# Patient Record
Sex: Male | Born: 1985 | Hispanic: Yes | Marital: Single | Smoking: Never smoker
Health system: Southern US, Community
[De-identification: ages and names within clinical notes are randomized; demographics above are authoritative.]

## PROBLEM LIST (undated history)

## (undated) DIAGNOSIS — I10 Essential (primary) hypertension: Secondary | ICD-10-CM

## (undated) HISTORY — DX: Essential (primary) hypertension: I10

---

## 2011-02-16 HISTORY — PX: CHOLECYSTECTOMY: SHX55

## 2012-10-10 ENCOUNTER — Encounter (INDEPENDENT_AMBULATORY_CARE_PROVIDER_SITE_OTHER): Payer: Self-pay | Admitting: Surgery

## 2013-08-22 ENCOUNTER — Encounter: Payer: Self-pay | Admitting: *Deleted

## 2013-08-22 ENCOUNTER — Ambulatory Visit (INDEPENDENT_AMBULATORY_CARE_PROVIDER_SITE_OTHER): Payer: Self-pay | Admitting: Family Medicine

## 2013-08-22 VITALS — BP 114/72 | HR 66 | Temp 97.6°F | Resp 16 | Ht 70.5 in | Wt 165.0 lb

## 2013-08-22 DIAGNOSIS — I10 Essential (primary) hypertension: Secondary | ICD-10-CM | POA: Insufficient documentation

## 2013-08-22 MED ORDER — BISOPROLOL-HYDROCHLOROTHIAZIDE 5-6.25 MG PO TABS: 1.0000 | ORAL_TABLET | Freq: Every day | ORAL | Status: DC

## 2013-08-22 NOTE — Progress Notes (Signed)
[facility] [address], [place] [address] [phone]  Date:  08/22/2013   Name:  Garrett Miller   DOB:  Dec 13, 1985   MRN:  [id number]  PCP:  No PCP Per Patient    Chief Complaint: rx refills   History of Present Illness:  Garrett Miller is a 28 y.o. very pleasant male patient who presents with the following:  He needs a rf of his BP medication- he has been on it for about 3 years.  He does not really check his BP at home. He has been out for about 3 days now, but he still feels like it is working ok.   He does not really have a doctor.  No recent BW  There are no active problems to display for this patient.   Past Medical History  Diagnosis Date  . Hypertension     Past Surgical History  Procedure Laterality Date  . Cholecystectomy      History  Substance Use Topics  . Smoking status: Never Smoker   . Smokeless tobacco: Not on file  . Alcohol Use: No    Family History  Problem Relation Age of Onset  . Hypertension Mother     No Known Allergies  Medication list has been reviewed and updated.  No current outpatient prescriptions on file prior to visit.   No current facility-administered medications on file prior to visit.    Review of Systems:  As per HPI- otherwise negative. Mother with history of HTN  Physical Examination: Filed Vitals:   08/22/13 1557  BP: 114/72  Pulse: 66  Temp: 97.6 F (36.4 C)  Resp: 16   Filed Vitals:   08/22/13 1557  Height: 5' 10.5" (1.791 m)  Weight: 165 lb (74.844 kg)   Body mass index is 23.33 kg/(m^2). Ideal Body Weight: Weight in (lb) to have BMI = 25: 176.4  GEN: WDWN, NAD, Non-toxic, A & O x 3 HEENT: Atraumatic, Normocephalic. Neck supple. No masses, No LAD. Ears and Nose: No external deformity. CV: RRR, No M/G/R. No JVD. No thrill. No extra heart sounds. PULM: CTA B, no wheezes, crackles, rhonchi. No retractions. No resp. distress. No accessory muscle use. EXTR: No c/c/e NEURO  Normal gait.  PSYCH: Normally interactive. Conversant. Not depressed or anxious appearing.  Calm demeanor.    Assessment and Plan: Essential hypertension - Plan: bisoprolol-hydrochlorothiazide (ZIAC) 5-6.25 MG per tablet  Refilled his HTN medication.  Per his report he will have health insurance later on this month- defer labs, but he will come back soon  Signed Abbe AmsterdamJessica Copland, MD

## 2013-08-22 NOTE — Patient Instructions (Signed)
Follow- up for a visit in 2 months or so- we can do labs then and check on your BP

## 2013-09-16 ENCOUNTER — Ambulatory Visit (INDEPENDENT_AMBULATORY_CARE_PROVIDER_SITE_OTHER): Payer: BC Managed Care – PPO

## 2013-09-16 ENCOUNTER — Ambulatory Visit (INDEPENDENT_AMBULATORY_CARE_PROVIDER_SITE_OTHER): Payer: BC Managed Care – PPO | Admitting: Emergency Medicine

## 2013-09-16 VITALS — BP 112/70 | HR 54 | Temp 97.6°F | Resp 20 | Ht 70.5 in | Wt 166.0 lb

## 2013-09-16 DIAGNOSIS — R079 Chest pain, unspecified: Secondary | ICD-10-CM

## 2013-09-16 DIAGNOSIS — I10 Essential (primary) hypertension: Secondary | ICD-10-CM

## 2013-09-16 MED ORDER — NAPROXEN SODIUM 550 MG PO TABS: 550.0000 mg | ORAL_TABLET | Freq: Two times a day (BID) | ORAL | Status: DC

## 2013-09-16 NOTE — Progress Notes (Signed)
[facility] [address], [place] [address] [phone]  Date:  09/16/2013   Name:  Garrett Miller   DOB:  14-Jul-1985   MRN:  [id number]  PCP:  No PCP Per Patient    Chief Complaint: Chest Pain and Back Pain   History of Present Illness:  Garrett Primassteban Joa is a 28 y.o. very pleasant male patient who presents with the following:  No history of injury.  Has 1 week duration pain in right chest both anteriorly and posteriorly.  Worse with movement of his arm.  No cough or hemoptysis.  No fever or chills, wheezing or shortness of breath.  No palpitations or tachycardia. No radiation of pain No nausea or vomiting.   No improvement with over the counter medications or other home remedies. Fredna Dow.  Says a doctor told him bistolic would "cause" a heart attack. Denies other complaint or health concern today.   Patient Active Problem List   Diagnosis Date Noted  . HTN (hypertension) 08/22/2013    Past Medical History  Diagnosis Date  . Hypertension     Past Surgical History  Procedure Laterality Date  . Cholecystectomy      History  Substance Use Topics  . Smoking status: Never Smoker   . Smokeless tobacco: Never Used  . Alcohol Use: No    Family History  Problem Relation Age of Onset  . Hypertension Mother     No Known Allergies  Medication list has been reviewed and updated.  Current Outpatient Prescriptions on File Prior to Visit  Medication Sig Dispense Refill  . bisoprolol-hydrochlorothiazide (ZIAC) 5-6.25 MG per tablet Take 1 tablet by mouth daily.  90 tablet  3   No current facility-administered medications on file prior to visit.    Review of Systems:  As per HPI, otherwise negative.    Physical Examination: Filed Vitals:   09/16/13 1607  BP: 112/70  Pulse: 54  Temp: 97.6 F (36.4 C)  Resp: 20   Filed Vitals:   09/16/13 1607  Height: 5' 10.5" (1.791 m)  Weight: 166 lb (75.297 kg)   Body mass index is 23.47 kg/(m^2). Ideal Body  Weight: Weight in (lb) to have BMI = 25: 176.4   GEN: WDWN, NAD, Non-toxic, Alert & Oriented x 3 HEENT: Atraumatic, Normocephalic.  Ears and Nose: No external deformity. EXTR: No clubbing/cyanosis/edema NEURO: Normal gait.  PSYCH: Normally interactive. Conversant. Not depressed or anxious appearing.  Calm demeanor.  CHEST:  Little tenderness.  No ecchymosis. LUNG:  Clear BS =  Assessment and Plan: Chest pain likely musculoskeletal Normal EKG  Signed,  Phillips OdorJeffery Tykeem Lanzer, MD  [facility] reading (PRIMARY) by  Dr. Dareen PianoAnderson.  Negative chest.

## 2013-09-16 NOTE — Patient Instructions (Signed)
Dolor de pecho (no específico) °(Chest Pain (Nonspecific)) °Con frecuencia es difícil dar un diagnóstico específico de la causa del dolor de pecho. Siempre hay una posibilidad de que el dolor podría estar relacionado con algo grave, como un ataque al corazón o un coágulo sanguíneo en los pulmones. Debe someterse a controles con el médico para más evaluaciones. °CAUSAS  °· Acidez. °· Neumonía o bronquitis. °· Ansiedad o estrés. °· Inflamación de la zona que rodea al corazón (pericarditis) o a los pulmones (pleuritis o pleuresía). °· Un coágulo sanguíneo en el pulmón. °· Colapso de un pulmón (neumotórax), que puede aparecer de manera repentina por sí solo (neumotórax espontáneo) o debido a un traumatismo en el tórax. °· Culebrilla (virus del herpes zóster). °La pared torácica está compuesta por huesos, músculos y cartílago. Cualquiera de estos puede ser la fuente del dolor. °· Puede haber una contusión en los huesos debido a una lesión. °· Puede haber un esguince en los músculos o el cartílago ocasionado por la tos o por trabajo excesivo. °· El cartílago puede verse afectado por una inflamación y provocar dolor (costocondritis). °DIAGNÓSTICO  °Quizás se necesiten análisis de laboratorio u otros estudios para encontrar la causa del dolor. Además, puede indicarle que se haga una prueba llamada electrocadiograma (ECG) ambulatorio. El ECG registra los patrones de los latidos cardíacos durante 24 horas. Además, pueden hacerle otros estudios, por ejemplo: °· Ecocardiograma transtorácico (ETT). Durante el ecocardiograma, se usan ondas sonoras para evaluar el flujo de la sangre a través del corazón. °· Ecocardiograma transesofágico (ETE). °· Monitoreo cardíaco. Permite que el médico controle la frecuencia y el ritmo cardíaco en tiempo real. °· Monitor Holter. Es un dispositivo portátil que registra los latidos cardíacos y ayuda a diagnosticar las arritmias cardíacas. Le permite al médico registrar la actividad cardíaca  durante varios días, si es necesario. °· Pruebas de estrés por ejercicio o por medicamentos que aceleran los latidos cardíacos. °TRATAMIENTO  °· El tratamiento depende de la causa del dolor de pecho. El tratamiento puede incluir: °¨ Inhibidores de la acidez estomacal. °¨ Antiinflamatorios. °¨ Analgésicos para las enfermedades inflamatorias. °¨ Antibióticos, si hay una infección. °· Podrán aconsejarle que modifique su estilo de vida. Esto incluye dejar de fumar y evitar el alcohol, la cafeína y el chocolate. °· Pueden aconsejarle que mantenga la cabeza levantada (elevada) cuando duerme. Esto reduce la probabilidad de que el ácido retroceda del estómago al esófago. °En la mayoría de los casos, el dolor de pecho no específico mejorará en el término de 2 a 3 días, con reposo y analgésicos suaves.  °INSTRUCCIONES PARA EL CUIDADO EN EL HOGAR  °· Si le prescriben antibióticos, tómelos tal como se le indicó. Termínelos aunque comience a sentirse mejor. °· Durante los días siguientes, no haga actividades físicas que provoquen dolor de pecho. Continúe con las actividades físicas tal como se le indicó °· No consuma ningún producto que contenga tabaco, incluidos cigarrillos, tabaco de mascar o cigarrillos electrónicos. °· Evite el consumo de alcohol. °· Tome los medicamentos solamente como se lo haya indicado el médico. °· Siga las sugerencias del médico en lo que respecta a las pruebas adicionales, si el dolor de pecho no desaparece. °· Concurra a todas las visitas de control programadas. Si no lo hace, podría desarrollar problemas permanentes (crónicos) relacionados con el dolor. Si hay algún problema para concurrir a una cita, llame para reprogramarla. °SOLICITE ATENCIÓN MÉDICA SI:  °· El dolor de pecho no desaparece, incluso después del tratamiento. °· Tiene una erupción cutánea con ampollas en el   pecho. °· Tiene fiebre. °SOLICITE ATENCIÓN MÉDICA DE INMEDIATO SI:  °· Aumenta el dolor de pecho o este se irradia hacia el  brazo, el cuello, la mandíbula, la espalda o el abdomen. °· Le falta el aire. °· La tos empeora, o expectora sangre. °· Siente dolor intenso en la espalda o el abdomen. °· Se siente nauseoso o vomita. °· Siente debilidad intensa. °· Se desmaya. °· Tiene escalofríos. °Esto es una emergencia. No espere a ver si el dolor se pasa. Obtenga ayuda médica de inmediato. Llame a los servicios de emergencia locales (911 en los [place]). No conduzca por sus propios medios hasta el hospital. °ASEGÚRESE DE QUE:  °· Comprende estas instrucciones. °· Controlará su afección. °· Recibirá ayuda de inmediato si no mejora o si empeora. °Document Released: 02/01/2005 Document Revised: 02/06/2013 °ExitCare® Patient Information ©2015 [employer]. This information is not intended to replace advice given to you by your health care provider. Make sure you discuss any questions you have with your health care provider. ° °

## 2013-09-20 ENCOUNTER — Ambulatory Visit (INDEPENDENT_AMBULATORY_CARE_PROVIDER_SITE_OTHER): Payer: BC Managed Care – PPO | Admitting: Family Medicine

## 2013-09-20 VITALS — BP 132/80 | HR 52 | Temp 98.0°F | Resp 17 | Ht 70.5 in | Wt 165.0 lb

## 2013-09-20 DIAGNOSIS — R001 Bradycardia, unspecified: Secondary | ICD-10-CM

## 2013-09-20 DIAGNOSIS — I498 Other specified cardiac arrhythmias: Secondary | ICD-10-CM

## 2013-09-20 DIAGNOSIS — S29011D Strain of muscle and tendon of front wall of thorax, subsequent encounter: Secondary | ICD-10-CM

## 2013-09-20 DIAGNOSIS — R0789 Other chest pain: Secondary | ICD-10-CM

## 2013-09-20 DIAGNOSIS — Z5189 Encounter for other specified aftercare: Secondary | ICD-10-CM

## 2013-09-20 DIAGNOSIS — I10 Essential (primary) hypertension: Secondary | ICD-10-CM

## 2013-09-20 DIAGNOSIS — IMO0002 Reserved for concepts with insufficient information to code with codable children: Secondary | ICD-10-CM

## 2013-09-20 LAB — POCT CBC
Granulocyte percent: 53.2 %G (ref 37–80)
HCT, POC: 47.9 % (ref 43.5–53.7)
Hemoglobin: 15.7 g/dL (ref 14.1–18.1)
LYMPH, POC: 3.1 (ref 0.6–3.4)
MCH: 30.7 pg (ref 27–31.2)
MCHC: 32.7 g/dL (ref 31.8–35.4)
MCV: 93.7 fL (ref 80–97)
MID (cbc): 0.3 (ref 0–0.9)
MPV: 7.5 fL (ref 0–99.8)
PLATELET COUNT, POC: 198 10*3/uL (ref 142–424)
POC GRANULOCYTE: 3.8 (ref 2–6.9)
POC LYMPH PERCENT: 43.1 %L (ref 10–50)
POC MID %: 3.7 % (ref 0–12)
RBC: 5.11 M/uL (ref 4.69–6.13)
RDW, POC: 12.7 %
WBC: 7.1 10*3/uL (ref 4.6–10.2)

## 2013-09-20 MED ORDER — METHOCARBAMOL 500 MG PO TABS: 500.0000 mg | ORAL_TABLET | Freq: Every evening | ORAL | Status: DC | PRN

## 2013-09-20 NOTE — Progress Notes (Addendum)
Subjective:   This chart was scribed for Ethelda ChickKristi M Smith, MD by Arlan OrganAshley Leger, [facility] Scribe. This patient was seen in room 13 and the patient's care was started 9:37 PM.    Patient ID: Garrett Miller, male    DOB: 1985/09/18, 28 y.o.   MRN: [id number]  09/20/2013  Chest Pain   HPI  HPI Comments: Garrett Miller is a 28 y.o. male who [facility] complaining of intermittent, moderate L sided CP that radiates to the back x 4 days that has progressively worsened. Discomfort is described as sharp with episodes lasting a few minutes. This pain does not wake him from sleep. Pain is exacerbated with stretching, deep breathing, exertion, heavy lifting, ambulation, and lifting. No alleviating factors at this time. 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 He denies any fever, chills, cough, SOB, congestion, nausea, diaphoresis, chills, or rhinorrhea. Pt was seen here 8/2 for same. Chest X-ray and EKG were performed without any abnormal acute findings. Pt was also prescribed Naproxen at discharge without any noticeable improvement.   Both parents are living. Mother has a history of high blood pressure and father is without any medical issues.   He denies any smoking habits or alcohol use. Denies a current exercise plan. No family history of heart issues or blood clots. No recent surgeries. He denies any recent long distance travel. No known allergies to medications. No other concerns this visit.  Review of Systems  Constitutional: Negative for fever, chills and diaphoresis.  HENT: Negative for congestion, postnasal drip, rhinorrhea and sore throat.   Respiratory: Negative for cough, shortness of breath and stridor.   Cardiovascular: Positive for chest pain.  Gastrointestinal: Negative for nausea, vomiting, abdominal pain and diarrhea.  Skin: Negative for rash.    Past Medical History  Diagnosis Date  . Hypertension     Past Surgical History  Procedure Laterality Date  . Cholecystectomy     No Known Allergies Current Outpatient Prescriptions  Medication Sig Dispense Refill  . bisoprolol-hydrochlorothiazide (ZIAC) 5-6.25 MG per tablet Take 1 tablet by mouth daily.  90 tablet  3  . naproxen sodium (ANAPROX DS) 550 MG tablet Take 1 tablet (550 mg total) by mouth 2 (two) times daily with a meal.  40 tablet  0  . methocarbamol (ROBAXIN) 500 MG tablet Take 1-2 tablets (500-1,000 mg total) by mouth at bedtime as needed for muscle spasms.  40 tablet  0   No current facility-administered medications for this visit.   History   Social History  . Marital Status: Single    Spouse Name: N/A    Number of Children: N/A  . Years of Education: N/A   Occupational History  . Not on file.   Social History Main Topics  . Smoking status: Never Smoker   . Smokeless tobacco: Never Used  . Alcohol Use: No  . Drug Use: No  . Sexual Activity: Not on file   Other Topics Concern  . Not on file   Social History Narrative  . No narrative on file   Family History  Problem Relation Age of Onset  . Hypertension Mother         Objective:    BP 132/80  Pulse 52  Temp(Src) 98 F (36.7 C) (Oral)  Resp 17  Ht 5' 10.5" (1.791 m)  Wt 165 lb (74.844 kg)  BMI 23.33 kg/m2  SpO2 99% Physical Exam  Nursing  note and vitals reviewed. Constitutional: He is oriented to person, place, and time. He appears well-developed and well-nourished. No distress.  HENT:  Head: Normocephalic and atraumatic.  Right Ear: External ear normal.  Left Ear: External ear normal.  Nose: Nose normal.  Mouth/Throat: Oropharynx is clear and moist.  Eyes: Conjunctivae and EOM are normal. Pupils are equal, round, and reactive to light.  Neck: Normal range of motion. Neck supple. Carotid bruit is not present. No thyromegaly present.  Cardiovascular: Normal rate, regular rhythm, normal heart sounds and intact distal pulses.  Exam reveals no  gallop and no friction rub.   No murmur heard. Pulmonary/Chest: Effort normal and breath sounds normal. He has no wheezes. He has no rales. He exhibits tenderness.  Tenderness to palpation over L anterior chest wall  Abdominal: Soft. Bowel sounds are normal. He exhibits no distension and no mass. There is no tenderness. There is no rebound and no guarding.  Musculoskeletal: Normal range of motion.  Lymphadenopathy:    He has no cervical adenopathy.  Neurological: He is alert and oriented to person, place, and time. No cranial nerve deficit.  Skin: Skin is warm and dry. No rash noted. He is not diaphoretic.  Psychiatric: He has a normal mood and affect. His behavior is normal.   Results for orders placed in visit on 09/20/13  TSH      Result Value Ref Range   TSH 1.582  0.350 - 4.500 uIU/mL  POCT CBC      Result Value Ref Range   WBC 7.1  4.6 - 10.2 K/uL   Lymph, poc 3.1  0.6 - 3.4   POC LYMPH PERCENT 43.1  10 - 50 %L   MID (cbc) 0.3  0 - 0.9   POC MID % 3.7  0 - 12 %M   POC Granulocyte 3.8  2 - 6.9   Granulocyte percent 53.2  37 - 80 %G   RBC 5.11  4.69 - 6.13 M/uL   Hemoglobin 15.7  14.1 - 18.1 g/dL   HCT, POC 16.1  09.6 - 53.7 %   MCV 93.7  80 - 97 fL   MCH, POC 30.7  27 - 31.2 pg   MCHC 32.7  31.8 - 35.4 g/dL   RDW, POC 04.5     Platelet Count, POC 198  142 - 424 K/uL   MPV 7.5  0 - 99.8 fL   EKG: sinus bradycardia; no ST changes.  Rate 45.    Assessment & Plan:   1. Other chest pain   2. Bradycardia   3. Essential hypertension, benign   4. Chest wall muscle strain, subsequent encounter    1. Chest pain: New to this provider; stable EKG without acute abnormalities.  Consistent with chest wall strain from heavy lifting.  Pt very concerned about cardiac etiology and desires cardiology consultation. Agreeable to cardiology consultation. S/p CXR at recent visit that was negative. 2.  Bradycardia:  New. Normal TSH.  Asymptomatic.  Cardiology to review. 3.  HTN: controlled  at this time. 4. Chest wall strain: New. Recommend daily stretching; recommend continued use of Naproxen; add Robaxin for qhs use.  Avoid heavy lifting as much as possible.  Meds ordered this encounter  Medications  . methocarbamol (ROBAXIN) 500 MG tablet    Sig: Take 1-2 tablets (500-1,000 mg total) by mouth at bedtime as needed for muscle spasms.    Dispense:  40 tablet    Refill:  0    No Follow-up  on file.    I personally performed the services described in this documentation, which was scribed in my presence. The recorded information has been reviewed and is accurate.   Nilda Simmer, M.D.  [facility] [address] [place], [place]  [address] [phone] phone [phone] fax

## 2013-09-20 NOTE — Patient Instructions (Signed)

## 2013-09-21 LAB — TSH: TSH: 1.582 u[IU]/mL (ref 0.350–4.500)

## 2013-11-15 ENCOUNTER — Institutional Professional Consult (permissible substitution): Payer: Self-pay | Admitting: Cardiology

## 2013-12-01 ENCOUNTER — Encounter: Payer: Self-pay | Admitting: Family Medicine

## 2013-12-01 ENCOUNTER — Ambulatory Visit (INDEPENDENT_AMBULATORY_CARE_PROVIDER_SITE_OTHER): Payer: BC Managed Care – PPO | Admitting: Family Medicine

## 2013-12-01 VITALS — BP 108/62 | HR 54 | Temp 98.2°F | Resp 16 | Ht 71.0 in | Wt 165.0 lb

## 2013-12-01 DIAGNOSIS — I10 Essential (primary) hypertension: Secondary | ICD-10-CM

## 2013-12-01 DIAGNOSIS — S29011D Strain of muscle and tendon of front wall of thorax, subsequent encounter: Secondary | ICD-10-CM

## 2013-12-01 DIAGNOSIS — R350 Frequency of micturition: Secondary | ICD-10-CM

## 2013-12-01 DIAGNOSIS — R300 Dysuria: Secondary | ICD-10-CM

## 2013-12-01 LAB — POCT UA - MICROSCOPIC ONLY
Bacteria, U Microscopic: NEGATIVE
CRYSTALS, UR, HPF, POC: NEGATIVE
Casts, Ur, LPF, POC: NEGATIVE
Epithelial cells, urine per micros: NEGATIVE
Mucus, UA: NEGATIVE
RBC, URINE, MICROSCOPIC: NEGATIVE
WBC, UR, HPF, POC: NEGATIVE
Yeast, UA: NEGATIVE

## 2013-12-01 LAB — CBC WITH DIFFERENTIAL/PLATELET
BASOS ABS: 0 10*3/uL (ref 0.0–0.1)
BASOS PCT: 0 % (ref 0–1)
EOS PCT: 1 % (ref 0–5)
Eosinophils Absolute: 0.1 10*3/uL (ref 0.0–0.7)
HEMATOCRIT: 45.5 % (ref 39.0–52.0)
Hemoglobin: 16.2 g/dL (ref 13.0–17.0)
Lymphocytes Relative: 35 % (ref 12–46)
Lymphs Abs: 2 10*3/uL (ref 0.7–4.0)
MCH: 32.1 pg (ref 26.0–34.0)
MCHC: 35.6 g/dL (ref 30.0–36.0)
MCV: 90.1 fL (ref 78.0–100.0)
MONO ABS: 0.5 10*3/uL (ref 0.1–1.0)
Monocytes Relative: 8 % (ref 3–12)
NEUTROS ABS: 3.2 10*3/uL (ref 1.7–7.7)
Neutrophils Relative %: 56 % (ref 43–77)
Platelets: 228 10*3/uL (ref 150–400)
RBC: 5.05 MIL/uL (ref 4.22–5.81)
RDW: 12.9 % (ref 11.5–15.5)
WBC: 5.7 10*3/uL (ref 4.0–10.5)

## 2013-12-01 LAB — COMPREHENSIVE METABOLIC PANEL
ALK PHOS: 70 U/L (ref 39–117)
ALT: 42 U/L (ref 0–53)
AST: 27 U/L (ref 0–37)
Albumin: 4.6 g/dL (ref 3.5–5.2)
BUN: 18 mg/dL (ref 6–23)
CO2: 29 mEq/L (ref 19–32)
CREATININE: 0.82 mg/dL (ref 0.50–1.35)
Calcium: 9.7 mg/dL (ref 8.4–10.5)
Chloride: 103 mEq/L (ref 96–112)
Glucose, Bld: 78 mg/dL (ref 70–99)
POTASSIUM: 4.2 meq/L (ref 3.5–5.3)
Sodium: 137 mEq/L (ref 135–145)
Total Bilirubin: 0.5 mg/dL (ref 0.2–1.2)
Total Protein: 7.6 g/dL (ref 6.0–8.3)

## 2013-12-01 LAB — POCT URINALYSIS DIPSTICK
BILIRUBIN UA: NEGATIVE
Blood, UA: NEGATIVE
Glucose, UA: NEGATIVE
Ketones, UA: NEGATIVE
LEUKOCYTES UA: NEGATIVE
Nitrite, UA: NEGATIVE
PROTEIN UA: NEGATIVE
Spec Grav, UA: 1.015
Urobilinogen, UA: 0.2
pH, UA: 7

## 2013-12-01 MED ORDER — CIPROFLOXACIN HCL 500 MG PO TABS: 500.0000 mg | ORAL_TABLET | Freq: Two times a day (BID) | ORAL | Status: DC

## 2013-12-01 NOTE — Progress Notes (Signed)
Subjective:    Patient ID: Garrett Miller, male    DOB: [birth year], 28 y.o.   MRN: [id number]  Urinary Tract Infection  Associated symptoms include frequency. Pertinent negatives include no chills, urgency or vomiting.   Chief Complaint  Patient presents with  . Urinary Tract Infection    x 20 days    This chart was scribed for Ethelda ChickKristi M Ayeza Therriault, MD, MD by Andrew Auaven Small, ED Scribe. This patient was seen in room 12 and the patient's care was started at 11:03 AM.  HPI Comments: Garrett Miller is a 28 y.o. male who [facility] complaining of UTI x  3 weeks. Pt reports associated dysuria that began 4 days ago and frequency for the past 3 weeks. Pt urinates 3-4 times during the night which is new in the past month; baseline urination at night is 1-2 times. Pt drinks 1 bottle a water after 7 pm but usually drinks 2 liters of fluid per day. Pt reports hx UTI but has not had one in several years.  Pt denies fever, chills, nausea, emesis, diarrhea, abdominal pain, penile discharge.    Same girlfriend for ten years; currently live together.  History of prostate exam one year ago; was told that prostate was swollen; prescribed medication but never took it.   Pt was seen by me 2 months ago for CP. He reports he stills has intermittent CP that he believes is caused from work. Pt has been  taking naproxen with relief to CP. During last visit pt was referred to cardiologist but did not receive a call to set an appointment. At this time pt denies wanting to be seen by cardiologist. Now declining referral to cardiology; feels pain due to muscular strain.  Due for CMET for hypertension.  Feels well.    Past Medical History  Diagnosis Date  . Hypertension    Past Surgical History  Procedure Laterality Date  . Cholecystectomy     Prior to Admission medications   Medication Sig Start Date End Date Taking? Authorizing Provider  bisoprolol-hydrochlorothiazide (ZIAC) 5-6.25 MG  per tablet Take 1 tablet by mouth daily. 08/22/13  Yes Pearline CablesJessica C Copland, MD   Review of Systems  Constitutional: Negative for fever and chills.  Respiratory: Negative for shortness of breath.   Cardiovascular: Positive for chest pain.  Gastrointestinal: Negative for vomiting, abdominal pain and diarrhea.  Genitourinary: Positive for dysuria and frequency. Negative for urgency, discharge, difficulty urinating and penile pain.    Objective:   Physical Exam  Nursing note and vitals reviewed. Constitutional: He is oriented to person, place, and time. He appears well-developed and well-nourished. No distress.  HENT:  Head: Normocephalic and atraumatic.  Eyes: Conjunctivae and EOM are normal.  Neck: Neck supple.  Cardiovascular: Normal rate.   Pulmonary/Chest: Effort normal.  Abdominal: Soft. Bowel sounds are normal. He exhibits no distension and no mass. There is no tenderness. There is no rebound and no guarding. Hernia confirmed negative in the right inguinal area and confirmed negative in the left inguinal area.  Genitourinary: Rectum normal, prostate normal, testes normal and penis normal. Prostate is not enlarged and not tender. Right testis shows no mass, no swelling and no tenderness. Left testis shows no mass, no swelling and no tenderness. No phimosis, penile erythema or penile tenderness. No discharge found.  Musculoskeletal: Normal range of motion.  Lymphadenopathy:       Right: No inguinal adenopathy present.       Left:  No inguinal adenopathy present.  Neurological: He is alert and oriented to person, place, and time.  Skin: Skin is warm and dry.  Psychiatric: He has a normal mood and affect. His behavior is normal.    Results for orders placed in visit on 12/01/13  POCT UA - MICROSCOPIC ONLY      Result Value Ref Range   WBC, Ur, HPF, POC neg     RBC, urine, microscopic neg     Bacteria, U Microscopic neg     Mucus, UA neg     Epithelial cells, urine per micros neg      Crystals, Ur, HPF, POC neg     Casts, Ur, LPF, POC neg     Yeast, UA neg    POCT URINALYSIS DIPSTICK      Result Value Ref Range   Color, UA yellow     Clarity, UA clear     Glucose, UA neg     Bilirubin, UA neg     Ketones, UA neg     Spec Grav, UA 1.015     Blood, UA neg     pH, UA 7.0     Protein, UA neg     Urobilinogen, UA 0.2     Nitrite, UA neg     Leukocytes, UA Negative      Assessment & Plan:  Urinary frequency - Plan: POCT UA - Microscopic Only, POCT urinalysis dipstick, Urine culture, GC/Chlamydia Probe Amp, PSA  Dysuria - Plan: POCT UA - Microscopic Only, POCT urinalysis dipstick, Urine culture, GC/Chlamydia Probe Amp, ciprofloxacin (CIPRO) 500 MG tablet  Essential hypertension - Plan: CBC with Differential, Comprehensive metabolic panel  Chest wall muscle strain, subsequent encounter   1.  Dysuria/urinary Frequency/Nocturia:  New. Onset three weeks ago.  Send urine culture and uriprobe.  Treat empirically with Cipro for two weeks.  If no improvement in two weeks, to call office for urology referral. 2.  HTN: controlled; obtain CMET. No change in medications. 3. Chest pain/chest wall strain: persistent but now intermittent.  Declined referral to cardiology.   I personally performed the services described in this documentation, which was scribed in my presence. The recorded information has been reviewed and is accurate.   Nilda SimmerKristi Latoy Labriola, M.D.  [facility]  [place] [address] [place], [place]  [address] [phone] phone [phone] fax

## 2013-12-01 NOTE — Patient Instructions (Signed)
1.  CALL IN TWO WEEKS IF URINARY FREQUENCY HAS NOT GREATLY IMPROVED.

## 2013-12-02 LAB — URINE CULTURE
COLONY COUNT: NO GROWTH
Organism ID, Bacteria: NO GROWTH

## 2013-12-03 LAB — GC/CHLAMYDIA PROBE AMP
CT Probe RNA: NEGATIVE
GC Probe RNA: NEGATIVE

## 2013-12-03 LAB — PSA: PSA: 0.24 ng/mL (ref <=4.00)

## 2013-12-15 ENCOUNTER — Telehealth: Payer: Self-pay

## 2013-12-15 DIAGNOSIS — R350 Frequency of micturition: Secondary | ICD-10-CM

## 2013-12-15 NOTE — Telephone Encounter (Signed)
Pt's wife came in and states that he called and placed a phone message and Dr Katrinka BlazingSmith has not returned the call. He decided he does want to go to a Urologist. He can be contacted at [phone]. Thank you

## 2013-12-17 NOTE — Telephone Encounter (Signed)
Order placed for urology referral.  Please call and advise pt.

## 2013-12-17 NOTE — Telephone Encounter (Signed)
LM to advise pt. 

## 2013-12-19 ENCOUNTER — Ambulatory Visit (INDEPENDENT_AMBULATORY_CARE_PROVIDER_SITE_OTHER): Payer: BC Managed Care – PPO | Admitting: Family Medicine

## 2013-12-19 VITALS — BP 126/70 | HR 56 | Temp 98.5°F | Resp 16 | Ht 70.0 in | Wt 161.0 lb

## 2013-12-19 DIAGNOSIS — M791 Myalgia, unspecified site: Secondary | ICD-10-CM

## 2013-12-19 DIAGNOSIS — R350 Frequency of micturition: Secondary | ICD-10-CM

## 2013-12-19 DIAGNOSIS — M545 Low back pain, unspecified: Secondary | ICD-10-CM

## 2013-12-19 DIAGNOSIS — G8929 Other chronic pain: Secondary | ICD-10-CM

## 2013-12-19 DIAGNOSIS — R079 Chest pain, unspecified: Secondary | ICD-10-CM

## 2013-12-19 DIAGNOSIS — R1010 Upper abdominal pain, unspecified: Secondary | ICD-10-CM

## 2013-12-19 LAB — BASIC METABOLIC PANEL
BUN: 13 mg/dL (ref 6–23)
CALCIUM: 9.6 mg/dL (ref 8.4–10.5)
CO2: 27 mEq/L (ref 19–32)
Chloride: 102 mEq/L (ref 96–112)
Creat: 0.95 mg/dL (ref 0.50–1.35)
GLUCOSE: 84 mg/dL (ref 70–99)
Potassium: 4.2 mEq/L (ref 3.5–5.3)
SODIUM: 139 meq/L (ref 135–145)

## 2013-12-19 LAB — POCT CBC
Granulocyte percent: 63.4 %G (ref 37–80)
HCT, POC: 49 % (ref 43.5–53.7)
Hemoglobin: 16.3 g/dL (ref 14.1–18.1)
LYMPH, POC: 1.8 (ref 0.6–3.4)
MCH, POC: 31.7 pg — AB (ref 27–31.2)
MCHC: 33.2 g/dL (ref 31.8–35.4)
MCV: 95.2 fL (ref 80–97)
MID (CBC): 0.2 (ref 0–0.9)
MPV: 7.5 fL (ref 0–99.8)
POC Granulocyte: 3.4 (ref 2–6.9)
POC LYMPH %: 32.5 % (ref 10–50)
POC MID %: 4.1 % (ref 0–12)
Platelet Count, POC: 215 10*3/uL (ref 142–424)
RBC: 5.15 M/uL (ref 4.69–6.13)
RDW, POC: 12.8 %
WBC: 5.4 10*3/uL (ref 4.6–10.2)

## 2013-12-19 LAB — CK: Total CK: 130 U/L (ref 7–232)

## 2013-12-19 MED ORDER — TAMSULOSIN HCL 0.4 MG PO CAPS: 0.4000 mg | ORAL_CAPSULE | Freq: Every day | ORAL | Status: DC

## 2013-12-19 NOTE — Progress Notes (Signed)
[facility] [address], [place] [address] [phone]  Date:  12/19/2013   Name:  Garrett Miller   DOB:  [birth year]   MRN:  [id number]  PCP:  No PCP Per Patient    Chief Complaint: Follow-up and Back Pain   History of Present Illness:  Garrett Miller is a 28 y.o. very pleasant male patient who presents with the following:  Here today to recheck urinary sx and also to discuss back pain.  He was seen here about 2 weeks ago with complaint of possible UTI sx for 3 weeks.  He has noted increased nocturia.  He had a normal genital and prostate exam.  Prostate not tender or enlarged.  Urine clear; tested for gonorrhea and chlamydia.  He was treated with cipro for 2 weeks.  Negative uriprobe and negative urine culture.    He is here today with "body pain, chest pain, back pain, and burning when he urinates."  He notes he is still has to get up 4-5 times a night to urinate.  Garrett lower back has been hurting for about one week.  He is worried about Garrett kidneys.    He has noted chest pain now for 3-4 months. He had previously declined a cardiology appt but now would like to go ahead and do this.  He may notice the chest pain off an on.  He is not aware of any pattern.  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  It is not bothering him currently.  No history of CAD, no family history of heart trouble.    Garrett GF translates that "everything hurts from Garrett groin to Garrett neck, front and back my whole body."  He does not feel that he is depressed, and denies any other particular stressors or changes.  However then admits that he and Garrett GF do have an infant at home.    He has been on Garrett current BP medication for several years- no change.    He did have Garrett gallbladder removed a couple of years ago.   He feels like he has a stitch in Garrett side.    Patient Active  Problem List   Diagnosis Date Noted  . HTN (hypertension) 08/22/2013    Past Medical History  Diagnosis Date  . Hypertension     Past Surgical History  Procedure Laterality Date  . Cholecystectomy      History  Substance Use Topics  . Smoking status: Never Smoker   . Smokeless tobacco: Never Used  . Alcohol Use: No    Family History  Problem Relation Age of Onset  . Hypertension Mother     No Known Allergies  Medication list has been reviewed and updated.  Current Outpatient Prescriptions on File Prior to Visit  Medication Sig Dispense Refill  . bisoprolol-hydrochlorothiazide (ZIAC) 5-6.25 MG per tablet Take 1 tablet by mouth daily. 90 tablet 3   No current facility-administered medications on file prior to visit.    Review of Systems:  As per HPI- otherwise negative.   Physical Examination: Filed Vitals:   12/19/13 0848  BP: 126/70  Pulse: 56  Temp: 98.5 F (36.9 C)  Resp: 16   Filed Vitals:   12/19/13 0848  Height: 5\' 10"  (1.778 m)  Weight: 161 lb (73.029 kg)   Body mass index  is 23.1 kg/(m^2). Ideal Body Weight: Weight in (lb) to have BMI = 25: 173.9  GEN: WDWN, NAD, Non-toxic, A & O x 3, looks well, here today with Garrett Miller.   HEENT: Atraumatic, Normocephalic. Neck supple. No masses, No LAD. Ears and Nose: No external deformity. CV: RRR, No M/G/R. No JVD. No thrill. No extra heart sounds. PULM: CTA B, no wheezes, crackles, rhonchi. No retractions. No resp. distress. No accessory muscle use. ABD: S, NT, ND, +BS. No rebound. No HSM. EXTR: No c/c/e NEURO Normal gait.  PSYCH: Normally interactive. Conversant. Not depressed or anxious appearing.  Calm demeanor.  Unable to reproduce soreness in Garrett muscles.  abd exam is benign  Results for orders placed or performed in visit on 12/19/13  POCT CBC  Result Value Ref Range   WBC 5.4 4.6 - 10.2 K/uL   Lymph, poc 1.8 0.6 - 3.4   POC LYMPH PERCENT 32.5 10 - 50 %L   MID (cbc) 0.2 0 - 0.9    POC MID % 4.1 0 - 12 %M   POC Granulocyte 3.4 2 - 6.9   Granulocyte percent 63.4 37 - 80 %G   RBC 5.15 4.69 - 6.13 M/uL   Hemoglobin 16.3 14.1 - 18.1 g/dL   HCT, POC 40.949.0 81.143.5 - 53.7 %   MCV 95.2 80 - 97 fL   MCH, POC 31.7 (A) 27 - 31.2 pg   MCHC 33.2 31.8 - 35.4 g/dL   RDW, POC 91.412.8 %   Platelet Count, POC 215 142 - 424 K/uL   MPV 7.5 0 - 99.8 fL    Assessment and Plan: Midline low back pain without sciatica - Plan: POCT CBC, Basic metabolic panel, CK  Muscle ache - Plan: POCT CBC, CK  Urinary frequency - Plan: POCT CBC, Basic metabolic panel, tamsulosin (FLOMAX) 0.4 MG CAPS capsule, US Renal  Pain of upper abdomen - Plan: US Abdomen Complete  Chronic chest pain - Plan: Ambulatory referral to Cardiology  Garrett Miller is here today with complaint of urinary frequency, poor sleep, chronic CP and pain all over Garrett body.  Suspect that Garrett sx may be due to stress or anxiety.  Explained that there are not many physical explanations for atraumatic pain over one's entire body.  However he does not think that anxiety or stress are problems for him.  Will try flomax to see if this will help with Garrett nocturia, and perform post- void residual scan.  Also will do abd US as he is concerned that Garrett back pain is "from my kidneys."    Also he would like to see a cardiologist about Garrett chronic CP which is reasonable- will do referral for him   Signed Abbe AmsterdamJessica Kerah Hardebeck, MD

## 2013-12-19 NOTE — Patient Instructions (Addendum)
I will be in touch with the rest of your labs.  Try the flomax for your bladder- you take it once a day.  We hope this will help with your urinary issues We will schedule you for an abdominal ultrasound, a bladder ultrasound, and a consultation with a cardiologist

## 2013-12-21 ENCOUNTER — Other Ambulatory Visit: Payer: Self-pay | Admitting: Family Medicine

## 2013-12-21 ENCOUNTER — Ambulatory Visit
Admission: RE | Admit: 2013-12-21 | Discharge: 2013-12-21 | Disposition: A | Discharge status: Home / Self Care | Payer: BC Managed Care – PPO | Source: Ambulatory Visit | Attending: Family Medicine | Admitting: Family Medicine

## 2013-12-21 DIAGNOSIS — R350 Frequency of micturition: Secondary | ICD-10-CM

## 2013-12-21 DIAGNOSIS — R1010 Upper abdominal pain, unspecified: Secondary | ICD-10-CM

## 2013-12-23 ENCOUNTER — Telehealth: Payer: Self-pay | Admitting: Family Medicine

## 2013-12-23 NOTE — Telephone Encounter (Signed)
Called and LMOM in Spanish.  All labs and US ok.  Please let me know if not better- I hope that the flomax is helping

## 2013-12-24 NOTE — Telephone Encounter (Signed)
Pt's wife called back. Had a hard time understanding VM. Relayed Dr. Cyndie Chimeopland's msg to her and she will have him call back if he is not better.

## 2013-12-25 ENCOUNTER — Telehealth: Payer: Self-pay

## 2013-12-25 NOTE — Telephone Encounter (Signed)
Pt states that he is not any better,still in a lot of pain,please advise   Best phone [phone]   Pharmacy [facility] [address]

## 2013-12-25 NOTE — Telephone Encounter (Signed)
Called and spoke with GF on the phone in detail.  Garrett Miller continues to have complaint of pain over his entire body.  She also relays that he had similar sx about a year ago- this finally resulted in him having his gallbladder out.  He felt better for a time but is frustrated that his sx have now returned.  Also he now has complaint of heartburn after eating.  Otherwise his sx are not acutely changed Explained that his labs and US studies are normal.  I have not so far found an apparent cause for his sx.  Recommended that we have him see cardiology as planned and try an OTC PPI or H2 blocker.  If sx are not better in a couple of weeks call or RTC, seek immediate care if acutely worse

## 2014-01-25 ENCOUNTER — Ambulatory Visit: Payer: Self-pay | Admitting: Cardiovascular Disease

## 2014-01-31 ENCOUNTER — Ambulatory Visit (INDEPENDENT_AMBULATORY_CARE_PROVIDER_SITE_OTHER): Payer: BC Managed Care – PPO | Admitting: Sports Medicine

## 2014-01-31 VITALS — BP 114/72 | HR 51 | Temp 98.7°F | Resp 16 | Ht 70.0 in | Wt 165.5 lb

## 2014-01-31 DIAGNOSIS — R059 Cough, unspecified: Secondary | ICD-10-CM

## 2014-01-31 DIAGNOSIS — K219 Gastro-esophageal reflux disease without esophagitis: Secondary | ICD-10-CM

## 2014-01-31 DIAGNOSIS — R05 Cough: Secondary | ICD-10-CM

## 2014-01-31 MED ORDER — PANTOPRAZOLE SODIUM 40 MG PO TBEC: 40.0000 mg | DELAYED_RELEASE_TABLET | Freq: Every day | ORAL | Status: DC

## 2014-01-31 NOTE — Progress Notes (Signed)
  Garrett Miller - 28 y.o. male MRN [id number]  Date of birth: 11-Jun-1985  CC & HPI:  Pt here for evaluation of: Cough: Reports 2 weeks of generalized weakness and he has been recovering from generalized body aches and respiratory symptoms that have essentially resolved as a week ago. He was also taking ranitidine for worsening heartburn symptoms and stopped this approximately 5 days ago. Since 3 days ago he has had worsening cough and painful swallowing. He feels as though this is different than in the past with his reflux. He denies any fever, chills, wheezing, difficulty breathing, productive sputum, generalized malaise or fatigue.   ROS:  Per HPI.   HISTORY: Past Medical, Surgical, Social, and Family History Reviewed & Updated per EMR.  Pertinent Historical Findings include: Prior treatment for gastroesophageal reflux disease otherwise healthy Nonsmoker   OBJECTIVE:  VS:   HT:5\' 10"  (177.8 cm)   WT:165 lb 8 oz (75.07 kg)  BMI:23.8          BP:114/72 mmHg  HR:(!) 51bpm  TEMP:98.7 F (37.1 C)(Oral)  RESP:99 %  PHYSICAL EXAM: GENERAL:  adult male. In no discomfort; no respiratory distress   PSYCH: alert and appropriate, good insight , speaks good [place] but slight language barrier   HNEENT: mmm, no JVD, no cervical lymphadenopathy Normal-appearing tympanic membranes, posterior oropharynx normal-appearing without streaking erythema, exudate. No tonsillar hypertrophy.   CARDIAC: RRR, S1/S2 heard, no murmur  LUNGS: CTA B, no wheezes, no crackles  ABDOMEN:  positive bowel sounds, soft, nontender   EXTREM: Warm, well perfused.  Moves all 4 extremities spontaneously; no lateralization.      ASSESSMENT: 1. Cough   2. Gastroesophageal reflux disease, esophagitis presence not specified    Overall exam reassuring for noninfectious etiology with no lymph nodes, normal-appearing tympanic membranes and normal-appearing posterior oropharynx. History of gastroesophageal reflux and was self  treating with ranitidine. Stopped ranitidine 2 days prior to worsening cough and throat symptoms.  PLAN: See problem based charting & AVS for additional documentation.  Rx for PPI 2 months  If no improvement will need reevaluation and potential ENT workup  Reviewed red flags for return including fevers, chills or worsening systemic symptoms. > Return if symptoms worsen or fail to improve.

## 2014-01-31 NOTE — Patient Instructions (Signed)
Gastroesophageal Reflux Disease, Adult Gastroesophageal reflux disease (GERD) happens when acid from your stomach flows up into the esophagus. When acid comes in contact with the esophagus, the acid causes soreness (inflammation) in the esophagus. Over time, GERD may create small holes (ulcers) in the lining of the esophagus. CAUSES   Increased body weight. This puts pressure on the stomach, making acid rise from the stomach into the esophagus.  Smoking. This increases acid production in the stomach.  Drinking alcohol. This causes decreased pressure in the lower esophageal sphincter (valve or ring of muscle between the esophagus and stomach), allowing acid from the stomach into the esophagus.  Late evening meals and a full stomach. This increases pressure and acid production in the stomach.  A malformed lower esophageal sphincter. Sometimes, no cause is found. SYMPTOMS   Burning pain in the lower part of the mid-chest behind the breastbone and in the mid-stomach area. This may occur twice a week or more often.  Trouble swallowing.  Sore throat.  Dry cough.  Asthma-like symptoms including chest tightness, shortness of breath, or wheezing. DIAGNOSIS  Your caregiver may be able to diagnose GERD based on your symptoms. In some cases, X-rays and other tests may be done to check for complications or to check the condition of your stomach and esophagus. TREATMENT  Your caregiver may recommend over-the-counter or prescription medicines to help decrease acid production. Ask your caregiver before starting or adding any new medicines.  HOME CARE INSTRUCTIONS   Change the factors that you can control. Ask your caregiver for guidance concerning weight loss, quitting smoking, and alcohol consumption.  Avoid foods and drinks that make your symptoms worse, such as:  Caffeine or alcoholic drinks.  Chocolate.  Peppermint or mint flavorings.  Garlic and onions.  Spicy foods.  Citrus fruits,  such as oranges, lemons, or limes.  Tomato-based foods such as sauce, chili, salsa, and pizza.  Fried and fatty foods.  Avoid lying down for the 3 hours prior to your bedtime or prior to taking a nap.  Eat small, frequent meals instead of large meals.  Wear loose-fitting clothing. Do not wear anything tight around your waist that causes pressure on your stomach.  Raise the head of your bed 6 to 8 inches with wood blocks to help you sleep. Extra pillows will not help.  Only take over-the-counter or prescription medicines for pain, discomfort, or fever as directed by your caregiver.  Do not take aspirin, ibuprofen, or other nonsteroidal anti-inflammatory drugs (NSAIDs). SEEK IMMEDIATE MEDICAL CARE IF:   You have pain in your arms, neck, jaw, teeth, or back.  Your pain increases or changes in intensity or duration.  You develop nausea, vomiting, or sweating (diaphoresis).  You develop shortness of breath, or you faint.  Your vomit is green, yellow, black, or looks like coffee grounds or blood.  Your stool is red, bloody, or black. These symptoms could be signs of other problems, such as heart disease, gastric bleeding, or esophageal bleeding. MAKE SURE YOU:   Understand these instructions.  Will watch your condition.  Will get help right away if you are not doing well or get worse. Document Released: 11/11/2004 Document Revised: 04/26/2011 Document Reviewed: 08/21/2010 ExitCare Patient Information 2015 [employer]. This information is not intended to replace advice given to you by your health care provider. Make sure you discuss any questions you have with your health care provider.  

## 2014-02-22 ENCOUNTER — Encounter: Payer: Self-pay | Admitting: Cardiovascular Disease

## 2014-02-22 ENCOUNTER — Ambulatory Visit (INDEPENDENT_AMBULATORY_CARE_PROVIDER_SITE_OTHER): Payer: BLUE CROSS/BLUE SHIELD | Admitting: Cardiovascular Disease

## 2014-02-22 VITALS — BP 110/72 | HR 53 | Ht 70.0 in | Wt 167.1 lb

## 2014-02-22 DIAGNOSIS — R0789 Other chest pain: Secondary | ICD-10-CM

## 2014-02-22 DIAGNOSIS — R079 Chest pain, unspecified: Secondary | ICD-10-CM | POA: Insufficient documentation

## 2014-02-22 DIAGNOSIS — I10 Essential (primary) hypertension: Secondary | ICD-10-CM

## 2014-02-22 NOTE — Patient Instructions (Signed)
Your physician has recommended you make the following change in your medication:  STOP Bisoprolol/HCTZ - reduce dose over several days and then stop TAKE Motrin/Ibuprofen 400-600 mg three times daily or Aleve 1 tab as needed for chest wall pain  Your physician recommends that you schedule a follow-up appointment in: 2 months with Dr. Elease HashimotoNahser

## 2014-02-22 NOTE — Progress Notes (Signed)
     Garrett Miller Date of Birth  17-Jul-1985       [facility]    [place] [facility] [address], [address]  [address] [place]  [address]   [place]  [id number] [phone]     [phone]   Fax  [phone]     Fax [phone]  Problem List: 1. Chest pain  2. GERD 3.  HTN:   History of Present Illness:  Garrett Miller is a 29 yo who is referred for evaluation of chest pain.  Intrepretation by Hale DroneFabiola Miller ( wife)  He is a very healthy man. 4 months ago he was having chest pain,  Received some meds ( muscle relaxors and naprosyn) helped a little.   Pain has never completely gone away. Pain is left sided, radiates from front to back. Also has some left hand numbness Wore with stretching back  Not worsened by walking or climbing stairs.  No regular exercise outside of work. Diet is pretty healthy, not much grease.  Nonsmoker Etoh - none Fhx:  No cardiac disease.   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   Current Outpatient Prescriptions on File Prior to Visit  Medication Sig Dispense Refill  . bisoprolol-hydrochlorothiazide (ZIAC) 5-6.25 MG per tablet Take 1 tablet by mouth daily. 90 tablet 3  . pantoprazole (PROTONIX) 40 MG tablet Take 1 tablet (40 mg total) by mouth daily. 30 tablet 2   No current facility-administered medications on file prior to visit.    No Known Allergies  Past Medical History  Diagnosis Date  . Hypertension     Past Surgical History  Procedure Laterality Date  . Cholecystectomy      History  Smoking status  . Never Smoker   Smokeless tobacco  . Never Used    History  Alcohol Use No    Family History  Problem Relation Age of Onset  . Hypertension Mother     Reviw of Systems:  Reviewed in the HPI.  All other systems are negative.  Physical Exam: Blood pressure 110/72, pulse 53, height 5\' 10"  (1.778 m), weight 167 lb 1.9 oz (75.805 kg). Wt Readings from Last 3 Encounters:    02/22/14 167 lb 1.9 oz (75.805 kg)  01/31/14 165 lb 8 oz (75.07 kg)  12/19/13 161 lb (73.029 kg)     General: Well developed, well nourished, in no acute distress.  Head: Normocephalic, atraumatic, sclera non-icteric, mucus membranes are moist,   Neck: Supple. Carotids are 2 + without bruits. No JVD   Lungs: Clear   Heart: RR, normal S1S2  Abdomen: Soft, non-tender, non-distended with normal bowel sounds.  Msk:  Strength and tone are normal   Extremities: No clubbing or cyanosis. No edema.  Distal pedal pulses are 2+ and equal    Neuro: CN II - XII intact.  Alert and oriented X 3.   Psych:  Normal   ECG: Jan. 8, 2016:  Sinus brady at 53. No ST or T wave changes.   Assessment / Plan:

## 2014-02-22 NOTE — Assessment & Plan Note (Signed)
Garrett Miller presents with atypical CP.  Seems more musculskelatal - worsens with deep breath and with chest movement.  Not with walking He works on an Theatre stage managerassembly line I suspect that this is due to strain from reaching twisting of the lung.  Encouraged him to take Motrin or Aleve on a regular basis.  He also thinks that his bisoprolol/HCTZ is contributing to the chest discomfort. His blood pressure is normal and his heart rate is slow. We will discontinue this medication and we'll consider alternative blood pressure medications if needed.  I'll see him again in 2 months for followup visit reassessment. We discussed possibly doing a treadmill.

## 2014-02-23 NOTE — Assessment & Plan Note (Signed)
His BP is on the low side. HR is low Will taper the Ziac  To off over the next several days He thinks that the Ziac is causing some of his problems.

## 2014-04-26 ENCOUNTER — Ambulatory Visit: Payer: BLUE CROSS/BLUE SHIELD | Admitting: Cardiovascular Disease

## 2014-06-03 ENCOUNTER — Ambulatory Visit (INDEPENDENT_AMBULATORY_CARE_PROVIDER_SITE_OTHER): Payer: BLUE CROSS/BLUE SHIELD | Admitting: Family Medicine

## 2014-06-03 VITALS — BP 102/74 | HR 50 | Temp 98.0°F | Resp 16 | Ht 70.5 in | Wt 165.1 lb

## 2014-06-03 DIAGNOSIS — K219 Gastro-esophageal reflux disease without esophagitis: Secondary | ICD-10-CM

## 2014-06-03 DIAGNOSIS — R079 Chest pain, unspecified: Secondary | ICD-10-CM

## 2014-06-03 DIAGNOSIS — R300 Dysuria: Secondary | ICD-10-CM | POA: Diagnosis not present

## 2014-06-03 DIAGNOSIS — R1011 Right upper quadrant pain: Secondary | ICD-10-CM | POA: Diagnosis not present

## 2014-06-03 LAB — POCT UA - MICROSCOPIC ONLY
BACTERIA, U MICROSCOPIC: NEGATIVE
CASTS, UR, LPF, POC: NEGATIVE
CASTS, UR, LPF, POC: NEGATIVE
CRYSTALS, UR, HPF, POC: NEGATIVE
Crystals, Ur, HPF, POC: NEGATIVE
EPITHELIAL CELLS, URINE PER MICROSCOPY: NEGATIVE
MUCUS UA: NEGATIVE
Mucus, UA: NEGATIVE
RBC, URINE, MICROSCOPIC: NEGATIVE
WBC, Ur, HPF, POC: NEGATIVE
Yeast, UA: NEGATIVE
Yeast, UA: NEGATIVE

## 2014-06-03 LAB — POCT URINALYSIS DIPSTICK
Bilirubin, UA: NEGATIVE
Blood, UA: NEGATIVE
Glucose, UA: NEGATIVE
KETONES UA: NEGATIVE
Leukocytes, UA: NEGATIVE
Nitrite, UA: NEGATIVE
Protein, UA: NEGATIVE
SPEC GRAV UA: 1.01
UROBILINOGEN UA: 0.2
pH, UA: 6.5

## 2014-06-03 LAB — POCT CBC
Granulocyte percent: 51 %G (ref 37–80)
HEMATOCRIT: 45.8 % (ref 43.5–53.7)
HEMOGLOBIN: 15.6 g/dL (ref 14.1–18.1)
LYMPH, POC: 2.8 (ref 0.6–3.4)
MCH: 30.7 pg (ref 27–31.2)
MCHC: 34 g/dL (ref 31.8–35.4)
MCV: 90.3 fL (ref 80–97)
MID (cbc): 0.3 (ref 0–0.9)
MPV: 8.1 fL (ref 0–99.8)
POC Granulocyte: 3.2 (ref 2–6.9)
POC LYMPH PERCENT: 44.5 %L (ref 10–50)
POC MID %: 4.5 %M (ref 0–12)
Platelet Count, POC: 226 10*3/uL (ref 142–424)
RBC: 5.07 M/uL (ref 4.69–6.13)
RDW, POC: 12.5 %
WBC: 6.3 10*3/uL (ref 4.6–10.2)

## 2014-06-03 MED ORDER — SUCRALFATE 1 G PO TABS: 1.0000 g | ORAL_TABLET | Freq: Three times a day (TID) | ORAL | Status: DC

## 2014-06-03 MED ORDER — MELOXICAM 15 MG PO TABS: 15.0000 mg | ORAL_TABLET | Freq: Every day | ORAL | Status: DC

## 2014-06-03 MED ORDER — OMEPRAZOLE 40 MG PO CPDR: 40.0000 mg | DELAYED_RELEASE_CAPSULE | Freq: Every day | ORAL | Status: DC

## 2014-06-03 NOTE — Progress Notes (Signed)
Subjective:   This chart was scribed for Dr. Norberto Sorenson, MD by Jarvis Morgan, ED Scribe. This patient was seen in Room 10 and the patient's care was started at 6:24 PM.   Patient ID: Garrett Miller, male    DOB: Jul 30, 1985, 29 y.o.   MRN: [id number]  Chief Complaint  Patient presents with  . Gastrophageal Reflux    HPI HPI Comments: Garrett Miller is a 29 y.o. male with a h/o HTN who [facility] complaining of constant, gastroesophageal reflux for 2 weeks. He is complaining of associated RUQ abdominal pain, sore throat, dry mouth, decreased appetite, intermittent nausea, and mild, intermittent, dysuria. Pt states that pain is not associated with a time of day. Pt notes when waking in the morning he has a sore throat, dry mouth and metallic taste in mouth.  He reports that eating exacerbates the symptoms and nothing seems to make it better. Pt has used Tums with no relief. He saw his cardiologist 1 month ago due to having chest pains and at that visit he was told to taper off BP medications. Per note from that visit his cardiologist believed his chest pain to be muscular in nature. He was instructed to use Motrin and Tylenol for the pain. He states he occasionally takes these medications for the pain. He notes no change in chest pain. Pt notes that when he went off his BP medication he started to get HAs so he went back on the medication. Pt had complete abdominal US 5 months ago which showed prior cholecstectomy and was otherwise normal. He denies any other abdominal surgeries. Pt has not followed up with a GI specialist for this issue. He denies bad breath, dental issues, abdominal distention, diarrhea, constipation, dizziness, weakness, fatigue, vision changes, or emesis.   Past Medical History  Diagnosis Date  . Hypertension    No current outpatient prescriptions on file prior to visit.   No current facility-administered medications on file prior to visit.     No Known Allergies   Review of Systems  Constitutional: Positive for appetite change (decreased). Negative for fatigue.  HENT: Positive for sore throat. Negative for dental problem.   Eyes: Negative for visual disturbance.  Cardiovascular: Positive for chest pain.  Gastrointestinal: Positive for nausea and abdominal pain (RUQ). Negative for vomiting, diarrhea, constipation and abdominal distention.  Genitourinary: Positive for dysuria.  Neurological: Negative for dizziness and weakness.      Triage Vitals: BP 102/74 mmHg  Pulse 50  Temp(Src) 98 F (36.7 C) (Oral)  Resp 16  Ht 5' 10.5" (1.791 m)  Wt 165 lb 2 oz (74.9 kg)  BMI 23.35 kg/m2  SpO2 100%  Objective:   Physical Exam  Constitutional: He is oriented to person, place, and time. He appears well-developed and well-nourished. No distress.  HENT:  Head: Normocephalic and atraumatic.  Right Ear: Tympanic membrane is erythematous and retracted.  Left Ear: Tympanic membrane is scarred.  Nose: Nose normal.  Mouth/Throat: Posterior oropharyngeal erythema (mild) present. No oropharyngeal exudate.  Eyes: Conjunctivae and EOM are normal.  Neck: Neck supple. No tracheal deviation present. No thyroid mass and no thyromegaly present.  Cardiovascular: Normal rate, regular rhythm, S1 normal, S2 normal and normal heart sounds.   No murmur heard. Pulmonary/Chest: Effort normal and breath sounds normal. No respiratory distress.  Abdominal: Bowel sounds are normal. He exhibits no mass. There is no hepatosplenomegaly. There is no tenderness. There is no CVA tenderness and negative Murphy's  sign.  Musculoskeletal: Normal range of motion.  Lymphadenopathy:    He has no cervical adenopathy.       Right cervical: No posterior cervical adenopathy present.      Left cervical: No posterior cervical adenopathy present.  Neurological: He is alert and oriented to person, place, and time.  Skin: Skin is warm and dry.  Psychiatric: He has a  normal mood and affect. His behavior is normal.  Nursing note and vitals reviewed.  Results for orders placed or performed in visit on 06/03/14  POCT CBC  Result Value Ref Range   WBC 6.3 4.6 - 10.2 K/uL   Lymph, poc 2.8 0.6 - 3.4   POC LYMPH PERCENT 44.5 10 - 50 %L   MID (cbc) 0.3 0 - 0.9   POC MID % 4.5 0 - 12 %M   POC Granulocyte 3.2 2 - 6.9   Granulocyte percent 51.0 37 - 80 %G   RBC 5.07 4.69 - 6.13 M/uL   Hemoglobin 15.6 14.1 - 18.1 g/dL   HCT, POC 61.9 50.9 - 53.7 %   MCV 90.3 80 - 97 fL   MCH, POC 30.7 27 - 31.2 pg   MCHC 34.0 31.8 - 35.4 g/dL   RDW, POC 32.6 %   Platelet Count, POC 226 142 - 424 K/uL   MPV 8.1 0 - 99.8 fL  POCT UA - Microscopic Only  Result Value Ref Range   WBC, Ur, HPF, POC neg    RBC, urine, microscopic neg    Bacteria, U Microscopic neg    Mucus, UA neg    Epithelial cells, urine per micros neg    Crystals, Ur, HPF, POC neg    Casts, Ur, LPF, POC neg    Yeast, UA neg   POCT urinalysis dipstick  Result Value Ref Range   Color, UA yellow    Clarity, UA clear    Glucose, UA neg    Bilirubin, UA neg    Ketones, UA neg    Spec Grav, UA 1.010    Blood, UA neg    pH, UA 6.5    Protein, UA neg    Urobilinogen, UA 0.2    Nitrite, UA neg    Leukocytes, UA Negative        Assessment & Plan:   1. Gastroesophageal reflux disease, esophagitis presence not specified - stop nsaids, restart ppi w/ prn carafate. If H. Pylori is negative will refer pt to GI for upper endoscopy.  2. Dysuria  - mild, urine normal.   3. Chest pain, unspecified chest pain type - improved w/ nsaids that cardiology recommended so change cox-2.inh.  4. Right upper quadrant pain     Orders Placed This Encounter  Procedures  . Comprehensive metabolic panel  . Lipase  . H. pylori breath test  . POCT CBC  . POCT UA - Microscopic Only  . POCT urinalysis dipstick    Meds ordered this encounter  Medications  . meloxicam (MOBIC) 15 MG tablet    Sig: Take 1 tablet (15  mg total) by mouth daily. As needed for musculoskeletal chest wall pain    Dispense:  30 tablet    Refill:  0  . sucralfate (CARAFATE) 1 G tablet    Sig: Take 1 tablet (1 g total) by mouth 4 (four) times daily -  with meals and at bedtime. As needed for acid reflux    Dispense:  60 tablet    Refill:  0  .  omeprazole (PRILOSEC) 40 MG capsule    Sig: Take 1 capsule (40 mg total) by mouth daily.    Dispense:  30 capsule    Refill:  3    I personally performed the services described in this documentation, which was scribed in my presence. The recorded information has been reviewed and considered, and addended by me as needed.  Norberto SorensonEva Shaw, MD MPH

## 2014-06-03 NOTE — Patient Instructions (Addendum)
If you have the bacteria that causes stomach ulcers we will treat that.  If you do not, then we will refer you to gastroenterology as they will want to do an upper endoscopy to ensure no ulcers or changes in the cells from the acid as well as to check to make sure you don't have a hiatal hernia.  In the meantime, please try the meloxicam once daily as needed for the chest wall pain.  Do not use it with any other otc pain medication other than tylenol/acetaminophen - so no aleve, ibuprofen, motrin, advil, etc.  Start omeprazole daily 30 minutes before breakfast - if you use over the counter make sure you take 2 tabs instead of 1.  It can take several days to kick in so you can use the carafate with every meal until then.  Your blood pressure is quite low so I agree that you could probably wean off of the blood pressure medicine - maybe at least have the diuretic hctz part of it taken out and just stay on the bisoprolol which can help with headaches.  Consider making a follow up appt to recheck on your stomach pain and discuss this.   Opciones de alimentos para pacientes con reflujo gastroesofgico (Food Choices for Gastroesophageal Reflux Disease) Cuando se tiene reflujo gastroesofgico (ERGE), los alimentos que se ingieren y los hbitos de alimentacin son muy importantes. Elegir los alimentos adecuados puede ayudar a Paramedicaliviar las molestias ocasionadas por el OdessaERGE. QU PAUTAS GENERALES DEBO SEGUIR?  Elija las frutas, los vegetales, los cereales integrales, los productos lcteos, la carne de [place], de pescado y de ave con bajo contenido de grasas.  Limite las grasas, [address]o los Matoakaaceites, los aderezos para [place]ootenaiensalada, la Lakelandmanteca, los frutos secos y Programme researcher, broadcasting/film/videoel aguacate.  Lleve un registro de las comidas para identificar los alimentos que ocasionan sntomas.  Evite los alimentos que le ocasionen reflujo. Pueden ser distintos para cada persona.  Haga comidas pequeas con frecuencia en lugar de tres comidas Clear Channel Communicationsabundantes  todos los das.  Coma lentamente, en un clima distendido.  Limite el consumo de alimentos fritos.  Cocine los alimentos utilizando mtodos que no sean la fritura.  Evite el consumo alcohol.  Evite beber grandes cantidades de lquidos con las comidas.  Evite agacharse o recostarse hasta despus de 2 o 3horas de haber comido. QU ALIMENTOS NO SE RECOMIENDAN? Los siguientes son algunos alimentos y bebidas que pueden empeorar los sntomas: Veterinary surgeonVegetales Tomates. Jugo de tomate. Salsa de tomate y espagueti. Ajes. Cebolla y Carol Streamajo. Rbano picante. Frutas Naranjas, pomelos y limn (fruta y Sloveniajugo). Carnes Carnes de [place], de pescado y de ave con gran contenido de grasas. Esto incluye los perros calientes, las Morrowcostillas, el Chubbuckjamn, la salchicha, el salame y el tocino. Lcteos Leche entera y [place] chocolatada. PPG IndustriesCrema cida. Crema. Mantequilla. Helados. Queso crema.  Bebidas Caf y t negro, con o sin cafena Bebidas gaseosas o energizantes. Condimentos Salsa picante. Salsa barbacoa.  Dulces/postres Chocolate y cacao. Rosquillas. Menta y mentol. Grasas y Massachusetts Mutual Lifeaceites Alimentos con alto contenido de grasas, incluidas las papas fritas. Otros Vinagre. Especias picantes, como la Brink's Companypimienta negra, la pimienta blanca, la pimienta roja, la pimienta de cayena, el curry en Churchillpolvo, los clavos de JAARSolor, el jengibre y el [place] en polvo. Los artculos mencionados arriba pueden no ser Raytheonuna lista completa de las bebidas y los alimentos que se Theatre stage managerdeben evitar. Comunquese con el nutricionista para recibir ms informacin. Document Released: 11/11/2004 Document Revised: 02/06/2013 [facility] Patient Information 2015 [place] BeachExitCare, [place]. This  information is not intended to replace advice given to you by your health care provider. Make sure you discuss any questions you have with your health care provider.  Reflujo gastroesofgico - Adultos  (Gastroesophageal Reflux Disease, Adult)  El reflujo gastroesofgico ocurre cuando el cido  del estmago pasa al esfago. Cuando el cido entra en contacto con el esfago, el cido provoca dolor (inflamacin) en el esfago. Con el tiempo, pueden formarse pequeos agujeros (lceras) en el revestimiento del esfago. CAUSAS   Exceso de Runner, broadcasting/film/video. Esto aplica presin Eli Lilly and Company, lo que hace que el cido del estmago suba hacia el esfago.  El hbito de fumar Aumenta la produccin de cido en el Ord.  El consumo de alcohol. Provoca disminucin de la presin en el esfnter esofgico inferior (vlvula o anillo de msculo entre el esfago y Investment banker, corporate), permitiendo que el cido del estmago suba hacia el esfago.  Cenas a ltima hora del da y estmago lleno. Aumenta la presin y la produccin de cido en el estmago.  Malformacin en el esfnter esofgico inferior. A menudo no se halla causa.  SNTOMAS   Ardor y Radiographer, therapeutic parte inferior del pecho detrs del esternn y en la zona media del [place]. Puede ocurrir Toys 'R[employer] Us por semana o ms a menudo.  Dificultad para tragar.  Dolor de Advertising copywriter.  Tos seca.  Sntomas similares al asma que incluyen sensacin de opresin en el pecho, falta de aire y sibilancias. DIAGNSTICO  El mdico diagnosticar el problema basndose en los sntomas. En algunos casos, se indican radiografas y otras pruebas para verificar si hay complicaciones o para comprobar el estado del [address] y Training and development officer.  TRATAMIENTO  El mdico le indicar medicamentos de venta libre o recetados para ayudar a disminuir la produccin de cido. Consulte con su mdico antes de Corporate investment banker o agregar cualquier medicamento nuevo.  INSTRUCCIONES PARA EL CUIDADO EN EL HOGAR   Modifique los factores que pueda cambiar. Consulte con su mdico para solicitar orientacin relacionada con la prdida de peso, dejar de fumar y el consumo de alcohol.  Evite las comidas y bebidas que [address] [place], [place]:  [place] con cafena o alcohlicas.  Chocolate.  Sabores a  Advertising account planner.  Ajo y cebolla.  Comidas muy condimentadas.  Ctricos como naranjas, limones o limas.  Alimentos que contengan tomate, como salsas, [place] y pizza.  Alimentos fritos y Lexicographer.  Evite acostarse durante 3 horas antes de irse a dormir o antes de tomar una siesta.  Haga comidas pequeas durante Glass blower/designer de 3 comidas abundantes.  Use ropas sueltas. No use nada apretado alrededor de la cintura que cause presin en el estmago.  Levante (eleve) la cabecera de la cama 6 a 8 pulgadas (15 a 20 cm) con bloques de madera. Usar almohadas extra no ayuda.  Solo tome medicamentos que se pueden comprar sin receta o recetados para el dolor, Dentist o fiebre, como le indica el mdico.  No tome aspirina, ibuprofeno ni antiinflamatorios no esteroides. SOLICITE ATENCIN MDICA DE Engelhard Corporation SI:   Goldman Sachs, el cuello, la Bieber, los dientes o la espalda.  El dolor aumenta o cambia la intensidad o la durancin.  Tiene nuseas, vmitos o sudoracin(diaforesis).  Siente falta de aire o dolor en el pecho, o se desmaya.  Vomita y el vmito tiene [place], es de color [place], [place], negro o es similar a la borra del caf o tiene Halls.  Las heces son rojas, sanguinolentas o negras.  Estos sntomas pueden ser signos de [address], como enfermedades cardacas, hemorragias gstrias o sangrado esofgico.  ASEGRESE DE QUE:   Comprende estas instrucciones.  Controlar su enfermedad.  Solicitar ayuda de inmediato si no mejora o si empeora. Document Released: 11/11/2004 Document Revised: 04/26/2011 [facility] Patient Information 2015 [place], [place]. This information is not intended to replace advice given to you by your health care provider. Make sure you discuss any questions you have with your health care provider.

## 2014-06-04 LAB — COMPREHENSIVE METABOLIC PANEL
ALT: 31 U/L (ref 0–53)
AST: 25 U/L (ref 0–37)
Albumin: 4.4 g/dL (ref 3.5–5.2)
Alkaline Phosphatase: 66 U/L (ref 39–117)
BILIRUBIN TOTAL: 0.7 mg/dL (ref 0.2–1.2)
BUN: 17 mg/dL (ref 6–23)
CHLORIDE: 102 meq/L (ref 96–112)
CO2: 28 meq/L (ref 19–32)
CREATININE: 0.88 mg/dL (ref 0.50–1.35)
Calcium: 9.2 mg/dL (ref 8.4–10.5)
Glucose, Bld: 75 mg/dL (ref 70–99)
Potassium: 4.3 mEq/L (ref 3.5–5.3)
SODIUM: 137 meq/L (ref 135–145)
Total Protein: 7.5 g/dL (ref 6.0–8.3)

## 2014-06-04 LAB — LIPASE: Lipase: 23 U/L (ref 0–75)

## 2014-06-05 ENCOUNTER — Encounter: Payer: Self-pay | Admitting: Family Medicine

## 2014-06-05 LAB — H. PYLORI BREATH TEST: H. PYLORI BREATH TEST: NOT DETECTED

## 2014-06-07 ENCOUNTER — Ambulatory Visit: Payer: BLUE CROSS/BLUE SHIELD | Admitting: Cardiovascular Disease

## 2014-07-13 NOTE — Addendum Note (Signed)
Addended by: Norberto SorensonSHAW, Alizzon Dioguardi on: 07/13/2014 03:02 AM   Modules accepted: Orders

## 2014-07-22 ENCOUNTER — Ambulatory Visit (INDEPENDENT_AMBULATORY_CARE_PROVIDER_SITE_OTHER): Payer: BLUE CROSS/BLUE SHIELD | Admitting: Family Medicine

## 2014-07-22 VITALS — BP 138/78 | HR 75 | Temp 98.3°F | Resp 16 | Ht 71.5 in | Wt 168.5 lb

## 2014-07-22 DIAGNOSIS — K219 Gastro-esophageal reflux disease without esophagitis: Secondary | ICD-10-CM

## 2014-07-22 DIAGNOSIS — M545 Low back pain, unspecified: Secondary | ICD-10-CM

## 2014-07-22 DIAGNOSIS — I10 Essential (primary) hypertension: Secondary | ICD-10-CM | POA: Diagnosis not present

## 2014-07-22 LAB — POCT URINALYSIS DIPSTICK
Bilirubin, UA: NEGATIVE
GLUCOSE UA: NEGATIVE
KETONES UA: NEGATIVE
Leukocytes, UA: NEGATIVE
NITRITE UA: NEGATIVE
PROTEIN UA: NEGATIVE
RBC UA: NEGATIVE
Spec Grav, UA: 1.015
Urobilinogen, UA: 0.2
pH, UA: 8

## 2014-07-22 MED ORDER — AMLODIPINE BESYLATE 5 MG PO TABS: 5.0000 mg | ORAL_TABLET | Freq: Every day | ORAL | Status: DC

## 2014-07-22 MED ORDER — ESOMEPRAZOLE MAGNESIUM 40 MG PO CPDR: 40.0000 mg | DELAYED_RELEASE_CAPSULE | Freq: Every day | ORAL | Status: DC

## 2014-07-22 MED ORDER — RANITIDINE HCL 150 MG PO TABS: 150.0000 mg | ORAL_TABLET | Freq: Two times a day (BID) | ORAL | Status: DC | PRN

## 2014-07-22 NOTE — Progress Notes (Signed)
Subjective:    Patient ID: Garrett Miller, male    DOB: 10/26/1985, 29 y.o.   MRN: [id number] This chart was scribed for Sherren Mocha, MD by [place] Peace, ED Scribe. The patient was seen in [facility]. The patient's care was started at 8:15 PM.  Chief Complaint  Patient presents with  . Medication Problem    bp      HPI  Pt has been seen by Dr. Elease Hashimoto, cardiologist, for his chest pain. History of hypertension. Pt has been on Bisoprolol-HTZ which he was concerned was exacerbating chest pain. Cardiologist advised him to stop taking BP medication and if symptoms resolved, he could be placed on a different medication. However, chest pain did not resolve and headache returned, he was then told to start back taking BP medication. He was seen by Dr. Clelia Croft 6 weeks ago regarding GERD-like symptoms and had PPI with PRN Carafate performed which came back negative. He also had abdominal ultrasound performed which was normal and cholecystectomy. Testing for Lipase and H. Pylori was also normal. Pt was then referred to Gastroenterology for further evaluation.   HPI Comments: Garrett Miller is a 29 y.o. male who presents to the [facility] seeking consultation regarding BP medication. Pt reports the medication he has been on has been causing an increase in urination frequency as well as dysuria which he was advised of could possibly happen prior to starting it. He stopped medication 3 days ago after symptoms persisted and notes they soon resolved. He also reports slight improvement in headaches since stopping medication but explains it has only been 3 days so he has not been able to tell that big of a difference. He denies any current abdominal pain but reports acid reflux symptoms are still present. Pt further denies any chest pain or using any OTC pain medications.     Past Medical History  Diagnosis Date  . Hypertension    Past Surgical History  Procedure Laterality Date  . Cholecystectomy     No Known Allergies No  current outpatient prescriptions on file prior to visit.   No current facility-administered medications on file prior to visit.     Review of Systems  Constitutional: Negative for fever, activity change, appetite change and unexpected weight change.  HENT: Positive for sore throat. Negative for trouble swallowing and voice change.   Respiratory: Negative for chest tightness and shortness of breath.   Cardiovascular: Negative for chest pain, palpitations and leg swelling.  Gastrointestinal: Negative for nausea, vomiting, abdominal pain and abdominal distention.  Genitourinary: Negative for dysuria and frequency.  Neurological: Positive for headaches. Negative for dizziness and light-headedness.       Objective:   Physical Exam  Constitutional: He is oriented to person, place, and time. He appears well-developed and well-nourished. No distress.  HENT:  Head: Normocephalic and atraumatic.  Eyes: Conjunctivae and EOM are normal.  Neck: Neck supple. No tracheal deviation present.  Cardiovascular: Normal rate, regular rhythm, S1 normal, S2 normal and normal heart sounds.   No murmur heard. Pulmonary/Chest: Effort normal. No respiratory distress.  Musculoskeletal: Normal range of motion.  Neurological: He is alert and oriented to person, place, and time.  Skin: Skin is warm and dry.  Psychiatric: He has a normal mood and affect. His behavior is normal.  Nursing note and vitals reviewed.    Filed Vitals:   07/22/14 1918  BP: 138/78  Pulse: 75  Temp: 98.3 F (36.8 C)  Resp: 16    8:21 PM- Treatment  plan was discussed with patient who verbalizes understanding and agrees.   Results for orders placed or performed in visit on 07/22/14  POCT UA - Microscopic Only  Result Value Ref Range   WBC, Ur, HPF, POC 0-1    RBC, urine, microscopic 0-1    Bacteria, U Microscopic trace    Mucus, UA neg    Epithelial cells, urine per micros 0-2    Crystals, Ur, HPF, POC neg    Casts, Ur,  LPF, POC neg    Yeast, UA neg   POCT urinalysis dipstick  Result Value Ref Range   Color, UA yellow    Clarity, UA clear    Glucose, UA neg    Bilirubin, UA neg    Ketones, UA neg    Spec Grav, UA 1.015    Blood, UA neg    pH, UA 8.0    Protein, UA neg    Urobilinogen, UA 0.2    Nitrite, UA neg    Leukocytes, UA Negative        Assessment & Plan:   1. Essential hypertension, benign  - currently off bisoprolol-hctz x4d and BP only mildly elevated but pt's HAs hasreturned now which happened prev as well off med. - hctz was causing urinary frequency and dysuria which has completely resolved now; discussed r/b of trying to stay off anti-hypertensive w/ tlc but pt prefers to be on med instead.  try amlodipine 2.5mg  qd as HR was 50s on bisoprolol and cardiology was concerned this could be causing pt's prior atypical CP.  If HA does not resolve pt to call to change back to bisoprolol. Rec purchasing home bp cuff for monitoring and call if BP >140/90.  Unlikely pt's HAs are due to BP but bisoprolol may have been preventing migraine type HA.  2. Bilateral low back pain without sciatica - suspect benign lumbago -  pt reassured not from kidneys  3. Gastroesophageal reflux disease, esophagitis presence not specified - improved but still w/ some laryngeal sxs esp o/n so try esomeprazole rather than omeprazole and switch to 30 min before dinner. Has Gi referral from prior so pt will call [facility] to sched if sxs persist.  Try prn zantac    Orders Placed This Encounter  Procedures  . POCT UA - Microscopic Only  . POCT urinalysis dipstick    Meds ordered this encounter  Medications  . DISCONTD: bisoprolol-hydrochlorothiazide (ZIAC) 5-6.25 MG per tablet    Sig: Take 1 tablet by mouth daily.  Marland Kitchen. esomeprazole (NEXIUM) 40 MG capsule    Sig: Take 1 capsule (40 mg total) by mouth daily.    Dispense:  30 capsule    Refill:  3  . ranitidine (ZANTAC) 150 MG tablet    Sig: Take 1 tablet (150 mg total)  by mouth 2 (two) times daily as needed for heartburn.    Dispense:  60 tablet    Refill:  11  . amLODipine (NORVASC) 5 MG tablet    Sig: Take 1 tablet (5 mg total) by mouth daily.    Dispense:  90 tablet    Refill:  3    I personally performed the services described in this documentation, which was scribed in my presence. The recorded information has been reviewed and considered, and addended by me as needed.  Norberto SorensonEva Shaw, MD MPH

## 2014-07-22 NOTE — Patient Instructions (Signed)
I switched you to a different blood pressure medicine entirely from the one you were on before - I am hoping that this will have even less side effects.  It will not affect your urine and will not lower your heart rate which is currently at a great level while you are OFF of the medication.  I would actually recommend just taking 1/2 tab of the new medication amlodipine daily. If you have any side effects (swelling in your legs) just call and I will switch you back to just bisoprolol that you are on now but without the hctz (which is causing the urinary frequency and concentrated urine.) I recommend purchasing a blood pressure cuff at [employer] or on-line [employer]) - you can often find an excellent product for less than a co-pay for an office visit.  See below for instructions about blood pressure goals and if you have any questions about your blood pressure or concerns/questions about the medication, don't hesitate to call or come back in.   Controle su presin arterial (Managing Your High Blood Pressure) La presin arterial es la medida de la fuerza de la sangre al presionar contra las paredes de las arterias. Las arterias son tubos musculares que estn dentro del sistema circulatorio. La presin arterial no es constante. Se eleva con la actividad, la excitacin o el nerviosismo y disminuye durante el sueo y Facilities manager. Si los valores de medicin de la presin arterial se mantienen por arriba de lo normal por [employer], hay riesgo de [address] La presin arterial alta (hipertensin) es una enfermedad de larga duracin (crnica) en la que la presin arterial est elevada.  La lectura de la presin arterial se registra con dos nmeros, por ejemplo 120 sobre 80 (o 120/80). El primer nmero, el ms alto, es la presin sistlica. Es la medida de la presin de las arterias cuando el corazn late. El segundo nmero, el ms bajo, es la presin diastlica. Es la medida de la presin en las  arterias cuando el corazn se relaja entre latidos.  Es importante Photographer presin arterial en un rango normal para Personal assistant en general y otros problemas de salud, como enfermedades del corazn e ictus. Cuando no se controla la presin arterial, el corazn trabaja ms de lo normal. La hipertensin arterial es una enfermedad muy comn en los adultos debido a que tiende a Administrator, Civil Service con la edad. Hombres y mujeres son igualmente propensos a tener hipertensin, pero en diferentes momentos de la vida. Antes de los 45 aos, los hombres son ms propensos a sufrir hipertensin. Despus de 65 aos de edad, las mujeres tienen ms probabilidades de padecerla. La hipertensin es [employer] afroamericanos. Esta enfermedad generalmente no manifiesta signos ni sntomas. Generalmente se desconoce la causa. El mdico podr indicarle un plan para mantener la presin arterial en un rango normal y saludable.  ETAPAS DE PRESIN ARTERIAL La presin arterial se clasifica en cuatro etapas: normal, prehipertensin, etapa 1 y etapa 2. Se puede leer la presin arterial para determinar qu tipo de tratamiento, si se indicara, es necesario. Las opciones apropiadas para el tratamiento estn vinculadas a estas cuatro etapas:  Normal   Presin sistlica (mm Hg): por debajo de 120.  Presin diastlica (mm Hg): por debajo de 80. Prehipertensin   Presin sistlica (mm Hg): 120 a 139.  Presin diastlica (mm Hg): 80 a 89. Etapa1   Presin sistlica (mm Hg): 140 a 159.  Presin diastlica (mm Hg): 90 a  99. Etapa2   Presin sistlica (mm Hg): 160 o ms.  Presin diastlica (mm Hg): 100 o ms. RIESGOS RELACIONADOS CON LA PRESIN ARTERIAL ALTA Controlar la presin arterial es una responsabilidad importante. La hipertensin no controlada puede llevar a:   Ataques cardacos.  Ictus.  Debilitamiento de los vasos sanguneos (aneurisma).  Insuficiencia cardaca.  Dao renal.  Dao ocular.  Sndrome  metablico.  Problemas de memoria y concentracin. CMO CONTROLAR LA PRESIN ARTERIAL La presin arterial puede ser controlada efectivamente con cambios en el estilo de vida y de medicamentos (si es necesario). El Firefighter un plan para bajar la presin arterial al rango normal. Su plan debera incluir lo siguiente:  Educacin   Lea toda la informacin proporcionada por sus mdicos acerca de cmo controlar la presin arterial.  Infrmese sobre las ltimas recomendaciones de pautas y [place]. Continuamente se hacen nuevas investigaciones para definir con ms precisin los riesgos y los tratamientos para la hipertensin arterial. Cambiosen el estilo de vida  Control del [place].  No fumar.  Mantenerse fsicamente activo.  Disminuir la cantidad de sal de la dieta.  Reducir las situaciones de estrs.  Controlar las enfermedades crnicas, como el colesterol alto o la diabetes.  Reducir el consumo de alcohol. Medicamentos  Estn disponibles diferentes medicamentos (medicamentos antihipertensivos) para que la presin arterial quede dentro de un rango normal. Comunicacin   Revise con su mdico todos los medicamentos que toma ya que puede haber efectos secundarios o interacciones.  Hable con su mdico acerca de la dieta, hbitos de ejercicio y otros factores del estilo de vida que pueden contribuir a la hipertensin arterial.  Oceanographer regularmente a la consulta con el profesional. El mdico puede ayudarle a crear y Dawayne Patricia su plan para controlar la presin arterial alta. RECOMENDACIONES PARA EL TRATAMIENTO Y CONTROL  Las siguientes recomendaciones se basan en las pautas actuales para controlar la hipertensin arterial en adultos no gestantes. Utilice estas recomendaciones para determinar el perodo de seguimiento adecuado o la opcin de tratamiento basada en la lectura de su presin arterial. Podr conversar sobre estas opciones con su mdico.   Presin sistlica de 120 a 139  o presin diastlica de 80 a 89: Concurra a las visitas de control, segn las indicaciones.  Presin sistlica de 140 a 160 o presin diastlica de 90 a 100: Haga una visita de control con el profesional dentro de [address]  Presin sistlica por arriba de 160 o presin diastlica por arriba de 100: Haga una visita de control con el profesional dentro de un mes.  Presin sistlica por arriba de 180 o presin diastlica por arriba de 110: Considere la posibilidad de seguir una terapia antihipertensiva; concurra a una visita de control con su mdico dentro de 1 semana.  Presin sistlica por arriba de 200 o presin diastlica por arriba de 120: Comience el tratamiento antihipertensivo; concurra una visita de control con su mdico dentro de 1 semana. Document Released: 10/27/2011 [facility] Patient Information 2015 [place], [place]. This information is not intended to replace advice given to you by your health care provider. Make sure you discuss any questions you have with your health care provider.  Hipertensin (Presin arterial elevada) (Hypertension, High Blood Pressure) La hipertensin es un trmino mdico para indicar una mayor presin sangunea que la normal. La hipertensin ocurre en personas de todas las edades, pero es un problema comn en personas de [place]. Muchas veces la presin arterial aumenta por encima de niveles "normales", como durante el ejercicio o  cuando se tiene miedo. Sin embargo, despus de estos perodos, la presin arterial vuelve a los Comcastniveles normales. La hipertensin persistente (crnica) hace que el corazn, los riones y los vasos sanguneos trabajen ms esforzadamente. Esto pone a las personas con hipertensin en un mayor riesgo de ataque cardaco, accidente cerebrovascular, enfermedad renal y enfermedad vascular perifrica (obstruccin de las arterias). Hay dos tipos de hipertensin: esenciales y no esenciales. Hasta el 70% de las personas estn afectadas con  hipertensin esencial, lo que significa que la razn de su enfermedad es desconocida. SNTOMAS  Este trastorno en general no causa sntomas en las primeras etapas.  Dolor de Turkmenistancabeza.  Hemorragias nasales.  Adormecimiento y hormigueo de manos y pies (parestesias).  Falta de aire.  Ritmo cardaco acelerado que se siente en el pecho (palpitaciones).  Disminucin del rendimiento deportivo. CAUSAS  Utilizacin de esteroides anablicos.  Estimulantes (cafena, cocana, Ma-huang, fenilpropanolamina, anfetaminas).  Obesidad.  Enfermedades renales.  Enfermedades de las Tyson Foodsglndulas adrenales.  Por consumo abusivo de alcohol.  Por consumo abusivo de sodio (sal).  Estrechamiento de los grandes vasos sanguneos (coartacin de la aorta).  Enfermedades vasculares. LOS RIESGOS AUMENTAN CON  Obesidad.  Estilo de vida sedentario.  El uso de esteroides anablicos.  Dieta con alto contenido de sodio.  Estimulantes (cafena, cocana, Ma-huang, anfetaminas, efedrina).  Por consumo abusivo de alcohol.  Antecedentes familiares de hipertensin.  La edad (especialmente ser mayor de 60 aos). MEDIDAS DE PREVENCIN  La hipertensin no puede prevenirse, slo controlarse.  Un buen control de la hipertensin evita complicaciones y mejora el rendimiento deportivo.  Para evitar que la hipertensin no se pueda controlar:  Evite beber alcohol en exceso.  Evite el uso de estimulantes.  Asegurarse que el ejercicio intenso no le empeora la presin arterial.  No fume.  Evitar los ejercicios isomtricos.  No contener la respiracin al Artistlevantar peso. Exhalar durante la parte ms difcil del levantamiento. PRONSTICO  Sin el tratamiento apropiado, la hipertensin puede acortar la expectativa de vida y reducir su calidad.  Si se trata adecuadamente, la hipertensin no afecta la calidad o expectativa de vida, y los deportistas pueden [address]ener el nivel de Cheswoldjuego.  La expectativa de vida  es normal o cercana a lo normal con un buen control de la presin arterial. POSIBLES COMPLICACIONES  Ataques cardacos.  Ictus.  Insuficiencia renal.  Insuficiencia cardiaca.  Escasa tolerancia a los ejercicios. CONSIDERACIONES GENERALES PARA EL TRATAMIENTO  Se podrn realizar pruebas de diagnstico para determinar la causa. Las pruebas diagnsticas incluyen un electrocardiograma (ECG), anlisis de [place] Beachsangre, radiografas de trax y un monitoreo continuo de la presin en 24 horas para Astronomerconfirmar. En algunos casos, estudios para visualizar el [name] Holdings Corporationestado de los riones.  En Restaurant manager, fast foodprimer lugar, el tratamiento implica modificacin de la dieta (de sodio y la reduccin de la ingesta de alcohol), el ejercicio y la prdida de Drakesvillepeso. Si la hipertensin no est bajo control despus de 3 a 6 meses, se administrarn medicamentos para disminuir la presin arterial.  Trate de abandonar el hbito de fumar.  Los atletas deben aprender a Pensions consultantmonitorear su presin arterial. MEDICAMENTOS  Podrn administrarse medicamentos tales como hidroclorotiazida, para disminuir la presin arterial, si los cambios de Renickestilo de vida no son suficientes.  Los medicamentos para la hipertensin pueden Film/video editorafectar el rendimiento deportivo de diferentes modos. Los deportistas deben considerar los efectos secundarios antes de elegir un medicamento contra la hipertensin. ACTIVIDAD  Existen pruebas concretas de que la actividad fsica mejora la hipertensin arterial.  El ejercicio moderado (caminata a  paso enrgico) puede ser la actividad ptima para el control de la hipertensin.  Los ejercicios de alta intensidad (levantamiento de pesas) pueden aumentar el valor de la presin arterial durante el reposo. Algunos profesionales indican a los deportistas que suspendan en entrenamiento intenso hasta controlar la hipertensin.  Un entrenamiento en circuito es seguro para los atletas que sufren este trastorno. DIETA  Los atletas deben considerar  someterse a una dieta baja en sodio, pero deben observar si sufren calambres durante la prctica de ejercicios.  Los atletas con sobrepeso deben tratar de reducirlo. SOLICITE ATENCIN MDICA SI:  No tolera bien los medicamentos.  El rendimiento atltico disminuye sbitamente o no mejora con Scientist, research (medical).  Siente dolor en el pecho, le falta el aire o tiene palpitaciones durante la prctica de ejercicios. Document Released: 11/18/2005 Document Revised: 04/26/2011 [facility] Patient Information 2015 [place], [place]. This information is not intended to replace advice given to you by your health care provider. Make sure you discuss any questions you have with your health care provider.  Hypertension Hypertension, commonly called high blood pressure, is when the force of blood pumping through your arteries is too strong. Your arteries are the blood vessels that carry blood from your heart throughout your body. A blood pressure reading consists of a higher number over a lower number, such as 110/72. The higher number (systolic) is the pressure inside your arteries when your heart pumps. The lower number (diastolic) is the pressure inside your arteries when your heart relaxes. Ideally you want your blood pressure below 120/80. Hypertension forces your heart to work harder to pump blood. Your arteries may become narrow or stiff. Having hypertension puts you at risk for heart disease, stroke, and other problems.  RISK FACTORS Some risk factors for high blood pressure are controllable. Others are not.  Risk factors you cannot control include:   Race. You may be at higher risk if you are African American.  Age. Risk increases with age.  Gender. Men are at higher risk than women before age 56 years. After age 70, women are at higher risk than men. Risk factors you can control include:  Not getting enough exercise or physical activity.  Being overweight.  Getting too much fat, sugar, calories, or  salt in your diet.  Drinking too much alcohol. SIGNS AND SYMPTOMS Hypertension does not usually cause signs or symptoms. Extremely high blood pressure (hypertensive crisis) may cause headache, anxiety, shortness of breath, and nosebleed. DIAGNOSIS  To check if you have hypertension, your health care provider will measure your blood pressure while you are seated, with your arm held at the level of your heart. It should be measured at least twice using the same arm. Certain conditions can cause a difference in blood pressure between your right and left arms. A blood pressure reading that is higher than normal on one occasion does not mean that you need treatment. If one blood pressure reading is high, ask your health care provider about having it checked again. TREATMENT  Treating high blood pressure includes making lifestyle changes and possibly taking medicine. Living a healthy lifestyle can help lower high blood pressure. You may need to change some of your habits. Lifestyle changes may include:  Following the DASH diet. This diet is high in fruits, vegetables, and whole grains. It is low in salt, red meat, and added sugars.  Getting at least 2 hours of brisk physical activity every week.  Losing weight if necessary.  Not smoking.  Limiting alcoholic beverages.  Learning ways to reduce stress. If lifestyle changes are not enough to get your blood pressure under control, your health care provider may prescribe medicine. You may need to take more than one. Work closely with your health care provider to understand the risks and benefits. HOME CARE INSTRUCTIONS  Have your blood pressure rechecked as directed by your health care provider.   Take medicines only as directed by your health care provider. Follow the directions carefully. Blood pressure medicines must be taken as prescribed. The medicine does not work as well when you skip doses. Skipping doses also puts you at risk for  problems.   Do not smoke.   Monitor your blood pressure at home as directed by your health care provider. SEEK MEDICAL CARE IF:   You think you are having a reaction to medicines taken.  You have recurrent headaches or feel dizzy.  You have swelling in your ankles.  You have trouble with your vision. SEEK IMMEDIATE MEDICAL CARE IF:  You develop a severe headache or confusion.  You have unusual weakness, numbness, or feel faint.  You have severe chest or abdominal pain.  You vomit repeatedly.  You have trouble breathing. MAKE SURE YOU:   Understand these instructions.  Will watch your condition.  Will get help right away if you are not doing well or get worse. Document Released: 02/01/2005 Document Revised: 06/18/2013 Document Reviewed: 11/24/2012 [facility] Patient Information 2015 [place], [place]. This information is not intended to replace advice given to you by your health care provider. Make sure you discuss any questions you have with your health care provider.

## 2014-07-25 ENCOUNTER — Telehealth: Payer: Self-pay

## 2014-07-25 NOTE — Telephone Encounter (Signed)
Patient's BP medication is causing headaches but when he takes it morning and night and it helps. He wants to know if that's okay or it the dosage is too small. Please call and ask to speak to [place] at [phone]

## 2014-07-26 NOTE — Telephone Encounter (Signed)
Called # and left message asking for clarification - pt was on bisoprolol prior but his HR was to low so I rx'ed him amlodipine 5mg  qd instead but told pt to take only 1/2 tab (=2.5mg ) daily at first as that may be all he needs.  So sounds like HA is coming on as med wears off?  If he wanted he could take amlodipine 1/2 tab bid as long as his BP is not to low.  Or if he just wants me to switch him back to the bisoprolol and d/c the amlodipine that is fine as well.

## 2014-07-30 NOTE — Telephone Encounter (Signed)
Called. VM not set up yet.  

## 2014-08-02 NOTE — Telephone Encounter (Signed)
Left message for pt to call back  °

## 2014-08-04 ENCOUNTER — Ambulatory Visit (INDEPENDENT_AMBULATORY_CARE_PROVIDER_SITE_OTHER): Payer: BLUE CROSS/BLUE SHIELD | Admitting: Family Medicine

## 2014-08-04 ENCOUNTER — Ambulatory Visit (INDEPENDENT_AMBULATORY_CARE_PROVIDER_SITE_OTHER): Payer: BLUE CROSS/BLUE SHIELD

## 2014-08-04 VITALS — BP 116/72 | HR 60 | Temp 98.0°F | Resp 16 | Ht 70.5 in | Wt 165.8 lb

## 2014-08-04 DIAGNOSIS — R351 Nocturia: Secondary | ICD-10-CM | POA: Diagnosis not present

## 2014-08-04 DIAGNOSIS — IMO0001 Reserved for inherently not codable concepts without codable children: Secondary | ICD-10-CM

## 2014-08-04 DIAGNOSIS — R51 Headache: Principal | ICD-10-CM

## 2014-08-04 DIAGNOSIS — R519 Headache, unspecified: Secondary | ICD-10-CM

## 2014-08-04 DIAGNOSIS — J0100 Acute maxillary sinusitis, unspecified: Secondary | ICD-10-CM | POA: Diagnosis not present

## 2014-08-04 DIAGNOSIS — G8929 Other chronic pain: Secondary | ICD-10-CM

## 2014-08-04 DIAGNOSIS — R0989 Other specified symptoms and signs involving the circulatory and respiratory systems: Secondary | ICD-10-CM | POA: Diagnosis not present

## 2014-08-04 DIAGNOSIS — I10 Essential (primary) hypertension: Secondary | ICD-10-CM | POA: Diagnosis not present

## 2014-08-04 DIAGNOSIS — R030 Elevated blood-pressure reading, without diagnosis of hypertension: Secondary | ICD-10-CM

## 2014-08-04 DIAGNOSIS — Z79899 Other long term (current) drug therapy: Secondary | ICD-10-CM | POA: Diagnosis not present

## 2014-08-04 LAB — POCT CBC
Granulocyte percent: 70.7 %G (ref 37–80)
HCT, POC: 49.2 % (ref 43.5–53.7)
Hemoglobin: 16.4 g/dL (ref 14.1–18.1)
Lymph, poc: 1.6 (ref 0.6–3.4)
MCH, POC: 30.2 pg (ref 27–31.2)
MCHC: 33.4 g/dL (ref 31.8–35.4)
MCV: 90.6 fL (ref 80–97)
MID (CBC): 0.2 (ref 0–0.9)
MPV: 6.8 fL (ref 0–99.8)
PLATELET COUNT, POC: 279 10*3/uL (ref 142–424)
POC Granulocyte: 4.5 (ref 2–6.9)
POC LYMPH PERCENT: 25.7 %L (ref 10–50)
POC MID %: 3.6 %M (ref 0–12)
RBC: 5.43 M/uL (ref 4.69–6.13)
RDW, POC: 12.8 %
WBC: 6.4 10*3/uL (ref 4.6–10.2)

## 2014-08-04 LAB — C-REACTIVE PROTEIN: CRP: <0.5 mg/dL (ref <0.60)

## 2014-08-04 LAB — POCT GLYCOSYLATED HEMOGLOBIN (HGB A1C): Hemoglobin A1C: 5.6

## 2014-08-04 LAB — POCT SEDIMENTATION RATE: POCT SED RATE: 16 mm/hr (ref 0–22)

## 2014-08-04 MED ORDER — BUTALBITAL-APAP-CAFFEINE 50-325-40 MG PO TABS: 1.0000 | ORAL_TABLET | Freq: Four times a day (QID) | ORAL | Status: DC | PRN

## 2014-08-04 MED ORDER — TRIAMCINOLONE ACETONIDE 55 MCG/ACT NA AERO: 2.0000 | INHALATION_SPRAY | Freq: Every day | NASAL | Status: DC

## 2014-08-04 MED ORDER — KETOROLAC TROMETHAMINE 60 MG/2ML IM SOLN
60.0000 mg | Freq: Once | INTRAMUSCULAR | Status: AC
  Administered 2014-08-04: 60 mg via INTRAMUSCULAR

## 2014-08-04 MED ORDER — BISOPROLOL FUMARATE 5 MG PO TABS: 5.0000 mg | ORAL_TABLET | Freq: Every day | ORAL | Status: DC

## 2014-08-04 MED ORDER — CETIRIZINE HCL 10 MG PO TABS: 10.0000 mg | ORAL_TABLET | Freq: Every day | ORAL | Status: DC

## 2014-08-04 NOTE — Patient Instructions (Addendum)
Cefalea migraosa (Migraine Headache) Una cefalea migraosa es un dolor intenso y punzante en uno o ambos lados de la cabeza. La migraa puede durar desde 30 minutos hasta varias horas. CAUSAS  No siempre se conoce la causa exacta de la cefalea migraosa. Sin embargo, Production assistant, radio [employer] nervios del cerebro se irritan y liberan ciertas sustancias qumicas que causan inflamacin. Esto ocasiona dolor. Existen tambin ciertos factores que pueden desencadenar las migraas, como los siguientes:  Alcohol.  Fumar.  Estrs.  La menstruacin  Quesos aejados.  Los alimentos o las bebidas que contienen nitratos, glutamato, aspartamo o tiramina.  Falta de sueo.  Chocolate.  Cafena.  Hambre.  Actividad fsica extenuante.  Fatiga.  Medicamentos que se usan para tratar Research officer, political party (nitroglicerina), pldoras anticonceptivas, estrgeno y algunos medicamentos para la hipertensin arterial. SIGNOS Y SNTOMAS  Dolor en uno o ambos lados de la cabeza.  Dolor pulsante o punzante.  Dolor intenso que impide [employer] actividades diarias.  Dolor que se agrava por cualquier actividad fsica.  Nuseas, vmitos o ambos.  Mareos.  Dolor con la exposicin a las luces brillantes, a los ruidos fuertes o la [place].  Sensibilidad general a las luces brillantes, a los ruidos fuertes o a los [employer]. Antes de sufrir una migraa, puede recibir seales de advertencia (aura). Un aura puede incluir:  Ver las luces intermitentes.  Ver puntos brillantes, halos o lneas en zigzag.  Tener una visin en tnel o visin borrosa.  Sensacin de entumecimiento u hormigueo.  Dificultad para hablar  Debilidad muscular. DIAGNSTICO  La cefalea migraosa se diagnostica en funcin de lo siguiente:  Sntomas.  Examen fsico.  Ardelia Mems TC (tomografa computada) o resonancia magntica de la cabeza. Estas pruebas de diagnstico por imagen no pueden diagnosticar las migraas, pero pueden  ayudar a Paramedic otras causas de las cefaleas. TRATAMIENTO Le prescribirn medicamentos para Best boy y las nuseas. Tambin podrn administrarse medicamentos para ayudar a Investment banker, corporate.  INSTRUCCIONES PARA EL CUIDADO EN EL HOGAR  Slo tome medicamentos de venta libre o recetados para Glass blower/designer o Health and safety inspector, segn las indicaciones de su mdico. No se recomienda usar los opiceos a Barrister's clerk.  Cuando tenga la migraa, acustese en un cuarto oscuro y tranquilo  Lleve un registro diario para Neurosurgeon lo que puede provocar las Psychologist, occupational. Por ejemplo, escriba:  Lo que usted come y bebe.  Cunto tiempo duerme.  Algn cambio en su dieta o en los medicamentos.  Limite el consumo de bebidas alcohlicas.  Si fuma, deje de hacerlo.  Duerma entre 7 y 99horas, o segn las recomendaciones del mdico.  Limite el estrs.  [place] luces tenues si le [employer] luces brillantes y la [place]. SOLICITE ATENCIN MDICA DE INMEDIATO SI:   La migraa se hace cada vez ms intensa.  Tiene fiebre.  Presenta rigidez en el cuello.  Tiene prdida de visin.  Presenta debilidad muscular o prdida del control muscular.  Comienza a perder el equilibrio o tiene problemas para caminar.  Sufre mareos o se desmaya.  Tiene sntomas graves que son diferentes a los primeros sntomas. ASEGRESE DE QUE:   Comprende estas instrucciones.  Controlar su afeccin.  Recibir ayuda de inmediato si no mejora o si empeora. Document Released: 02/01/2005 Document Revised: 11/22/2012 [facility] Patient Information 2015 [place], [place]. This information is not intended to replace advice given to you by your health care provider. Make sure you discuss any questions you have with your  health care provider.  Dolor de Training and development officer, preguntas frecuentes y sus respuestas (Headaches, Frequently Asked Questions) CEFALEAS MIGRAOSAS P: Qu es la migraa? Qu la  ocasiona? Cmo puedo tratarla? R: En general, la migraa comienza como un dolor apagado. Luego progresa hacia un dolor, constante, punzante y como un latido. Sentir Scientist, physiological las sienes. Podr sentir Radiographer, therapeutic parte anterior o posterior de la cabeza, o en uno o ambos lados. El dolor suele estar acompaado de una combinacin de:  Nuseas.  Vmitos.  Sensibilidad a la luz y los ruidos. Algunas personas (un 15%) experimentan un aura (ver abajo) antes de un ataque. La causa de la migraa se debe a reacciones qumicas del cerebro. El tratamiento para la migraa puede incluir medicamentos de [place]. Tambin puede incluir tcnicas de [place]. Estas incluyen entrenamientos para la relajacin y biorretroalimentacin.  P: Qu es un aura? R: Alrededor del 15% de las personas con migraa tiene un "aura". Es una seal de sntomas neurolgicos que ocurren antes de un dolor de cabeza por migraas. Podr ver lneas onduladas o irregulares, puntos o luces parpadeantes. Podr experimentar visin de tnel o puntos ciegos en uno o ambos ojos. El aura puede incluir alucinaciones visuales o auditivas (algo que se imagina). Puede incluir trastornos en el olfato (como olores extraos), el tacto o el gusto. Entre otros sntomas se incluyen:  Adormecimiento.  Sensacin de hormigueo.  Dificultad para recordar o Occupational hygienist. Estos episodios neurolgicos pueden durar hasta 60 minutos. Los sntomas desaparecern a medida que el dolor de cabeza comience. P:Qu es un disparador? R: Ciertos factores fsicos o Sports administrator a "disparar" una migraa. Estos son:  Alimentos.  Cambios hormonales.  Clima.  Estrs. Es importante recordar que los disparadores son diferentes entre si. Para ayudar a prevenir ataques de migraas, necesitar descubrir cules son los Psychologist, prison and probation services. Lleve un diario sobre sus dolores de [place]. Este es un buen modo para descubrir los  disparadores. El Sales executive en el momento de hablar con el profesional acerca de su enfermedad. P: El clima afecta en las migraas? R: La luz solar, el calor, la humedad y lo cambios drsticos en la presin Software engineer a, o "disparar" un ataque de migraa en [employer]. Pero estudios han demostrado que el clima no acta como disparador para todas las personas con [place]. P: Cul es la relacin entre la migraa y la hormonas? R: Las hormonas inician y regulan muchas de las funciones corporales. Las hormonas [employer] balance en el cuerpo dentro de los constantes cambios de [place]. Algunas veces, el nivel de hormonas en el cuerpo se desbalancea. Por ejemplo, durante la menstruacin, el embarazo o la [place]. Pueden ser la causa de un ataque de migraa. De hecho, alrededor de tres cuartos de las mujeres con migraa informan que sus ataques estn relacionados con el ciclo menstrual.  P: Aumenta el riesgo de sufrir un choque cardaco en las personas que padecen migraa? R: La probabilidad de que un ataque de migraa ocasione un ataque cardaco es muy remota. Esto no quiere [employer] persona que sufre de migraa no pueda tener un ataque cardaco asociado con ella. En las personas menores de 40 aos, el factor ms comn para un ataque es la [place]. Pero durante la vida de una persona, la ocurrencia de un dolor de cabeza por migraa est asociada con una reduccin en el riesgo de morir por un ataque cerebrovascular.  P: Cules son los medicamentos  para la migraa? R: La medicacin precisa se [place] para tratar el dolor de cabeza una vez que ha comenzado. Son ejemplos, medicamentos de [place], desinflamatorios sin esteroides, ergotamnicos y triptanos.  P: Qu son los triptanos? R: Lo triptanos son Yaakov Guthrie clase de medicamentos abortivos. Son especficos para tratar este problema. Los triptanos son vasoconstrictores. Moderan algunas reacciones qumicas del  cerebro. Los triptanos trabajan como receptores del cerebro. Ayudan a Warehouse manager de un neurotransmisor denominado serotonina. Se cree que las fluctuaciones en los [place] de serotonina son la causa principal de la migraa.  P: Son [employer] de venta libre para la migraa? R: Los medicamentos de [employer] pueden ser efectivos para Paramedic dolores leves a moderados y los sntomas asociados a la [place]. Pero deber consultar a un mdico antes de Charity fundraiser tratamiento para la migraa.  P: Cules son los medicamentos de prevencin de la migraa? R: Se suele denominar tratamiento "profilctico" a los medicamentos para la prevencin de la migraa. Se utilizan para reducir Print production planner, gravedad y duracin de los ataques de [place]. Son ejemplos de medicamentos de prevencin: antiepilpticos, antidepresivos, bloqueadores beta, bloqueadores de los canales de calcio y medicamentos antiinflamatorios sin esteroides. P:  Por qu se utilizan anticonvulsivantes para tratar la migraa? R: Durante los ltimos aos, ha habido un creciente inters en las drogas antiepilpticas para la prevencin de la migraa. A menudo se los conoce como "anticonvulsivantes". La epilepsia y la migraa suceden por reacciones similares en el cerebro.  P:  Por qu se utilizan antidepresivos para tratar la migraa? R: Los antidepresivos tpicamente se [place] para tratar a las personas con depresin. Pueden reducir la frecuencia de la migraa a travs de la regulacin de los niveles qumicos, como la serotonina, en el cerebro.  P:  Por qu se utilizan terapias alternativas para tratar la migraa? R: El trmino "terapias alternativas" suelen utilizarse para describir los tratamientos que se considera que estn por fuera de Occupational hygienist la medicina occidental convencional. Son ejemplos de las terapias alternativas: la acupuntura, la acupresin y el yoga. Otra terapia alternativa comn es la terapia herbal.  Se cree que algunas hierbas ayudan a [employer] de [place]. Siempre consulte con [employer] acerca de las terapias alternativas antes de [place]. Algunos productos herbales contienen arsnico y [place] toxinas. DOLORES DE CABEZA POR TENSIN P: Qu es un dolor de cabeza por tensin? Qu lo ocasiona? Cmo puedo tratarlo? R: Los dolores de cabeza por tensin ocurren al azar. A menudo son el resultado de estrs temporario, ansiedad, fatiga o ira. Los sntomas Environmental education officer en las sienes, una sensacin como de tener una banda alrededor de la cabeza (un dolor que "presiona"). Los sntomas pueden incluir una sensacin de [place], de presin y Engineer, materials de los msculos de la cabeza y el cuello. El dolor comienza en la frente, sienes o en la parte posterior de la cabeza y el cuello. El tratamiento para los dolores de cabeza por tensin puede incluir medicamentos de [place]. Tambin puede incluir tcnicas de [place] con entrenamientos para la relajacin y biorretroalimentacin. CEFALEA EN RACIMOS P: Qu es una cefalea en racimos? Qu la ocasiona? Cmo puedo tratarla? R: La cefalea en racimos toma su nombre debido a que los ataques vienen en grupos. El dolor aparece con poco o ningn aviso. Normalmente ocurre de un lado de la cabeza. Muchas veces el dolor viene acompaado de un lagrimeo u ojo rojo y goteo de la nariz del mismo lado que Chief Technology Officer. Se  cree que la causa es una reaccin en las sustancias qumicas del cerebro. Se describe como el caso ms grave e intenso de cualquier tipo de dolor de cabeza. El tratamiento incluye medicamentos bajo receta y oxgeno. CEFALEA SINUSAL P: Qu es una cefalea sinusal? Qu la ocasiona? Cmo puedo tratarla? R: Cuando se inflama una cavidad en los huesos de la cara y el crneo (sinus) ocasiona un dolor localizado. Esta enfermedad generalmente es el resultado de una reaccin alrgica, un tumor o una infeccin. Si el dolor de cabeza est ocasionado por  un bloqueo del sinus, como una infeccin, probablemente tendr [place]. Una imagen de rayos X confirmar el bloqueo del sinus. El tratamiento indicado por el mdico podr incluir antibiticos para la infeccin, y tambin antihistamnicos o descongestivos.  DOLOR DE CABEZA POR EFECTO "REBOTE" P: Qu es un dolor de cabeza por efecto "rebote"? Qu lo ocasiona? Cmo puedo tratarlo? R: Si se toman medicamentos para el dolor de cabeza muy a menudo puede llevar a la enfermedad conocida como "dolor de cabeza por rebote". Un patrn de abuso de medicamentos para el dolor de cabeza supone tomarlos ms de [employer] por semana o en cantidades excesivas. Esto significa ms que lo que indica el envase o el mdico. Con los dolores de cabeza por rebote, los medicamentos no slo dejan de Engineer, materials sino que adems comienzan a Data processing manager dolores de [place]. Los mdicos tratan los dolores de cabeza por rebote mediante la disminucin del medicamento del que se ha abusado. A veces el medico podr sustituir gradualmente por un tipo diferente de tratamiento o medicacin. Dejar de consumirlo podra ser difcil. El abuso regular de un medicamento aumenta el potencial que se produzcan efectos secundarios graves. Consulte con un mdico si utiliza regularmente medicamentos para Chief Technology Officer de cabeza ms de [address] por semana o ms de lo que indica el envase. PREGUNTAS Y RESPUESTAS ADICIONALES P: Qu es la biorretroalimentacin? R: La biorretroalimentacin es un tratamiento de [place]. La biorretroalimentacin utiliza un equipamiento especial para controlar los movimientos involuntarios del cuerpo y las respuestas fsicas. La biorretroalimentacin controla:  Respiracin.  P[place]so.  Latidos cardacos.  Temperatura.  Tensin muscular.  Actividad cerebrales. La biorretroalimentacin le ayudar a mejorar y Production manager sus ejercicios de Microbiologist. Aprender a Chief Operating Officer las respuestas fsicas relacionadas con el estrs. Una  vez que se dominan las tcnicas no necesitar ms el equipamiento. P: Son hereditarios los dolores de [place]? R: Segn algunas estimaciones, aproximadamente 28 millones de estadounidenses sufren migraa. Cuatro de cada cinco (80%) informan una historia familiar de migraa. Los investigadores no pueden asegurar si se trata de un problema gentico o Patent examiner. A pesar de esto, un nio tiene 50% de probabilidades de sufrir migraa si uno de sus padres la sufre. El nio tiene un 75% de probabilidades si ambos padres la sufren.  P. Puede un nio tener migraa? R: En el momento de ingresar a la escuela secundaria, la mayora de los jvenes han experimentado algn tipo de cefalea. Algunos abordajes o medicamentos seguros y Sports administrator las cefaleas o detenerlas luego de que han comenzado.  Olin Hauser tipo de especialista debe ver para diagnosticar y tratar una cefalea? R: Comience con su mdico de cabecera. Huel [place] de su experiencia y abordaje de las cefaleas. Comente los mtodos de clasificacin, diagnstico y [place]. El profesional decidir si lo derivar a Music therapist, segn los sntomas u otras enfermedades. El hecho de sufrir diabetes, Environmental consultant, Catering manager, puede requerir un abordaje ms complejo. La  National Headache Foundation (Fundacin Nacional para las [place]) Chief Technology Officer, a pedido, Physiological scientist lista de los mdicos que son miembros de [place]. Document Released: 01/15/2008 Document Revised: 04/26/2011 [facility] Patient Information 2015 [place], [place]. This information is not intended to replace advice given to you by your health care provider. Make sure you discuss any questions you have with your health care provider.

## 2014-08-04 NOTE — Progress Notes (Signed)
Subjective:   This chart was scribed for Norberto Sorenson, MD by Andrew Au, ED Scribe. This patient was seen in room 11 and the patient's care was started at 9:06 AM  Patient ID: Garrett Miller, male    DOB: 10-10-1985, 29 y.o.   MRN: [id number]  HPI Chief Complaint  Patient presents with  . discuss the meds.   HPI Comments: Garrett Miller is a 29 y.o. male who presents to the [facility] complaining of Headache.  He saw Dr. Elease Hashimoto earlier this year and was told he might not need BP medication. He was concerned it was causing CP by keeping him on so medication was discontinued. However, he soon developed headaches after stopping Bisoprolol-HCTZ so medication was resumed. I saw patient for GERD symptoms 2 months ago. At that time he was having dysuria, abdominal pain and CP and was attributed to Bisoprolol-HCTZ, which pt elected to stop. Although BP was at an adequate goal he was concerned about HA returning so I placed him on norvasc and stopped bisoprolol-HCTZ. Pt was instructed to take half a tab of norsvac and monitor BP outside office and increase BID as needed.  Pt had normal TSH, PSA, CNP, CBC, neg GC chlamydia probe, repeated normal UA, CK neg H pylori breath test and lipase within the last year. He had ultrasound of abdomen and pelvis 7 months ago which showed he had a cholecystectomy but overall  normal bladder. He was referred to urology in November. Pt went to appointment but results are not scanned into chart  Pt began taking BP medication 6 years ago but began to develop regular headaches 3-4 years. Pt states he had an MRI of head 3-4 years ago which was normal. Pt locates headache to occipital region and states they are present after waking up in the morning and last the entire day. He reports associated pulse in right ear and mild right neck stiffness with headaches. Pt has tried aspirin for headache but states after taking BP medication headache gradually improves. He  denies eyes watering, nausea, emesis, and photophobia. He denies family hx of migraines. Pt checks BP at home and states it has been reading 130-135 with a pulse in the 60's. He has been taking 1 tab of norvasc daily.   Pt has also had left sinus drainage with clear mucous. Pt has tried OTC decongestant without relief.  He denies fever, chills and cough. Pt is not a smoker. He denies hx of asthma and allergy.  Pt is also having nocturia and polyuria during the night which has not stopped since stopping HCTZ but dysuria  has resolved.   Past Medical History  Diagnosis Date  . Hypertension    No Known Allergies Prior to Admission medications   Medication Sig Start Date End Date Taking? Authorizing Provider  amLODipine (NORVASC) 5 MG tablet Take 1 tablet (5 mg total) by mouth daily. 07/22/14  Yes Sherren Mocha, MD  esomeprazole (NEXIUM) 40 MG capsule Take 1 capsule (40 mg total) by mouth daily. 07/22/14  Yes Sherren Mocha, MD  ranitidine (ZANTAC) 150 MG tablet Take 1 tablet (150 mg total) by mouth 2 (two) times daily as needed for heartburn. 07/22/14  Yes Sherren Mocha, MD   Review of Systems  Constitutional: Negative for fever and chills.  Eyes: Negative for photophobia and discharge.  Respiratory: Negative for cough.   Gastrointestinal: Negative for nausea and vomiting.  Endocrine: Positive for polyuria.  Genitourinary: Negative for dysuria.  Musculoskeletal: Positive for neck stiffness.  Neurological: Positive for headaches.   Objective:   Physical Exam  Constitutional: He is oriented to person, place, and time. He appears well-developed and well-nourished. No distress.  HENT:  Head: Normocephalic and atraumatic.  Right Ear: A middle ear effusion is present.  Left Ear: Tympanic membrane is erythematous and retracted. A middle ear effusion is present.  Mouth/Throat: Posterior oropharyngeal erythema present.  Pale boggy nasal mucosa bilaterally  Eyes: Conjunctivae and EOM are normal.  Neck: Neck  supple.  Cardiovascular: Normal rate.   Pulmonary/Chest: Effort normal.  Bronchial breaths sound with intermittent expiratory and inspiratory rhonchi left lower lobe.   Musculoskeletal: Normal range of motion.  Neurological: He is alert and oriented to person, place, and time.  Skin: Skin is warm and dry.  Psychiatric: He has a normal mood and affect. His behavior is normal.  Nursing note and vitals reviewed.  Filed Vitals:   08/04/14 0852  BP: 116/72  Pulse: 60  Temp: 98 F (36.7 C)  TempSrc: Oral  Resp: 16  Height: 5' 10.5" (1.791 m)  Weight: 165 lb 12.8 oz (75.206 kg)  SpO2: 99%   Results for orders placed or performed in visit on 08/04/14  POCT CBC  Result Value Ref Range   WBC 6.4 4.6 - 10.2 K/uL   Lymph, poc 1.6 0.6 - 3.4   POC LYMPH PERCENT 25.7 10 - 50 %L   MID (cbc) 0.2 0 - 0.9   POC MID % 3.6 0 - 12 %M   POC Granulocyte 4.5 2 - 6.9   Granulocyte percent 70.7 37 - 80 %G   RBC 5.43 4.69 - 6.13 M/uL   Hemoglobin 16.4 14.1 - 18.1 g/dL   HCT, POC 45.4 09.8 - 53.7 %   MCV 90.6 80 - 97 fL   MCH, POC 30.2 27 - 31.2 pg   MCHC 33.4 31.8 - 35.4 g/dL   RDW, POC 11.9 %   Platelet Count, POC 279 142 - 424 K/uL   MPV 6.8 0 - 99.8 fL  POCT SEDIMENTATION RATE  Result Value Ref Range   POCT SED RATE 16 0 - 22 mm/hr  POCT glycosylated hemoglobin (Hb A1C)  Result Value Ref Range   Hemoglobin A1C 5.6    [facility] reading (PRIMARY) by Dr. Clelia Croft. CXR normal.  Assessment & Plan:   1. Chronic nonintractable headache, unspecified headache type - spent over half of OV counseling pt that I suspect that he may have an underlyiing chronic HA d/o that was prophylactically prevented w/ BP med of bisoprolol-hctz prior - pt sxs largely started after cessation of bizoprolol-hctz so will restart but it is more likely that the underlying chronic HAs may have been prevented by bisoprolol rather than hctz so restart prior dose of bisoprolol 5 but hold hctz 5.25, stop amlodipine. Try prn fioricet  with caffeine. If sxs continue, would recommend neuro eval and cons brain mri.  2.  Essential hypertension, benign - very well controlled off meds prior to pt likely pre-hypertension vs stage I  3. Medication management   4. Nocturia - continues at 2-3x/evening -  improved but not resolved since stopping hctz - ADDENDUM: pt called in and still having HAs so he really wants to restart hctz despite improvement off as HA is still severe and the lack of the hctz is the only change from when HAs where much better controlled prior.  5. Abnormal lung sounds - maybe from postnasal drip into upper airway -  no h/o bronchospasm and nml cbc and cxr - recheck at f/u - rtc asap if develops any resp sxs.  6. Acute maxillary sinusitis, recurrence not specified     Orders Placed This Encounter  Procedures  . GC/Chlamydia Probe Amp  . DG Chest 2 View    Standing Status: Future     Number of Occurrences: 1     Standing Expiration Date: 08/04/2015    Order Specific Question:  Reason for Exam (SYMPTOM  OR DIAGNOSIS REQUIRED)    Answer:  RUL bronchial breath sounds and rhonchi asymptomatic    Order Specific Question:  Preferred imaging location?    Answer:  External  . PSA  . C-reactive protein  . POCT CBC  . POCT SEDIMENTATION RATE  . POCT glycosylated hemoglobin (Hb A1C)    Meds ordered this encounter  Medications  . ketorolac (TORADOL) injection 60 mg    Sig:   . butalbital-acetaminophen-caffeine (FIORICET) 50-325-40 MG per tablet    Sig: Take 1-2 tablets by mouth every 6 (six) hours as needed for headache.    Dispense:  40 tablet    Refill:  0  . DISCONTD: bisoprolol (ZEBETA) 5 MG tablet    Sig: Take 1 tablet (5 mg total) by mouth daily.    Dispense:  90 tablet    Refill:  3  . triamcinolone (NASACORT) 55 MCG/ACT AERO nasal inhaler    Sig: Place 2 sprays into the nose at bedtime.    Dispense:  1 Inhaler    Refill:  12  . cetirizine (ZYRTEC) 10 MG tablet    Sig: Take 1 tablet (10 mg total) by  mouth at bedtime.    Dispense:  30 tablet    Refill:  11    I personally performed the services described in this documentation, which was scribed in my presence. The recorded information has been reviewed and considered, and addended by me as needed.  Norberto Sorenson, MD MPH

## 2014-08-05 ENCOUNTER — Telehealth: Payer: Self-pay

## 2014-08-05 LAB — PSA: PSA: 0.38 ng/mL (ref <=4.00)

## 2014-08-05 NOTE — Telephone Encounter (Signed)
Pt's wife called wanting Chelle to know. Her husband is feeling not good, and thinks it is the new BP medication he is taking. He would like to be switched to his old BP med-he likes that one is better. Please advise at [phone]

## 2014-08-06 ENCOUNTER — Encounter: Payer: Self-pay | Admitting: Family Medicine

## 2014-08-06 LAB — GC/CHLAMYDIA PROBE AMP
CT PROBE, AMP APTIMA: NEGATIVE
GC PROBE AMP APTIMA: NEGATIVE

## 2014-08-06 MED ORDER — BISOPROLOL-HYDROCHLOROTHIAZIDE 5-6.25 MG PO TABS: 1.0000 | ORAL_TABLET | Freq: Every day | ORAL | Status: DC

## 2014-08-06 NOTE — Telephone Encounter (Signed)
Sherren Mocha, MD at 07/26/2014 7:14 PM     Status: Signed       Expand All Collapse All   Called # and left message asking for clarification - pt was on bisoprolol prior but his HR was to low so I rx'ed him amlodipine 5mg  qd instead but told pt to take only 1/2 tab (=2.5mg ) daily at first as that may be all he needs. So sounds like HA is coming on as med wears off? If he wanted he could take amlodipine 1/2 tab bid as long as his BP is not to low. Or if he just wants me to switch him back to the bisoprolol and d/c the amlodipine that is fine as well.       Left message for pt to call back.

## 2014-08-07 NOTE — Telephone Encounter (Signed)
At pt's visit on 6/19 - the day prior to his wife calling below, we transitioned pt back to bisoprolol 5mg  (as he was initially on bisoprolol-hctz 5-6.25) and stopped the amlodipine 5mg .  So pt is basically on the same medicine that he was initially on prior - it is just that the most recent rx does not have the hctz 6.25 in it which is a very tiny dose of a diuretic - removes extra fluid from the blood and so increases urination.  I really doubt that pt feeling worse has anything to do with not being on the hctz 6.25. Pt was just restarted on the bisoprolol 5 the day prior so it may take several days or even a week or two for things to adjust.  However, if pt does not want to give the bisoprolol 5 a trial, I did send in a refill of the initial bp med - bisoprolol-hctz 5-6.25 - to his pharmacy which he can use instead of amlodipine or bisoprolol alone.   However, I am doubtful that his sxs are related to his BP med and as I discussed w/ pt in detail during his visit - I suspect he may have migraine type HAs which the BP med is prophylactically treating - I do not think his HA when off of BP med is coming from high blood pressure as pt's BP is always fine and pt did have recent cardiology eval as well.  I have placed a referral to a neurologist to eval the HAs further and ordered an MRI of his brain to try to look for other etiologies of his sxs.  If he wants to wait on the MRI until after seeing neuro that is fine.   Clear as mud?

## 2014-08-09 NOTE — Telephone Encounter (Signed)
Spoke with pt's wife, advised message from Dr. Clelia Croft. SHe will have him give the medication some more time and will look forward to Neuro referral.

## 2014-08-20 ENCOUNTER — Telehealth: Payer: Self-pay

## 2014-08-21 NOTE — Telephone Encounter (Signed)
Error

## 2014-10-11 ENCOUNTER — Ambulatory Visit: Payer: BLUE CROSS/BLUE SHIELD | Admitting: Neurology

## 2014-10-18 ENCOUNTER — Ambulatory Visit: Payer: BLUE CROSS/BLUE SHIELD | Admitting: Neurology

## 2014-11-26 ENCOUNTER — Ambulatory Visit: Payer: BLUE CROSS/BLUE SHIELD | Admitting: Neurology

## 2014-12-09 ENCOUNTER — Ambulatory Visit (INDEPENDENT_AMBULATORY_CARE_PROVIDER_SITE_OTHER): Payer: BLUE CROSS/BLUE SHIELD | Admitting: Internal Medicine

## 2014-12-09 VITALS — BP 110/76 | HR 60 | Temp 98.2°F | Resp 16 | Ht 70.5 in | Wt 171.1 lb

## 2014-12-09 DIAGNOSIS — R300 Dysuria: Secondary | ICD-10-CM | POA: Diagnosis not present

## 2014-12-09 DIAGNOSIS — H00033 Abscess of eyelid right eye, unspecified eyelid: Secondary | ICD-10-CM

## 2014-12-09 LAB — POCT URINALYSIS DIP (MANUAL ENTRY)
Bilirubin, UA: NEGATIVE
GLUCOSE UA: NEGATIVE
Ketones, POC UA: NEGATIVE
LEUKOCYTES UA: NEGATIVE
NITRITE UA: NEGATIVE
Protein Ur, POC: NEGATIVE
Spec Grav, UA: 1.01
Urobilinogen, UA: 0.2
pH, UA: 7

## 2014-12-09 LAB — POC MICROSCOPIC URINALYSIS (UMFC): MUCUS RE: ABSENT

## 2014-12-09 MED ORDER — DOXYCYCLINE HYCLATE 100 MG PO TABS: 100.0000 mg | ORAL_TABLET | Freq: Two times a day (BID) | ORAL | Status: DC

## 2014-12-09 NOTE — Progress Notes (Signed)
Subjective:  This chart was scribed for Ellamae Siaobert Neda Willenbring, MD by [facility] LLC[facility]adim Abu Hashem, medical scribe at [facility].The patient was seen in exam room 04 and the patient's care was started at 6:18 PM.   Patient ID: Garrett Miller, male    DOB: 03-03-1985, 29 y.o.   MRN: [id number] Chief Complaint  Patient presents with  . Stye    Right Eye  . Burning with Urination   HPI  HPI Comments: Garrett Miller is a 29 y.o. male who presents [facility] complaining of a red swelling on his right upper eyelid for the past 2 months. Wakes up with a white discharge in his eye. He did use OTC eye drops. A few months ago he had a stye in his left eye which was treated by Dr. with antibiotics and responded.  He also complains of intermittent dysuria for the past two weeks. Increased urinary frequency, wakes 3 times during the night. No penile discharge, no new sexual encounter, no hematuria, no genital rash. Past 6 months he has had 2 sexual partners. No prior prostate or urinary tract infections. No fever.  Past Medical History  Diagnosis Date  . Hypertension    Prior to Admission medications   Medication Sig Start Date End Date Taking? Authorizing Provider  esomeprazole (NEXIUM) 40 MG capsule Take 1 capsule (40 mg total) by mouth daily. 07/22/14  Yes Sherren MochaEva N Shaw, MD  lisinopril (PRINIVIL,ZESTRIL) 10 MG tablet TK 1 T PO QD 10/30/14  Yes Historical Provider, MD   No Known Allergies   Review of Systems  Eyes: Positive for discharge and redness.  Genitourinary: Positive for dysuria and frequency. Negative for hematuria, discharge and genital sores.  Skin: Negative for rash.       Objective:  Physical Exam  Constitutional: He is oriented to person, place, and time. He appears well-developed and well-nourished. No distress.  HENT:  Head: Normocephalic and atraumatic.  Eyes: Pupils are equal, round, and reactive to light.  The right upper lid  has an external pointing  hordeolum that is tender and spreading into the upper lid Pus in the inner canthus Conjunctiva not injected  Neck: Normal range of motion.  Cardiovascular: Normal rate and regular rhythm.   Pulmonary/Chest: Effort normal. No respiratory distress.  Musculoskeletal: Normal range of motion.  Neurological: He is alert and oriented to person, place, and time.  Skin: Skin is warm and dry.  Psychiatric: He has a normal mood and affect. His behavior is normal.  Nursing note and vitals reviewed. BP 110/76 mmHg  Pulse 60  Temp(Src) 98.2 F (36.8 C) (Oral)  Resp 16  Ht 5' 10.5" (1.791 m)  Wt 171 lb 2 oz (77.622 kg)  BMI 24.20 kg/m2  SpO2 99% Results for orders placed or performed in visit on 12/09/14  POCT urinalysis dipstick  Result Value Ref Range   Color, UA yellow yellow   Clarity, UA clear clear   Glucose, UA negative negative   Bilirubin, UA negative negative   Ketones, POC UA negative negative   Spec Grav, UA 1.010    Blood, UA trace-intact (A) negative   pH, UA 7.0    Protein Ur, POC negative negative   Urobilinogen, UA 0.2    Nitrite, UA Negative Negative   Leukocytes, UA Negative Negative  POCT Microscopic Urinalysis ([facility])  Result Value Ref Range   WBC,UR,HPF,POC None None WBC/hpf   RBC,UR,HPF,POC None None RBC/hpf   Bacteria Few (A) None,  Too numerous to count   Mucus Absent Absent   Epithelial Cells, UR Per Microscopy Few (A) None, Too numerous to count cells/hpf       Assessment & Plan:  Dysuria - Plan: GC/Chlamydia Probe Amp 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 doxycycline (VIBRA-TABS) 100 MG tablet    Sig: Take 1 tablet (100 mg total) by mouth 2 (two) times daily.    Dispense:  20 tablet    Refill:  0  Hot compresses twice a day Follow-up 2 weeks if not resolved for ophthalmology referral Call with URiprobe results  I have completed the patient encounter in its entirety as documented by  the scribe, with editing by me where necessary. Anel Creighton P. Merla Riches, M.D. By signing my name below, I, Nadim Abuhashem, attest that this documentation has been prepared under the direction and in the presence of Ellamae Sia, MD.  Electronically Signed: Conchita Paris, medical scribe. 12/09/2014, 6:18 PM.

## 2014-12-11 LAB — GC/CHLAMYDIA PROBE AMP
CT PROBE, AMP APTIMA: NEGATIVE
GC Probe RNA: NEGATIVE

## 2014-12-16 ENCOUNTER — Other Ambulatory Visit: Payer: Self-pay | Admitting: Family Medicine

## 2015-01-31 ENCOUNTER — Ambulatory Visit (INDEPENDENT_AMBULATORY_CARE_PROVIDER_SITE_OTHER): Payer: BLUE CROSS/BLUE SHIELD | Admitting: Neurology

## 2015-01-31 ENCOUNTER — Encounter: Payer: Self-pay | Admitting: Neurology

## 2015-01-31 VITALS — BP 130/70 | HR 64 | Ht 70.0 in | Wt 175.0 lb

## 2015-01-31 DIAGNOSIS — R519 Headache, unspecified: Secondary | ICD-10-CM

## 2015-01-31 DIAGNOSIS — R51 Headache: Secondary | ICD-10-CM

## 2015-01-31 DIAGNOSIS — I10 Essential (primary) hypertension: Secondary | ICD-10-CM

## 2015-01-31 NOTE — Progress Notes (Signed)
NEUROLOGY CONSULTATION NOTE  Garrett Miller MRN: [id number] DOB: 02-04-1986  Referring provider: Dr. Norberto Sorenson Primary care provider: Dr. Norberto Sorenson  Reason for consult:  headaches  Dear Dr Clelia Croft:  Thank you for your kind referral of Garrett Miller for consultation of the above symptoms. Although his history is well known to you, please allow me to reiterate it for the purpose of our medical record. The patient was accompanied to the clinic by his girlfriend who also provides collateral information. Spanish interpreter line was used to help with translation [phone] Garrett Miller) Records and images were personally reviewed where available.  HISTORY OF PRESENT ILLNESS: This is a 29 year old right-handed man with a history of hypertension, presenting for evaluation of new onset headaches that started 3-4 months ago. He reports that headaches are either in the frontal or occipital regions, with 3/10 throbbing pain, sensitive to sounds, occurring 2-3 times a week. Sometimes he wakes up with a headache. Headaches last a few hours, he rarely takes medications, except when pain increases and takes Tylenol. He reports that headaches started after he was started on Lisinopril 3 months ago. He reports that during the process of changing from one medication to another, he started having headaches. On review of records from PCP, he reported headaches in June 2016 after stopping Bisprolol-HCTZ due to chest pains. Due to concern that headache was returning, he was started on Novasc. Per records, he started taking BP medication 6 years ago but began to develop regular headaches 3-4 years ago and was evaluated by a neurologist in [place], MRI brain reported normal. The patient reports he was given a prescription for headaches, but did not take it and headaches resolved spontaneously, which he attributed to stress. On his return to PCP after Norvasc, it was felt that he has an underlying headache disorder that  beta-blockers were likely helping with, he was started back on Bisoprolol-HCTZ and Norvasc was discontinued. There are no records on EP[facility] when patient was switched, but he reports that he has been taking Lisinopril for the past 3 months and is not taking Bisoprolol-HCTZ anymore.   He denies any dizziness, vision changes or visual obscurations, dysarthria, dysphagia, neck pain, focal numbness/tingling/weakness. He has chronic back pain. He has been having urinary frequency and dysuria but reports urinalysis has been negative. He usually gets 6-7 hours of unrefreshing sleep, with headaches worse when he gets less sleep. He denies any history of head injuries, no family history of headaches.   Laboratory Data: Lab Results  Component Value Date   WBC 6.4 08/04/2014   HGB 16.4 08/04/2014   HCT 49.2 08/04/2014   MCV 90.6 08/04/2014   PLT 228 12/01/2013     Chemistry      Component Value Date/Time   NA 137 06/03/2014 1857   K 4.3 06/03/2014 1857   CL 102 06/03/2014 1857   CO2 28 06/03/2014 1857   BUN 17 06/03/2014 1857   CREATININE 0.88 06/03/2014 1857      Component Value Date/Time   CALCIUM 9.2 06/03/2014 1857   ALKPHOS 66 06/03/2014 1857   AST 25 06/03/2014 1857   ALT 31 06/03/2014 1857   BILITOT 0.7 06/03/2014 1857     Lab Results  Component Value Date   POCTSEDRATE 16 08/04/2014    PAST MEDICAL HISTORY: Past Medical History  Diagnosis Date  . Hypertension     PAST SURGICAL HISTORY: Past Surgical History  Procedure Laterality Date  . Cholecystectomy  2013  MEDICATIONS: Current Outpatient Prescriptions on File Prior to Visit  Medication Sig Dispense Refill  . lisinopril (PRINIVIL,ZESTRIL) 10 MG tablet TK 1 T PO QD  4   No current facility-administered medications on file prior to visit.    ALLERGIES: No Known Allergies  FAMILY HISTORY: Family History  Problem Relation Age of Onset  . Hypertension Mother     SOCIAL HISTORY: Social History   Social  History  . Marital Status: Single    Spouse Name: N/A  . Number of Children: 1  . Years of Education: N/A   Occupational History  . Warehouse    Social History Main Topics  . Smoking status: Never Smoker   . Smokeless tobacco: Never Used  . Alcohol Use: No  . Drug Use: No  . Sexual Activity: Yes   Other Topics Concern  . Not on file   Social History Narrative   Marital status: single; dating seriously x 10 years.  From [place]; [place] since 10 years.      Children: one child.      Employment: Naval architect.    REVIEW OF SYSTEMS: Constitutional: No fevers, chills, or sweats, no generalized fatigue, change in appetite Eyes: No visual changes, double vision, eye pain Ear, nose and throat: No hearing loss, ear pain, nasal congestion, sore throat Cardiovascular: No chest pain, palpitations Respiratory:  No shortness of breath at rest or with exertion, wheezes GastrointestinaI: No nausea, vomiting, diarrhea, abdominal pain, fecal incontinence Genitourinary:  + dysuria, urinary frequency Musculoskeletal:  No neck pain,+ back pain Integumentary: No rash, pruritus, skin lesions Neurological: as above Psychiatric: No depression, insomnia, anxiety Endocrine: No palpitations, fatigue, diaphoresis, mood swings, change in appetite, change in weight, increased thirst Hematologic/Lymphatic:  No anemia, purpura, petechiae. Allergic/Immunologic: no itchy/runny eyes, nasal congestion, recent allergic reactions, rashes  PHYSICAL EXAM: Filed Vitals:   01/31/15 0837  BP: 130/70  Pulse: 64   General: No acute distress Head:  Normocephalic/atraumatic Eyes: Fundoscopic exam shows bilateral sharp discs, no vessel changes, exudates, or hemorrhages Neck: supple, no paraspinal tenderness, full range of motion Back: No paraspinal tenderness Heart: regular rate and rhythm Lungs: Clear to auscultation bilaterally. Vascular: No carotid bruits. Skin/Extremities: No rash, no edema Neurological  Exam: Mental status: alert and oriented to person, place, and time, no dysarthria or aphasia, Fund of knowledge is appropriate.  Recent and remote memory are intact.  Attention and concentration are normal.    Able to name objects and repeat phrases. Cranial nerves: CN I: not tested CN II: pupils equal, round and reactive to light, visual fields intact, fundi unremarkable. CN III, IV, VI:  full range of motion, no nystagmus, no ptosis CN V: facial sensation intact CN VII: upper and lower face symmetric CN VIII: hearing intact to finger rub CN IX, X: gag intact, uvula midline CN XI: sternocleidomastoid and trapezius muscles intact CN XII: tongue midline Bulk & Tone: normal, no fasciculations. Motor: 5/5 throughout with no pronator drift. Sensation: intact to light touch, cold, pin, vibration and joint position sense.  No extinction to double simultaneous stimulation.  Romberg test negative Deep Tendon Reflexes: +2 throughout, no ankle clonus Plantar responses: downgoing bilaterally Cerebellar: no incoordination on finger to nose, heel to shin. No dysdiadochokinesia Gait: narrow-based and steady, able to tandem walk adequately. Tremor: none  IMPRESSION: This is a 29 year old right-handed man with a history of hypertension, presenting with worsening headaches that started 6 months ago but have been worsening over the past 3 months. Neurological exam normal.  He reports similar symptoms 3 years ago, but had been headache-free until 6 months ago. He felt that symptoms were related to his BP medication, I discussed switching to a different medication under the guidance of his cardiologist/PCP, however he does not want to switch anymore as he has difficulties each time he starts a new medication. The headaches do have some migrainous features, however since worsening, would do an MRI brain without contrast to assess for underlying structural abnormality. We discussed starting headache prophylactic  medication that may help with sleep as well, such as nortriptyline, however he is hesitant to start another medication. He will keep a calendar of his headaches and will follow-up in 2-3 months, at which point we may consider starting medication.   Thank you for allowing me to participate in the care of this patient. Please do not hesitate to call for any questions or concerns.   Patrcia DollyKaren Aquino, M.D.  CC: Dr. Clelia CroftShaw

## 2015-01-31 NOTE — Patient Instructions (Addendum)
1. Schedule MRI brain without contrast 2. Keep a calendar of your headaches 3. Follow-up in 2-3 months

## 2015-02-10 DIAGNOSIS — I10 Essential (primary) hypertension: Secondary | ICD-10-CM | POA: Insufficient documentation

## 2015-02-10 DIAGNOSIS — R51 Headache: Principal | ICD-10-CM

## 2015-02-10 DIAGNOSIS — R519 Headache, unspecified: Secondary | ICD-10-CM | POA: Insufficient documentation

## 2015-02-13 ENCOUNTER — Telehealth: Payer: Self-pay | Admitting: Family Medicine

## 2015-02-13 MED ORDER — NORTRIPTYLINE HCL 10 MG PO CAPS: ORAL_CAPSULE | ORAL | Status: DC

## 2015-02-13 NOTE — Telephone Encounter (Signed)
Start nortriptyline 10mg : Take 1 capsule qhs x 1 week, then increase to 2 caps qhs. Main side effect is drowsiness, if he is too drowsy in the daytime, take it at dinner instead of bedtime. See how he feels first before driving. Thanks

## 2015-02-13 NOTE — Telephone Encounter (Signed)
Called patient and explained dosing schedule and possible side effect of dizziness. Did advise him to take at dinner time if he is feeling drowsy throughout the day, and also driving precautions if dizzy.

## 2015-02-13 NOTE — Telephone Encounter (Signed)
Pt was returning your call.

## 2015-02-13 NOTE — Telephone Encounter (Signed)
I spoke with patient about his ins denying coverage for MRI. MRI appt has been cancelled for tomorrow. He wants to try medication now since he states the headaches are still no better.

## 2015-02-14 ENCOUNTER — Ambulatory Visit (HOSPITAL_COMMUNITY): Admission: RE | Admit: 2015-02-14 | Payer: BLUE CROSS/BLUE SHIELD | Source: Ambulatory Visit

## 2015-02-14 ENCOUNTER — Telehealth: Payer: Self-pay | Admitting: Neurology

## 2015-02-14 NOTE — Telephone Encounter (Signed)
Pt wife called and states that the patient wants to someone about medication he wants to take medication at night and if it will effect blood pressure his blood pressure has been running high [phone]

## 2015-02-14 NOTE — Telephone Encounter (Signed)
Lmovm to rtn my call. 

## 2015-02-14 NOTE — Telephone Encounter (Signed)
Patient's girlfriend returned my call. Did tell her it's ok for patient to take med at night with his other medications & med shouldn't effect his bp.

## 2015-02-21 ENCOUNTER — Telehealth: Payer: Self-pay | Admitting: Neurology

## 2015-02-21 NOTE — Telephone Encounter (Signed)
Left msg on vm for [facility] to rtn call.

## 2015-02-21 NOTE — Telephone Encounter (Signed)
VM-Gloria with [facility] called in regards to PT and would like a call back at [phone] EXT [id number]/Dawn

## 2015-04-17 ENCOUNTER — Ambulatory Visit (INDEPENDENT_AMBULATORY_CARE_PROVIDER_SITE_OTHER): Payer: 59 | Admitting: Family Medicine

## 2015-04-17 VITALS — BP 120/66 | HR 79 | Temp 98.0°F | Resp 16 | Ht 70.25 in | Wt 172.0 lb

## 2015-04-17 DIAGNOSIS — R079 Chest pain, unspecified: Secondary | ICD-10-CM

## 2015-04-17 NOTE — Progress Notes (Signed)
Discussed patient with Porfirio Oar PA-C.  Patient is been having chest pain and upper arm pains bilaterally. It hurts when he lifts or moves things. He does have hypertension so he was concerned to get his heart checked out. The nature of the history does not sound cardiac, more mechanical by history. No family history of premature heart disease. He has a normal appearing EKG. I examined the patient myself, but discussed with the PA and agree with treatment plan.

## 2015-04-17 NOTE — Patient Instructions (Signed)
This chest pain appears to me musculoskeletal, NOT cardiac. Make sure that you get plenty of water to drink, especially when you do extra physical work. Use acetaminophen (Tylenol) as needed for the muscle soreness. If you develop a crushing pain or heaviness in your chest, or develop shortness of breath or nausea associated with the pain, or if it's not resolving, please come back for re-evaluation.

## 2015-04-17 NOTE — Progress Notes (Signed)
Patient ID: Garrett Miller, male    DOB: [birth year], 30 y.o.   MRN: [id number]  PCP: No PCP Per Patient  Subjective:   Chief Complaint  Patient presents with  . Chest Pain    x 1 week with tingling in both hands and shoulders    HPI Presents for evaluation of chest pain x 1 week.  He is worried because he has HTN, and wants to make sure this isn't due to his heart.  He isn't able to qualify the pain, but says it is in the entire front of the chest, shoulders, upper back, neck, arms, wrists and hands, and occurs primarily when he is doing work like pushing, pulling and lifting. He works in a [employer] architect, [employer], and does a lot of heavy lifting. He also is building a privacy fence at home and has done a lot of hard work related to that.  He took 2 ibuprofen last night without relief.  No associated, SOB, dizziness, nausea, vomiting, diarrhea, urinary changes. No jaw pain. To me he denies weakness and paresthesias. Headache is not worse/more frequent, and is managed by neurology.  No family history of heart disease or early sudden death.  He has had cholesterol checked previously, but doesn't recall the results. He was not started on a lipid lowering drug.  Describes himself as anxious, a Product/process development scientist.    Review of Systems  Constitutional: Negative.   Eyes: Negative for photophobia, pain and visual disturbance.  Respiratory: Negative for apnea, cough, choking, chest tightness, shortness of breath, wheezing and stridor.   Cardiovascular: Positive for chest pain. Negative for palpitations and leg swelling.  Gastrointestinal: Negative for nausea, vomiting and diarrhea.  Genitourinary: Negative for dysuria, urgency, frequency and hematuria.  Musculoskeletal: Positive for myalgias and arthralgias. Negative for back pain, joint swelling, gait problem, neck pain and neck stiffness.  Skin: Negative for rash.  Neurological: Positive for headaches. Negative for dizziness, weakness,  light-headedness and numbness.  Hematological: Negative for adenopathy. Does not bruise/bleed easily.  Psychiatric/Behavioral: Negative for suicidal ideas, sleep disturbance and self-injury. The patient is nervous/anxious.        Patient Active Problem List   Diagnosis Date Noted  . Worsening headaches 02/10/2015  . Essential hypertension 02/10/2015  . Chest pain 02/22/2014  . HTN (hypertension) 08/22/2013     Prior to Admission medications   Medication Sig Start Date End Date Taking? Authorizing Provider  lisinopril (PRINIVIL,ZESTRIL) 10 MG tablet TK 1 T PO QD 10/30/14  Yes Historical Provider, MD  nortriptyline (PAMELOR) 10 MG capsule Take 1 capsule at bedtime for 1 week, then increase to 2 capsules at bedtime & continue. 02/13/15  Yes Van Clines, MD     No Known Allergies     Objective:  Physical Exam  Constitutional: He is oriented to person, place, and time. He appears well-developed and well-nourished. He is active and cooperative. No distress.  BP 120/66 mmHg  Pulse 79  Temp(Src) 98 F (36.7 C) (Oral)  Resp 16  Ht 5' 10.25" (1.784 m)  Wt 172 lb (78.019 kg)  BMI 24.51 kg/m2  SpO2 99%  HENT:  Head: Normocephalic and atraumatic.  Right Ear: Hearing normal.  Left Ear: Hearing normal.  Eyes: Conjunctivae are normal. No scleral icterus.  Neck: Normal range of motion. Neck supple. No thyromegaly present.  Cardiovascular: Normal rate, regular rhythm and normal heart sounds.   Pulses:      Radial pulses are 2+ on the right side, and 2+ on the  left side.  Pulmonary/Chest: Effort normal and breath sounds normal. He exhibits tenderness. He exhibits no mass.    Musculoskeletal:       Right shoulder: Normal.       Left shoulder: Normal.       Right elbow: Normal.      Left elbow: Normal.       Right wrist: Normal.       Left wrist: Normal.       Cervical back: He exhibits tenderness. He exhibits normal range of motion and no bony tenderness.       Thoracic back:  Normal.       Lumbar back: Normal.       Right upper arm: Normal.       Left upper arm: Normal.       Right forearm: Normal.       Left forearm: Normal.       Right hand: Normal.       Left hand: Normal.  ROM of the shoulders causes "soreness" in the trapezius bilaterally.  Lymphadenopathy:       Head (right side): No tonsillar, no preauricular, no posterior auricular and no occipital adenopathy present.       Head (left side): No tonsillar, no preauricular, no posterior auricular and no occipital adenopathy present.    He has no cervical adenopathy.       Right: No supraclavicular adenopathy present.       Left: No supraclavicular adenopathy present.  Neurological: He is alert and oriented to person, place, and time. He has normal strength. No sensory deficit.  Skin: Skin is warm, dry and intact. No rash noted. No cyanosis or erythema. Nails show no clubbing.  Psychiatric: His speech is normal and behavior is normal. His mood appears anxious (mildly). His affect is not angry, not blunt, not labile and not inappropriate. He does not exhibit a depressed mood.   EKG reviewed with Dr. Hopper. NSR. No ischAlwyn Rena or LVH or dysrhythmia.        Assessment & Plan:   1. Chest pain, unspecified chest pain type Musculoskeletal chest wall pain, likely due to the increased physical activity involved in building the privacy fence. Hydrate. NSAIDS. Anticipatory guidance. - EKG 12-Lead   Fernande Bras, PA-C Physician Assistant-Certified [facility]

## 2015-05-09 ENCOUNTER — Ambulatory Visit: Payer: BLUE CROSS/BLUE SHIELD | Admitting: Neurology

## 2015-06-06 ENCOUNTER — Ambulatory Visit (INDEPENDENT_AMBULATORY_CARE_PROVIDER_SITE_OTHER): Payer: Self-pay | Admitting: Neurology

## 2015-06-06 ENCOUNTER — Ambulatory Visit: Payer: BLUE CROSS/BLUE SHIELD | Admitting: Neurology

## 2015-06-06 ENCOUNTER — Encounter: Payer: Self-pay | Admitting: Neurology

## 2015-06-06 VITALS — BP 118/80 | HR 106 | Resp 16 | Wt 173.0 lb

## 2015-06-06 DIAGNOSIS — R51 Headache: Secondary | ICD-10-CM

## 2015-06-06 DIAGNOSIS — H93A1 Pulsatile tinnitus, right ear: Secondary | ICD-10-CM | POA: Insufficient documentation

## 2015-06-06 DIAGNOSIS — R519 Headache, unspecified: Secondary | ICD-10-CM

## 2015-06-06 MED ORDER — NORTRIPTYLINE HCL 25 MG PO CAPS: ORAL_CAPSULE | ORAL | Status: DC

## 2015-06-06 NOTE — Patient Instructions (Addendum)
1. Increase nortripytline: Start taking nortriptyline 25mg  - 1 capsule at night for 1 week, then increase to 2 capsules at night 2. Minimize intake of over the counter medication to 2-3 times a week 3. Schedule MRI brain with and without contrast once able 4. Follow-up in 4 months

## 2015-06-06 NOTE — Progress Notes (Signed)
NEUROLOGY FOLLOW UP OFFICE NOTE  Garrett Primassteban Havener [id number]  HISTORY OF PRESENT ILLNESS: I had the pleasure of seeing Garrett Miller in follow-up in the neurology clinic on 06/06/2015.  The patient was last seen 4 months ago for worsening headaches. A Spanish medical interpreter helps with translation. On his initial visit in December 2016, he reported new onset headaches that started around August or so. Neurological exam normal, he was started on low dose nortriptyline for headache prophylaxis. His insurance company would not approve MRI brain. He reports that he has lost his job and insurance since last visit. He continues to have the same headaches over the frontal or occipital regions occurring on a near-daily basis, for bad headaches he takes 2 Excedrin migraine with good response, however takes this 3-4 times a week. He takes nortriptyline 20mg  qhs without side effects and feels that it is not helping. He sometimes wakes up with a headaches. He also has associated dizziness, no nausea/vomiting/photo/phonophobia. He has right-sided pulsatile tinnitus that is present even without headaches, no hearing loss. He usually gets 5-6 hours of sleep and does not feel rested. He reports feeling anxiety and stress from losing his job and with his wife being pregnant due in August. He denies any vision changes, focal numbness/tingling/weakness, no head injury.   HPI: This is a 30 yo RH man with a history of hypertension, with new onset headaches that started in August 2016. He reports that headaches are either in the frontal or occipital regions, with 3/10 throbbing pain, sensitive to sounds, occurring 2-3 times a week. Sometimes he wakes up with a headache. Headaches last a few hours, he rarely takes medications, except when pain increases and takes Tylenol. He reports that headaches started after he was started on Lisinopril 3 months ago. He reports that during the process of changing from one medication to  another, he started having headaches. On review of records from PCP, he reported headaches in June 2016 after stopping Bisprolol-HCTZ due to chest pains. Due to concern that headache was returning, he was started on Novasc. Per records, he started taking BP medication 6 years ago but began to develop regular headaches 3-4 years ago and was [place], MRI brain reported normal. The patient reports he was given a prescription for headaches, but did not take it and headaches resolved spontaneously, which he attributed to stress. On his return to PCP after Norvasc, it was felt that he has an underlying headache disorder that beta-blockers were likely helping with, he was started back on Bisoprolol-HCTZ and Norvasc was discontinued. There are no records on [facility] when patient was switched, but he reports that he has been taking Lisinopril for the past 3 months and is not taking Bisoprolol-HCTZ anymore. He denies any history of head injuries, no family history of headaches.   PAST MEDICAL HISTORY: Past Medical History  Diagnosis Date  . Hypertension     MEDICATIONS: Current Outpatient Prescriptions on File Prior to Visit  Medication Sig Dispense Refill  . lisinopril (PRINIVIL,ZESTRIL) 10 MG tablet TK 1 T PO QD  4  . nortriptyline (PAMELOR) 10 MG capsule Take 1 capsule at bedtime for 1 week, then increase to 2 capsules at bedtime & continue. (Patient taking differently: Take 2 capsules at bedtime) 60 capsule 3   No current facility-administered medications on file prior to visit.    ALLERGIES: No Known Allergies  FAMILY HISTORY: Family History  Problem Relation Age of Onset  . Hypertension Mother  SOCIAL HISTORY: Social History   Social History  . Marital Status: Single    Spouse Name: N/A  . Number of Children: 1  . Years of Education: N/A   Occupational History  . Warehouse    Social History Main Topics  . Smoking status: Never Smoker   .  Smokeless tobacco: Never Used  . Alcohol Use: No  . Drug Use: No  . Sexual Activity: Yes   Other Topics Concern  . Not on file   Social History Narrative   Marital status: single; dating seriously x 10 years.  From [place]; [place] since 10 years.      Children: one child.      Employment: Naval architect.    REVIEW OF SYSTEMS: Constitutional: No fevers, chills, or sweats, no generalized fatigue, change in appetite Eyes: No visual changes, double vision, eye pain Ear, nose and throat: No hearing loss, ear pain, nasal congestion, sore throat Cardiovascular: No chest pain, palpitations Respiratory:  No shortness of breath at rest or with exertion, wheezes GastrointestinaI: No nausea, vomiting, diarrhea, abdominal pain, fecal incontinence Genitourinary:  No dysuria, urinary retention or frequency Musculoskeletal:  No neck pain, back pain Integumentary: No rash, pruritus, skin lesions Neurological: as above Psychiatric: No depression, insomnia, +anxiety Endocrine: No palpitations, fatigue, diaphoresis, mood swings, change in appetite, change in weight, increased thirst Hematologic/Lymphatic:  No anemia, purpura, petechiae. Allergic/Immunologic: no itchy/runny eyes, nasal congestion, recent allergic reactions, rashes  PHYSICAL EXAM: Filed Vitals:   06/06/15 0906  BP: 118/80  Pulse: 106  Resp: 16   General: No acute distress Head:  Normocephalic/atraumatic Neck: supple, no paraspinal tenderness, full range of motion Heart:  Regular rate and rhythm Lungs:  Clear to auscultation bilaterally Back: No paraspinal tenderness Skin/Extremities: No rash, no edema Neurological Exam: alert and oriented to person, place, and time. No aphasia or dysarthria. Fund of knowledge is appropriate.  Recent and remote memory are intact.  Attention and concentration are normal.    Able to name objects and repeat phrases. Cranial nerves: Pupils equal, round, reactive to light.  Fundoscopic exam unremarkable, no  papilledema. Extraocular movements intact with no nystagmus. Visual fields full. Facial sensation intact. No facial asymmetry. Tongue, uvula, palate midline.  Motor: Bulk and tone normal, muscle strength 5/5 throughout with no pronator drift.  Sensation to light touch intact.  No extinction to double simultaneous stimulation.  Deep tendon reflexes +2 throughout, toes downgoing.  Finger to nose testing intact.  Gait narrow-based and steady, able to tandem walk adequately.  Romberg negative.  IMPRESSION: This is a 30 yo RH man with a history of hypertension with new onset headaches, right pulsatile tinnitus. Neurological exam normal. Headaches likely tension-type headaches, however would recommend an MRI brain with and without contrast to assess for underlying structural abnormality, particularly with pulsatile tinnitus. He will increase nortriptyline to  qhs, hopefully this will help with sleep as well.There may be a component of medication overuse headaches, he was advised to minimize Excedrin migraine intake to 2-3 a week to avoid rebound headaches. He will follow-up in 4 months.   Thank you for allowing me to participate in his care.  Please do not hesitate to call for any questions or concerns.  The duration of this appointment visit was 25 minutes of face-to-face time with the patient.  Greater than 50% of this time was spent in counseling, explanation of diagnosis, planning of further management, and coordination of care.   Patrcia Dolly, M.D.

## 2015-06-24 ENCOUNTER — Observation Stay (HOSPITAL_COMMUNITY)
Admission: EM | Admit: 2015-06-24 | Discharge: 2015-06-25 | Disposition: A | Discharge status: Home / Self Care | Payer: Medicaid Other | Attending: General Surgery | Admitting: General Surgery

## 2015-06-24 ENCOUNTER — Encounter (HOSPITAL_COMMUNITY)
Admission: EM | Disposition: A | Discharge status: Home / Self Care | Payer: Self-pay | Source: Home / Self Care | Attending: Emergency Medicine

## 2015-06-24 ENCOUNTER — Other Ambulatory Visit (HOSPITAL_COMMUNITY): Payer: Self-pay | Admitting: Family Medicine

## 2015-06-24 ENCOUNTER — Encounter (HOSPITAL_COMMUNITY): Payer: Self-pay | Admitting: Emergency Medicine

## 2015-06-24 ENCOUNTER — Observation Stay (HOSPITAL_COMMUNITY): Payer: Medicaid Other | Admitting: Certified Registered Nurse Anesthetist

## 2015-06-24 ENCOUNTER — Ambulatory Visit (INDEPENDENT_AMBULATORY_CARE_PROVIDER_SITE_OTHER): Payer: Self-pay | Admitting: Family Medicine

## 2015-06-24 ENCOUNTER — Ambulatory Visit (HOSPITAL_COMMUNITY)
Admission: RE | Admit: 2015-06-24 | Discharge: 2015-06-24 | Disposition: A | Discharge status: Home / Self Care | Payer: Medicaid Other | Source: Ambulatory Visit | Attending: Family Medicine | Admitting: Family Medicine

## 2015-06-24 ENCOUNTER — Other Ambulatory Visit: Payer: Self-pay

## 2015-06-24 VITALS — BP 130/78 | HR 108 | Temp 99.1°F | Resp 18 | Ht 71.5 in | Wt 172.0 lb

## 2015-06-24 DIAGNOSIS — R1031 Right lower quadrant pain: Secondary | ICD-10-CM

## 2015-06-24 DIAGNOSIS — K3580 Unspecified acute appendicitis: Secondary | ICD-10-CM | POA: Insufficient documentation

## 2015-06-24 DIAGNOSIS — I10 Essential (primary) hypertension: Secondary | ICD-10-CM | POA: Diagnosis not present

## 2015-06-24 DIAGNOSIS — K353 Acute appendicitis with localized peritonitis, without perforation or gangrene: Secondary | ICD-10-CM

## 2015-06-24 DIAGNOSIS — R109 Unspecified abdominal pain: Secondary | ICD-10-CM | POA: Diagnosis present

## 2015-06-24 DIAGNOSIS — Z79899 Other long term (current) drug therapy: Secondary | ICD-10-CM | POA: Diagnosis not present

## 2015-06-24 HISTORY — PX: LAPAROSCOPIC APPENDECTOMY: SHX408

## 2015-06-24 LAB — CBC WITH DIFFERENTIAL/PLATELET
BASOS ABS: 0 10*3/uL (ref 0.0–0.1)
BASOS PCT: 0 %
Eosinophils Absolute: 0 10*3/uL (ref 0.0–0.7)
Eosinophils Relative: 0 %
HEMATOCRIT: 43.5 % (ref 39.0–52.0)
Hemoglobin: 14.7 g/dL (ref 13.0–17.0)
LYMPHS PCT: 21 %
Lymphs Abs: 1.9 10*3/uL (ref 0.7–4.0)
MCH: 30.6 pg (ref 26.0–34.0)
MCHC: 33.8 g/dL (ref 30.0–36.0)
MCV: 90.4 fL (ref 78.0–100.0)
MONO ABS: 0.6 10*3/uL (ref 0.1–1.0)
Monocytes Relative: 7 %
NEUTROS ABS: 6.3 10*3/uL (ref 1.7–7.7)
NEUTROS PCT: 72 %
Platelets: 214 10*3/uL (ref 150–400)
RBC: 4.81 MIL/uL (ref 4.22–5.81)
RDW: 12.3 % (ref 11.5–15.5)
WBC: 8.9 10*3/uL (ref 4.0–10.5)

## 2015-06-24 LAB — COMPREHENSIVE METABOLIC PANEL
ALBUMIN: 4.1 g/dL (ref 3.5–5.0)
ALT: 18 U/L (ref 17–63)
AST: 22 U/L (ref 15–41)
Alkaline Phosphatase: 77 U/L (ref 38–126)
Anion gap: 10 (ref 5–15)
BILIRUBIN TOTAL: 0.9 mg/dL (ref 0.3–1.2)
BUN: 10 mg/dL (ref 6–20)
CHLORIDE: 101 mmol/L (ref 101–111)
CO2: 25 mmol/L (ref 22–32)
CREATININE: 0.85 mg/dL (ref 0.61–1.24)
Calcium: 9.3 mg/dL (ref 8.9–10.3)
GFR calc Af Amer: >60 mL/min (ref >60)
GLUCOSE: 90 mg/dL (ref 65–99)
POTASSIUM: 3.7 mmol/L (ref 3.5–5.1)
Sodium: 136 mmol/L (ref 135–145)
TOTAL PROTEIN: 7.3 g/dL (ref 6.5–8.1)

## 2015-06-24 LAB — LIPASE, BLOOD: LIPASE: 22 U/L (ref 11–51)

## 2015-06-24 SURGERY — APPENDECTOMY, LAPAROSCOPIC
Anesthesia: General | Site: Abdomen

## 2015-06-24 MED ORDER — SODIUM CHLORIDE 0.9 % IR SOLN
Status: DC | PRN
  Administered 2015-06-24: 1

## 2015-06-24 MED ORDER — ACETAMINOPHEN 325 MG PO TABS: 325.0000 mg | ORAL_TABLET | ORAL | Status: DC | PRN

## 2015-06-24 MED ORDER — METRONIDAZOLE IN NACL 5-0.79 MG/ML-% IV SOLN
500.0000 mg | Freq: Three times a day (TID) | INTRAVENOUS | Status: DC
  Administered 2015-06-24 – 2015-06-25 (×2): 500 mg via INTRAVENOUS
  Filled 2015-06-24 (×2): qty 100

## 2015-06-24 MED ORDER — PROPOFOL 10 MG/ML IV BOLUS
INTRAVENOUS | Status: AC
  Filled 2015-06-24: qty 20

## 2015-06-24 MED ORDER — BUPIVACAINE-EPINEPHRINE (PF) 0.25% -1:200000 IJ SOLN
INTRAMUSCULAR | Status: AC
  Filled 2015-06-24: qty 30

## 2015-06-24 MED ORDER — SUCCINYLCHOLINE CHLORIDE 20 MG/ML IJ SOLN
INTRAMUSCULAR | Status: DC | PRN
  Administered 2015-06-24: 60 mg via INTRAVENOUS

## 2015-06-24 MED ORDER — OXYCODONE HCL 5 MG/5ML PO SOLN: 5.0000 mg | Freq: Once | ORAL | Status: AC | PRN

## 2015-06-24 MED ORDER — LIDOCAINE 2% (20 MG/ML) 5 ML SYRINGE
INTRAMUSCULAR | Status: AC
  Filled 2015-06-24: qty 5

## 2015-06-24 MED ORDER — 0.9 % SODIUM CHLORIDE (POUR BTL) OPTIME
TOPICAL | Status: DC | PRN
  Administered 2015-06-24: 1000 mL

## 2015-06-24 MED ORDER — SUGAMMADEX SODIUM 200 MG/2ML IV SOLN
INTRAVENOUS | Status: DC | PRN
  Administered 2015-06-24: 175 mg via INTRAVENOUS

## 2015-06-24 MED ORDER — FENTANYL CITRATE (PF) 250 MCG/5ML IJ SOLN
INTRAMUSCULAR | Status: AC
  Filled 2015-06-24: qty 5

## 2015-06-24 MED ORDER — OXYCODONE HCL 5 MG PO TABS
5.0000 mg | ORAL_TABLET | ORAL | Status: DC | PRN
  Administered 2015-06-25 (×2): 10 mg via ORAL
  Filled 2015-06-24 (×2): qty 2

## 2015-06-24 MED ORDER — OXYCODONE HCL 5 MG PO TABS
5.0000 mg | ORAL_TABLET | Freq: Once | ORAL | Status: AC | PRN
  Administered 2015-06-25: 5 mg via ORAL

## 2015-06-24 MED ORDER — LISINOPRIL 10 MG PO TABS: 10.0000 mg | ORAL_TABLET | Freq: Every day | ORAL | Status: DC

## 2015-06-24 MED ORDER — EPHEDRINE 5 MG/ML INJ
INTRAVENOUS | Status: AC
  Filled 2015-06-24: qty 10

## 2015-06-24 MED ORDER — DEXAMETHASONE SODIUM PHOSPHATE 10 MG/ML IJ SOLN
INTRAMUSCULAR | Status: DC | PRN
  Administered 2015-06-24: 5 mg via INTRAVENOUS

## 2015-06-24 MED ORDER — MIDAZOLAM HCL 5 MG/5ML IJ SOLN
INTRAMUSCULAR | Status: DC | PRN
  Administered 2015-06-24: 2 mg via INTRAVENOUS

## 2015-06-24 MED ORDER — LACTATED RINGERS IV SOLN
INTRAVENOUS | Status: DC | PRN
  Administered 2015-06-24: 23:00:00 via INTRAVENOUS

## 2015-06-24 MED ORDER — MIDAZOLAM HCL 2 MG/2ML IJ SOLN
INTRAMUSCULAR | Status: AC
  Filled 2015-06-24: qty 2

## 2015-06-24 MED ORDER — SODIUM CHLORIDE 0.9 % IV SOLN
INTRAVENOUS | Status: DC | PRN
  Administered 2015-06-24: 23:00:00 via INTRAVENOUS

## 2015-06-24 MED ORDER — ONDANSETRON 4 MG PO TBDP
4.0000 mg | ORAL_TABLET | Freq: Four times a day (QID) | ORAL | Status: DC | PRN
Start: 2015-06-24 — End: 2015-06-25
  Administered 2015-06-24: 4 mg via ORAL
  Filled 2015-06-24: qty 1

## 2015-06-24 MED ORDER — MORPHINE SULFATE (PF) 2 MG/ML IV SOLN
2.0000 mg | INTRAVENOUS | Status: DC | PRN
  Administered 2015-06-24 – 2015-06-25 (×2): 2 mg via INTRAVENOUS
  Filled 2015-06-24 (×2): qty 1

## 2015-06-24 MED ORDER — SUGAMMADEX SODIUM 500 MG/5ML IV SOLN
INTRAVENOUS | Status: AC
  Filled 2015-06-24: qty 5

## 2015-06-24 MED ORDER — DEXAMETHASONE SODIUM PHOSPHATE 10 MG/ML IJ SOLN
INTRAMUSCULAR | Status: AC
  Filled 2015-06-24: qty 1

## 2015-06-24 MED ORDER — ONDANSETRON HCL 4 MG/2ML IJ SOLN
INTRAMUSCULAR | Status: AC
  Filled 2015-06-24: qty 2

## 2015-06-24 MED ORDER — ONDANSETRON HCL 4 MG/2ML IJ SOLN
INTRAMUSCULAR | Status: DC | PRN
  Administered 2015-06-24: 4 mg via INTRAVENOUS

## 2015-06-24 MED ORDER — DEXTROSE 5 % IV SOLN
2.0000 g | INTRAVENOUS | Status: DC
  Administered 2015-06-24: 2 g via INTRAVENOUS
  Filled 2015-06-24 (×2): qty 2

## 2015-06-24 MED ORDER — FENTANYL CITRATE (PF) 100 MCG/2ML IJ SOLN
INTRAMUSCULAR | Status: DC | PRN
  Administered 2015-06-24: 150 ug via INTRAVENOUS
  Administered 2015-06-24: 50 ug via INTRAVENOUS

## 2015-06-24 MED ORDER — BUPIVACAINE-EPINEPHRINE 0.25% -1:200000 IJ SOLN
INTRAMUSCULAR | Status: DC | PRN
  Administered 2015-06-24: 20 mL

## 2015-06-24 MED ORDER — FENTANYL CITRATE (PF) 100 MCG/2ML IJ SOLN
25.0000 ug | INTRAMUSCULAR | Status: DC | PRN
  Administered 2015-06-25: 50 ug via INTRAVENOUS

## 2015-06-24 MED ORDER — SUGAMMADEX SODIUM 200 MG/2ML IV SOLN
INTRAVENOUS | Status: AC
  Filled 2015-06-24: qty 2

## 2015-06-24 MED ORDER — PROPOFOL 10 MG/ML IV BOLUS
INTRAVENOUS | Status: DC | PRN
  Administered 2015-06-24: 200 mg via INTRAVENOUS

## 2015-06-24 MED ORDER — ACETAMINOPHEN 160 MG/5ML PO SOLN
325.0000 mg | ORAL | Status: DC | PRN
  Filled 2015-06-24: qty 20.3

## 2015-06-24 MED ORDER — ROCURONIUM BROMIDE 50 MG/5ML IV SOLN
INTRAVENOUS | Status: AC
  Filled 2015-06-24: qty 1

## 2015-06-24 MED ORDER — MORPHINE SULFATE (PF) 4 MG/ML IV SOLN
6.0000 mg | Freq: Once | INTRAVENOUS | Status: AC
  Administered 2015-06-24: 6 mg via INTRAVENOUS
  Filled 2015-06-24: qty 2

## 2015-06-24 MED ORDER — PHENYLEPHRINE HCL 10 MG/ML IJ SOLN
INTRAMUSCULAR | Status: DC | PRN
  Administered 2015-06-24 (×2): 40 ug via INTRAVENOUS

## 2015-06-24 MED ORDER — ONDANSETRON HCL 4 MG/2ML IJ SOLN: 4.0000 mg | Freq: Four times a day (QID) | INTRAMUSCULAR | Status: DC | PRN

## 2015-06-24 MED ORDER — IOPAMIDOL (ISOVUE-300) INJECTION 61%
INTRAVENOUS | Status: AC
  Administered 2015-06-24: 100 mL
  Filled 2015-06-24: qty 100

## 2015-06-24 MED ORDER — ROCURONIUM BROMIDE 100 MG/10ML IV SOLN
INTRAVENOUS | Status: DC | PRN
  Administered 2015-06-24: 30 mg via INTRAVENOUS

## 2015-06-24 SURGICAL SUPPLY — 36 items
APPLIER CLIP 5 13 M/L LIGAMAX5 (MISCELLANEOUS)
CANISTER SUCTION 2500CC (MISCELLANEOUS) ×3 IMPLANT
CHLORAPREP W/TINT 26ML (MISCELLANEOUS) ×3 IMPLANT
CLIP APPLIE 5 13 M/L LIGAMAX5 (MISCELLANEOUS) IMPLANT
COVER SURGICAL LIGHT HANDLE (MISCELLANEOUS) ×3 IMPLANT
DERMABOND ADVANCED (GAUZE/BANDAGES/DRESSINGS) ×2
DERMABOND ADVANCED .7 DNX12 (GAUZE/BANDAGES/DRESSINGS) ×1 IMPLANT
ELECT REM PT RETURN 9FT ADLT (ELECTROSURGICAL) ×3
ELECTRODE REM PT RTRN 9FT ADLT (ELECTROSURGICAL) ×1 IMPLANT
ENDOLOOP SUT PDS II  0 18 (SUTURE) ×4
ENDOLOOP SUT PDS II 0 18 (SUTURE) ×2 IMPLANT
GLOVE BIO SURGEON STRL SZ7 (GLOVE) ×3 IMPLANT
GLOVE BIOGEL PI IND STRL 7.0 (GLOVE) ×1 IMPLANT
GLOVE BIOGEL PI INDICATOR 7.0 (GLOVE) ×2
GLOVE SURG SS PI 7.0 STRL IVOR (GLOVE) ×3 IMPLANT
GOWN STRL REUS W/ TWL LRG LVL3 (GOWN DISPOSABLE) ×3 IMPLANT
GOWN STRL REUS W/TWL LRG LVL3 (GOWN DISPOSABLE) ×6
KIT BASIN OR (CUSTOM PROCEDURE TRAY) ×3 IMPLANT
KIT ROOM TURNOVER OR (KITS) ×3 IMPLANT
LIQUID BAND (GAUZE/BANDAGES/DRESSINGS) ×3 IMPLANT
NS IRRIG 1000ML POUR BTL (IV SOLUTION) ×3 IMPLANT
PAD ARMBOARD 7.5X6 YLW CONV (MISCELLANEOUS) ×6 IMPLANT
POUCH RETRIEVAL ECOSAC 10 (ENDOMECHANICALS) ×1 IMPLANT
POUCH RETRIEVAL ECOSAC 10MM (ENDOMECHANICALS) ×2
SCISSORS LAP 5X35 DISP (ENDOMECHANICALS) ×3 IMPLANT
SET IRRIG TUBING LAPAROSCOPIC (IRRIGATION / IRRIGATOR) ×3 IMPLANT
SLEEVE ENDOPATH XCEL 5M (ENDOMECHANICALS) ×3 IMPLANT
SPECIMEN JAR SMALL (MISCELLANEOUS) ×3 IMPLANT
SUT MNCRL AB 4-0 PS2 18 (SUTURE) ×3 IMPLANT
TOWEL OR 17X24 6PK STRL BLUE (TOWEL DISPOSABLE) ×3 IMPLANT
TOWEL OR 17X26 10 PK STRL BLUE (TOWEL DISPOSABLE) ×3 IMPLANT
TRAY FOLEY CATH 16FR SILVER (SET/KITS/TRAYS/PACK) IMPLANT
TRAY LAPAROSCOPIC MC (CUSTOM PROCEDURE TRAY) ×3 IMPLANT
TROCAR XCEL NON-BLD 11X100MML (ENDOMECHANICALS) ×3 IMPLANT
TROCAR XCEL NON-BLD 5MMX100MML (ENDOMECHANICALS) ×3 IMPLANT
TUBING INSUFFLATION (TUBING) ×3 IMPLANT

## 2015-06-24 NOTE — ED Provider Notes (Signed)
CSN: [id number]     Arrival date & time 06/24/15  1858 History   First MD Initiated Contact with Patient 06/24/15 1924     Chief Complaint  Patient presents with  . Abdominal Pain    HPI Comments: Sent from clinic; had a CT which demonstrated appy wo perf/or abscess.  Appendix 12mm with stranding  Patient is a 30 y.o. male presenting with abdominal pain. The history is provided by the patient.  Abdominal Pain Pain location:  RLQ Pain quality: aching   Pain radiation: right side and to right back. Pain severity now: 8/10. Onset quality:  Gradual Duration:  3 days Timing:  Constant Progression:  Worsening Chronicity:  New Context: not alcohol use, not suspicious food intake and not trauma   Relieved by:  Nothing Associated symptoms: anorexia and chills   Associated symptoms: no chest pain, no constipation, no cough, no diarrhea, no dysuria, no fatigue, no fever, no hematuria, no nausea, no shortness of breath and no vomiting   Risk factors comment:  Hx of cholecystectomy     Past Medical History  Diagnosis Date  . Hypertension    Past Surgical History  Procedure Laterality Date  . Cholecystectomy  2013   Family History  Problem Relation Age of Onset  . Hypertension Mother    Social History  Substance Use Topics  . Smoking status: Never Smoker   . Smokeless tobacco: Never Used  . Alcohol Use: No    Review of Systems  Constitutional: Positive for chills. Negative for fever and fatigue.  Respiratory: Negative for cough, shortness of breath and wheezing.   Cardiovascular: Negative for chest pain.  Gastrointestinal: Positive for abdominal pain and anorexia. Negative for nausea, vomiting, diarrhea and constipation.  Genitourinary: Negative for dysuria, frequency, hematuria and flank pain.  Musculoskeletal: Negative for back pain.  Skin: Negative for rash.  All other systems reviewed and are negative.  Allergies  Review of patient's allergies indicates no known  allergies.  Home Medications   Prior to Admission medications   Medication Sig Start Date End Date Taking? Authorizing Provider  aspirin-acetaminophen-caffeine (EXCEDRIN MIGRAINE) [phone] MG tablet Take 2 tablets by mouth as needed for headache.    Historical Provider, MD  lisinopril (PRINIVIL,ZESTRIL) 10 MG tablet Take 1 tablet (10 mg total) by mouth daily. 06/24/15   Elvina SidleKurt Lauenstein, MD  nortriptyline (PAMELOR) 25 MG capsule Take 1 capsule at night for 1 week, then increase to 2 capsules at night 06/06/15   Van ClinesKaren M Aquino, MD   BP 108/68 mmHg  Pulse 100  Temp(Src) 98.3 F (36.8 C) (Oral)  Resp 16  Ht 5\' 10"  (1.778 m)  Wt 78.501 kg  BMI 24.83 kg/m2  SpO2 96% Physical Exam  Constitutional: He is oriented to person, place, and time. He appears well-developed and well-nourished. No distress.  Appears comfortable at rest  HENT:  Head: Normocephalic and atraumatic.  Nose: Nose normal.  Eyes: Conjunctivae are normal.  Neck: Normal range of motion. Neck supple. No tracheal deviation present.  Cardiovascular: Normal rate, regular rhythm and normal heart sounds.   No murmur heard. Pulmonary/Chest: Effort normal and breath sounds normal. No respiratory distress. He has no rales.  Abdominal: Soft. Bowel sounds are normal. He exhibits no distension and no mass. There is tenderness (RLQ). There is guarding.  +obturator, neg Rovsings, neg cvat  Musculoskeletal: Normal range of motion. He exhibits no edema.  Neurological: He is alert and oriented to person, place, and time.  Skin: Skin is warm and  dry. No rash noted.  Psychiatric: He has a normal mood and affect.  Nursing note and vitals reviewed.   ED Course  Procedures (including critical care time) Labs Review Labs Reviewed  CBC WITH DIFFERENTIAL/PLATELET  COMPREHENSIVE METABOLIC PANEL  LIPASE, BLOOD  URINALYSIS, ROUTINE W REFLEX MICROSCOPIC (NOT AT [facility])    Imaging Review Ct Abdomen Pelvis W Contrast  06/24/2015  CLINICAL  DATA:  Right mid to lower abdominal pain for the past 2 days. Poor appetite. EXAM: CT ABDOMEN AND PELVIS WITH CONTRAST TECHNIQUE: Multidetector CT imaging of the abdomen and pelvis was performed using the standard protocol following bolus administration of intravenous contrast. CONTRAST:  ISOVUE-300 IOPAMIDOL (ISOVUE-300) INJECTION 61% COMPARISON:  Abdomen and pelvis ultrasounds dated 12/21/2013. FINDINGS: Lower chest:  Minimal dependent atelectasis at the right lung base. Hepatobiliary: Small calcified granuloma in the right lobe of the liver. Surgically absent gallbladder with cholecystectomy clips. Pancreas: No mass, inflammatory changes, or other significant abnormality. Spleen: Within normal limits in size and appearance. Adrenals/Urinary Tract: Normal appearing adrenal glands, kidneys, ureters and urinary bladder. No urinary tract calculi or hydronephrosis. Stomach/Bowel: Diffusely enlarged appendix with extensive periappendiceal soft tissue stranding and adjacent lateral wall sacrum soft tissue thickening. No fluid collections are seen. The appendix measures 12 mm in diameter. No other gastrointestinal abnormalities. Normal appearing terminal ileum. Vascular/Lymphatic: Minimally enlarged right lower quadrant mesenteric lymph nodes. No atheromatous arterial calcifications are seen. Reproductive: No mass or other significant abnormality. Other: None. Musculoskeletal:  Unremarkable bones. IMPRESSION: Advanced acute appendicitis without abscess. These results will be called to the ordering clinician or representative by the Radiologist Assistant, and communication documented in the PACS or zVision Dashboard. Electronically Signed   By: Beckie Salts M.D.   On: 06/24/2015 17:54   I have personally reviewed and evaluated these images and lab results as part of my medical decision-making.   EKG Interpretation None      MDM   Final diagnoses:  Acute appendicitis with localized peritonitis    Ct  proven appy.  Surgery consulted. He is without systemic toxicity. Vitals stable.  No urinary sx.  Abx ordered per surgery.  Remained stable throughout my care.     Sofie Rower, MD 06/24/15 0865  Derwood Kaplan, MD 06/25/15 [phone]

## 2015-06-24 NOTE — Anesthesia Preprocedure Evaluation (Addendum)
Anesthesia Evaluation  Patient identified by MRN, date of birth, ID band Patient awake    Reviewed: Allergy & Precautions, NPO status , Patient's Chart, lab work & pertinent test results  History of Anesthesia Complications Negative for: history of anesthetic complications  Airway Mallampati: I  TM Distance: >3 FB Neck ROM: Full    Dental  (+) Teeth Intact, Dental Advisory Given   Pulmonary neg pulmonary ROS,    breath sounds clear to auscultation       Cardiovascular hypertension, Pt. on medications (-) angina(-) Past MI and (-) CHF  Rhythm:Regular     Neuro/Psych  Headaches, neg Seizures negative psych ROS   GI/Hepatic Neg liver ROS, Acute appendicitis   Endo/Other  negative endocrine ROS  Renal/GU negative Renal ROS     Musculoskeletal negative musculoskeletal ROS (+)   Abdominal   Peds  Hematology negative hematology ROS (+)   Anesthesia Other Findings   Reproductive/Obstetrics                         Anesthesia Physical Anesthesia Plan  ASA: II  Anesthesia Plan: General   Post-op Pain Management:    Induction: Intravenous, Rapid sequence and Cricoid pressure planned  Airway Management Planned: Oral ETT  Additional Equipment: None  Intra-op Plan:   Post-operative Plan: Extubation in OR  Informed Consent: I have reviewed the patients History and Physical, chart, labs and discussed the procedure including the risks, benefits and alternatives for the proposed anesthesia with the patient or authorized representative who has indicated his/her understanding and acceptance.   Dental advisory given  Plan Discussed with: CRNA and Surgeon  Anesthesia Plan Comments:        Anesthesia Quick Evaluation

## 2015-06-24 NOTE — Transfer of Care (Signed)
Immediate Anesthesia Transfer of Care Note  Patient: Garrett Miller  Procedure(s) Performed: Procedure(s): APPENDECTOMY LAPAROSCOPIC (N/A)  Patient Location: PACU  Anesthesia Type:General  Level of Consciousness: awake  Airway & Oxygen Therapy: Patient Spontanous Breathing and Patient connected to nasal cannula oxygen  Post-op Assessment: Report given to RN and Post -op Vital signs reviewed and stable  Post vital signs: Reviewed and stable  Last Vitals:  Filed Vitals:   06/24/15 2130 06/24/15 2350  BP: 108/68 115/60  Pulse: 100 112  Temp:  36.4 C  Resp:  16    Last Pain:  Filed Vitals:   06/24/15 2353  PainSc: Asleep         Complications: No apparent anesthesia complications

## 2015-06-24 NOTE — Progress Notes (Signed)
This is a 30 year old Hispanic man who speaks good [place]. He has been having right-sided abdominal pain for 24 hours and it is getting worse. Associated with a low-grade temperature. He has not had any nausea or vomiting. He is passing gas.  No dysuria or discomfort when passing his water.  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  Patient has had his gallbladder removed.  Objective:BP 130/78 mmHg  Pulse 108  Temp(Src) 99.1 F (37.3 C) (Oral)  Resp 18  Ht 5' 11.5" (1.816 m)  Wt 172 lb (78.019 kg)  BMI 23.66 kg/m2  SpO2 99% HEENT: No icterus, unremarkable to gross inspection Neck: Supple no adenopathy Chest: Clear to auscultation Heart: Regular, no murmur Abdomen: Active bowel sounds, some guarding on the right side with no rebound. No HSM or masses. Very tender in the mid right abdomen and right lower quadrant. Skin: No rash  Assessment: This patient has all the symptoms of acute appendicitis.  Plan: CT of the abdomen. If negative, consider constipation as a second possibility.  Signed, Sheila OatsKurt Kele Barthelemy M.D.

## 2015-06-24 NOTE — ED Notes (Signed)
Attempted to call report

## 2015-06-24 NOTE — Op Note (Signed)
Preoperative diagnosis: suppurative appendicitis  Postoperative diagnosis: Same   Procedure: laparoscopic appendectomy  Surgeon: Feliciana RossettiLuke Kinsinger, M.D.  Asst: none  Anesthesia: Gen.   Indications for procedure: Garrett Miller is a 30 y.o. male with symptoms of pain in right lower quadrant consistent with acute appendicitis. Confirmed by CT scan.  Description of procedure: The patient was brought into the operative suite, placed supine. Anesthesia was administered with endotracheal tube. The patient's left arm was tucked. All pressure points were offloaded by foam padding. The patient was prepped and draped in the usual sterile fashion.  5mm incision to the left of the umbilicus was made. Optical entry 5mm trocar was used to gain access to the peritoneal cavity. Pneumoperitoneum was applied with high flow low pressure.  1 5mm trocars was placed in the suprapubic space, one 11mm trocar in the LLQ. All trocars sites were first anesthesized with 0.25% marcaine with epinephrine. Next the patient was placed in trendelenberg, rotated to the left. The omentum was retracted cephalad. The cecum and appendix were identified. There were multiple adhesions to the right colon and right paracolic gutter. These were sharply dissected. The base of the appendix was dissected and a window through the mesoappendix was created with blunt dissection. Cautery was used to dissect the mesoappendix away and 2 0 PDS endoloops were placed at the base of the appendix. The appendix was then cut with scissors.  The appendix was placed in a specimen bag. The pelvis and RLQ were irrigated. No purulence was noted. The appendix was removed via the LLQ incision. 0 vicryl was used to close the fascial defect. Pneumoperitoneum was removed, all trocars were removed. All incisions were closed with 4-0 monocryl subcuticular stitch. The patient woke from anesthesia and was brought to PACU in stable condition.  Findings: suppurative  appendicitis  Specimen: appendix  Blood loss: <8450ml  Local anesthesia: 20ml 0.25% marcaine  Complications: none  Feliciana RossettiLuke Kinsinger, M.D. General, Bariatric, & Minimally Invasive Surgery [facility]

## 2015-06-24 NOTE — ED Notes (Signed)
Transported to OR in stable condition

## 2015-06-24 NOTE — H&P (Signed)
Garrett Miller is an 30 y.o. male.   Chief Complaint: abdominal pain HPI: 30 yo male with 2 days of abdominal pain. It started generalized and now is most in the right lower quadrant. He denies nausea or vomiting. He had a normal bowel movement this morning. Last meal was breakfast, no distinct anorexia. No fevers.  Past Medical History  Diagnosis Date  . Hypertension     Past Surgical History  Procedure Laterality Date  . Cholecystectomy  2013    Family History  Problem Relation Age of Onset  . Hypertension Mother    Social History:  reports that he has never smoked. He has never used smokeless tobacco. He reports that he does not drink alcohol or use illicit drugs.  Allergies: No Known Allergies   (Not in a hospital admission)  Results for orders placed or performed during the hospital encounter of 06/24/15 (from the past 48 hour(s))  CBC with Differential     Status: None   Collection Time: 06/24/15  8:15 PM  Result Value Ref Range   WBC 8.9 4.0 - 10.5 K/uL   RBC 4.81 4.22 - 5.81 MIL/uL   Hemoglobin 14.7 13.0 - 17.0 g/dL   HCT 16.1 09.6 - 04.5 %   MCV 90.4 78.0 - 100.0 fL   MCH 30.6 26.0 - 34.0 pg   MCHC 33.8 30.0 - 36.0 g/dL   RDW 40.9 81.1 - 91.4 %   Platelets 214 150 - 400 K/uL   Neutrophils Relative % 72 %   Neutro Abs 6.3 1.7 - 7.7 K/uL   Lymphocytes Relative 21 %   Lymphs Abs 1.9 0.7 - 4.0 K/uL   Monocytes Relative 7 %   Monocytes Absolute 0.6 0.1 - 1.0 K/uL   Eosinophils Relative 0 %   Eosinophils Absolute 0.0 0.0 - 0.7 K/uL   Basophils Relative 0 %   Basophils Absolute 0.0 0.0 - 0.1 K/uL   Ct Abdomen Pelvis W Contrast  06/24/2015  CLINICAL DATA:  Right mid to lower abdominal pain for the past 2 days. Poor appetite. EXAM: CT ABDOMEN AND PELVIS WITH CONTRAST TECHNIQUE: Multidetector CT imaging of the abdomen and pelvis was performed using the standard protocol following bolus administration of intravenous contrast. CONTRAST:  ISOVUE-300 IOPAMIDOL  (ISOVUE-300) INJECTION 61% COMPARISON:  Abdomen and pelvis ultrasounds dated 12/21/2013. FINDINGS: Lower chest:  Minimal dependent atelectasis at the right lung base. Hepatobiliary: Small calcified granuloma in the right lobe of the liver. Surgically absent gallbladder with cholecystectomy clips. Pancreas: No mass, inflammatory changes, or other significant abnormality. Spleen: Within normal limits in size and appearance. Adrenals/Urinary Tract: Normal appearing adrenal glands, kidneys, ureters and urinary bladder. No urinary tract calculi or hydronephrosis. Stomach/Bowel: Diffusely enlarged appendix with extensive periappendiceal soft tissue stranding and adjacent lateral wall sacrum soft tissue thickening. No fluid collections are seen. The appendix measures 12 mm in diameter. No other gastrointestinal abnormalities. Normal appearing terminal ileum. Vascular/Lymphatic: Minimally enlarged right lower quadrant mesenteric lymph nodes. No atheromatous arterial calcifications are seen. Reproductive: No mass or other significant abnormality. Other: None. Musculoskeletal:  Unremarkable bones. IMPRESSION: Advanced acute appendicitis without abscess. These results will be called to the ordering clinician or representative by the Radiologist Assistant, and communication documented in the PACS or zVision Dashboard. Electronically Signed   By: Garrett Miller M.D.   On: 06/24/2015 17:54    Review of Systems  Constitutional: Negative for fever and chills.  HENT: Negative for hearing loss.   Eyes: Negative for blurred vision and  double vision.  Respiratory: Negative for cough and hemoptysis.   Cardiovascular: Negative for chest pain and palpitations.  Gastrointestinal: Negative for nausea, vomiting and abdominal pain.  Genitourinary: Negative for dysuria and urgency.  Musculoskeletal: Negative for myalgias and neck pain.  Skin: Negative for itching and rash.  Neurological: Negative for dizziness, tingling and  headaches.  Endo/Heme/Allergies: Does not bruise/bleed easily.  Psychiatric/Behavioral: Negative for depression and suicidal ideas.    Blood pressure 119/62, pulse 95, temperature 98.3 F (36.8 C), temperature source Oral, resp. rate 16, height 5\' 10"  (1.778 m), weight 78.501 kg (173 lb 1 oz), SpO2 98 %. Physical Exam  Vitals reviewed. Constitutional: He is oriented to person, place, and time. He appears well-developed and well-nourished.  HENT:  Head: Normocephalic and atraumatic.  Eyes: Conjunctivae and EOM are normal. Pupils are equal, round, and reactive to light.  Neck: Normal range of motion. Neck supple.  Cardiovascular: Normal rate and regular rhythm.   Respiratory: Effort normal and breath sounds normal.  GI: Soft. Bowel sounds are normal. He exhibits no distension. There is tenderness in the right lower quadrant.  Positive iliopsoas sign  Musculoskeletal: Normal range of motion.  Neurological: He is alert and oriented to person, place, and time.  Skin: Skin is warm and dry.  Psychiatric: He has a normal mood and affect. His behavior is normal.     Assessment/Plan 30 yo male with symptoms, exam and ct supporting acute appendicitis -IV abx -or for lap appy  Rodman PickleLuke Aaron Kinsinger, MD 06/24/2015, 8:40 PM

## 2015-06-24 NOTE — Anesthesia Procedure Notes (Signed)
Procedure Name: Intubation Date/Time: 06/24/2015 10:42 PM Performed by: Julianne RiceBILOTTA, Therasa Lorenzi Z Pre-anesthesia Checklist: Patient identified, Timeout performed, Emergency Drugs available, Suction available and Patient being monitored Patient Re-evaluated:Patient Re-evaluated prior to inductionOxygen Delivery Method: Circle system utilized Preoxygenation: Pre-oxygenation with 100% oxygen Intubation Type: IV induction, Rapid sequence and Cricoid Pressure applied Laryngoscope Size: Mac and 4 Grade View: Grade I Tube type: Oral Tube size: 7.5 mm Number of attempts: 1 Airway Equipment and Method: Stylet Placement Confirmation: ETT inserted through vocal cords under direct vision,  breath sounds checked- equal and bilateral and positive ETCO2 Secured at: 23 cm Tube secured with: Tape Dental Injury: Teeth and Oropharynx as per pre-operative assessment

## 2015-06-24 NOTE — ED Notes (Signed)
Pt. signed consent form for his appendectomy.

## 2015-06-24 NOTE — ED Notes (Signed)
Pt here for appendicitis, pt had CT scan done which shows appendicitis. Pt c/o abdominal pain x2 days.

## 2015-06-24 NOTE — Telephone Encounter (Signed)
Pt. Was seen in the clinic today for a separate reason. Forgot to mention that he needed a RX refill of lisinopril 10mg . 6 month refill sent to Pt's pharmacy per Dr. Elbert EwingsL.

## 2015-06-24 NOTE — ED Notes (Signed)
Report given to Darlene RN at OR.

## 2015-06-24 NOTE — Patient Instructions (Addendum)
[facility] at 2pm for your scheduled CT exam Go to the main entrance, check in as an OUTPATIENT DO NOT EAT OR DRINK anything until after the exam is complete Once your exam is over please remain there until you get further instructions.       Apendicitis (Appendicitis) Se llama apendicitis a la inflamacin del apndice. La inflamacin puede causar la ruptura (perforacin) y la acumulacin de pus. (absceso).  CAUSAS No siempre hay una causa evidente de apendicitis. A veces se produce por una obstruccin en el apndice. Las causas de la obstruccin pueden ser:  Verner MouldUna bolita pequea y dura de materia fecal del tamao de una arveja (fecalito).  Ganglios linfticos agrandados en el apndice. SINTOMAS  Dolor alrededor del ombligo que se irradia hacia la zona derecha del abdomen. El dolor puede ser cada vez ms intenso y agudo a medida que el tiempo pasa.  Sensibilidad en la zona inferior derecha del abdomen. El dolor empeora si tose o realiza algn movimiento brusco.  Ganas de vomitar (nuseas).  Vmitos.  Prdida del apetito.  Grant RutsFiebre.  Constipacin.  Diarrea.  Malestar general. DIAGNSTICO  Examen fsico.  Anlisis de sangre.  Anlisis de [place].  Una radiografa o tomografa computada para confirmar el diagnstico. TRATAMIENTO Una vez que el diagnstico de apendicitis se ha realizado, se procede a extirparlo lo antes posible. Este procedimiento se denomina apendicectoma. En una apendicectoma abierta se realiza un corte (incisin) en la zona inferior derecha del abdomen y se extirpa el apndice. En una apendicectoma laparoscpica, se realizan 3 incisiones pequeas. Para extirpar el apndice, se utiliza un instrumento largo y delgado y Posey Boyeruna cmara. La mayora de los pacientes vuelve a su casa en 24 a 48 horas despus de la [place].  En algunas situaciones, el apndice puede perforarse nuevamente y formarse un absceso. El absceso puede tener una  "pared" alrededor y observarse en una tomografa computada. En este caso, le colocarn un drenaje en el absceso para que drene el lquido y podr recibir un tratamiento con antibiticos para Halliburton Companyeliminar los grmenes. Estos medicamentos se administran a travs de un tubo en la vena (IV). Una vez que el absceso se ha eliminado, podr o no ser Washington Mutualnecesaria la apendicectoma. Puede ser necesario que permanezca en el hospital durante ms de 48 horas.    Esta informacin no tiene Theme park managercomo fin reemplazar el consejo del mdico. Asegrese de hacerle al mdico cualquier pregunta que tenga.   Document Released: 02/01/2005 Document Revised: 05/29/2012 Elsevier Interactive Patient Education [employer]2016 Elsevier Inc.     IF you received an x-ray today, you will receive an invoice from [facility]. Please contact [facility] at [phone] with questions or concerns regarding your invoice.   IF you received labwork today, you will receive an invoice from [facility]. Please contact [facility] at [phone] with questions or concerns regarding your invoice.   Our billing staff will not be able to assist you with questions regarding bills from these companies.  You will be contacted with the lab results as soon as they are available. The fastest way to get your results is to activate your My Chart account. Instructions are located on the last page of this paperwork. If you have not heard from us regarding the results in 2 weeks, please contact this office.

## 2015-06-25 ENCOUNTER — Encounter (HOSPITAL_COMMUNITY): Payer: Self-pay | Admitting: General Surgery

## 2015-06-25 LAB — URINALYSIS, ROUTINE W REFLEX MICROSCOPIC
Bilirubin Urine: NEGATIVE
GLUCOSE, UA: NEGATIVE mg/dL
KETONES UR: NEGATIVE mg/dL
LEUKOCYTES UA: NEGATIVE
Nitrite: NEGATIVE
PH: 7 (ref 5.0–8.0)
Protein, ur: NEGATIVE mg/dL
SPECIFIC GRAVITY, URINE: 1.01 (ref 1.005–1.030)

## 2015-06-25 LAB — URINE MICROSCOPIC-ADD ON
Bacteria, UA: NONE SEEN
WBC UA: NONE SEEN WBC/hpf (ref 0–5)

## 2015-06-25 MED ORDER — LISINOPRIL 10 MG PO TABS
10.0000 mg | ORAL_TABLET | Freq: Every day | ORAL | Status: DC
  Administered 2015-06-25: 10 mg via ORAL
  Filled 2015-06-25: qty 1

## 2015-06-25 MED ORDER — OXYCODONE HCL 5 MG PO TABS: 5.0000 mg | ORAL_TABLET | Freq: Four times a day (QID) | ORAL | Status: DC | PRN

## 2015-06-25 MED ORDER — OXYCODONE HCL 5 MG PO TABS
ORAL_TABLET | ORAL | Status: AC
  Filled 2015-06-25: qty 1

## 2015-06-25 MED ORDER — NORTRIPTYLINE HCL 25 MG PO CAPS
25.0000 mg | ORAL_CAPSULE | Freq: Two times a day (BID) | ORAL | Status: DC
  Administered 2015-06-25: 25 mg via ORAL
  Filled 2015-06-25 (×2): qty 1

## 2015-06-25 MED ORDER — FENTANYL CITRATE (PF) 100 MCG/2ML IJ SOLN
INTRAMUSCULAR | Status: AC
  Filled 2015-06-25: qty 2

## 2015-06-25 NOTE — Progress Notes (Signed)
Pt states his pain is a 9 complaints of testicular pain.  Pt also falling asleep and waking appearing disoriented and RR 6-8 and needing to be prompted to take deep breaths.  No further pain medication administered at this time.  Will continue to monitor.

## 2015-06-25 NOTE — Progress Notes (Signed)
People ambulating in hall without assistance. Tolerating well.

## 2015-06-25 NOTE — Progress Notes (Signed)
Pt provided with discharge instruction including information on follow up appointments and new medications. Pt verbalized understanding of all information. Pt IV was dc'd without complication. VSS. Pt escorted out via wheelchair by hospital volunteer.

## 2015-06-25 NOTE — Progress Notes (Signed)
New Admission Note:  Arrival Method: via bed with PACU RN Mental Orientation: Alert and oriented x4 Telemetry: n/a Assessment: Completed Skin: surgical incision on the abdomen (see doc flow sheet) IV: Right AC Pain: see MAR Tubes: N/A Safety Measures: Safety Fall Prevention Plan discussed. Admission: initiated [facility] Orientation: Patient has been orientated to the room, unit and the staff. Family: at bedside  Orders have been reviewed and implemented. Will continue to monitor the patient. Call light has been placed within reach and bed alarm has been activated.   Tempie DonningMercy Greidys Deland BSN, RN  Phone Number: [phone] [facility]

## 2015-06-25 NOTE — Discharge Summary (Signed)
[facility] Discharge Summary   Patient ID: Garrett Miller MRN: [id number] DOB/AGE: 32-Dec-1987 32 y.o.  Admit date: 06/24/2015 Discharge date: 06/25/2015  Admitting Diagnosis: Acute appendicitis  Discharge Diagnosis Patient Active Problem List   Diagnosis Date Noted  . Acute appendicitis 06/24/2015  . Pulsatile tinnitus of right ear 06/06/2015  . Worsening headaches 02/10/2015  . Essential hypertension 02/10/2015  . Chest pain 02/22/2014  . HTN (hypertension) 08/22/2013    Consultants None  Imaging: Ct Abdomen Pelvis W Contrast  06/24/2015  CLINICAL DATA:  Right mid to lower abdominal pain for the past 2 days. Poor appetite. EXAM: CT ABDOMEN AND PELVIS WITH CONTRAST TECHNIQUE: Multidetector CT imaging of the abdomen and pelvis was performed using the standard protocol following bolus administration of intravenous contrast. CONTRAST:  ISOVUE-300 IOPAMIDOL (ISOVUE-300) INJECTION 61% COMPARISON:  Abdomen and pelvis ultrasounds dated 12/21/2013. FINDINGS: Lower chest:  Minimal dependent atelectasis at the right lung base. Hepatobiliary: Small calcified granuloma in the right lobe of the liver. Surgically absent gallbladder with cholecystectomy clips. Pancreas: No mass, inflammatory changes, or other significant abnormality. Spleen: Within normal limits in size and appearance. Adrenals/Urinary Tract: Normal appearing adrenal glands, kidneys, ureters and urinary bladder. No urinary tract calculi or hydronephrosis. Stomach/Bowel: Diffusely enlarged appendix with extensive periappendiceal soft tissue stranding and adjacent lateral wall sacrum soft tissue thickening. No fluid collections are seen. The appendix measures 12 mm in diameter. No other gastrointestinal abnormalities. Normal appearing terminal ileum. Vascular/Lymphatic: Minimally enlarged right lower quadrant mesenteric lymph nodes. No atheromatous arterial calcifications are seen. Reproductive: No mass or other  significant abnormality. Other: None. Musculoskeletal:  Unremarkable bones. IMPRESSION: Advanced acute appendicitis without abscess. These results will be called to the ordering clinician or representative by the Radiologist Assistant, and communication documented in the PACS or zVision Dashboard. Electronically Signed   By: Beckie Salts M.D.   On: 06/24/2015 17:54    Procedures Dr. Sheliah Hatch (06/24/15) - Laparoscopic Appendectomy  Hospital Course:  30 y/o male with 2 days of abdominal pain. It started generalized and now is most in the right lower quadrant. He denies nausea or vomiting. He had a normal bowel movement this morning. Last meal was breakfast, no distinct anorexia. No fevers.  Workup showed acute appendicitis.  Patient was admitted and underwent procedure listed above.  Tolerated procedure well and was transferred to the floor.  Diet was advanced as tolerated.  On POD #1, the patient was voiding well, tolerating diet, ambulating well, pain well controlled, vital signs stable, incisions c/d/i and felt stable for discharge home.  Patient will follow up in our office in 2 weeks and knows to call with questions or concerns.    Physical Exam: General:  Alert, NAD, pleasant, comfortable Abd:  Soft, ND, mild tenderness, incisions C/D/I    Medication List    TAKE these medications        aspirin-acetaminophen-caffeine 250-250-65 MG tablet  Commonly known as:  EXCEDRIN MIGRAINE  Take 2 tablets by mouth as needed for headache.     lisinopril 10 MG tablet  Commonly known as:  PRINIVIL,ZESTRIL  Take 1 tablet (10 mg total) by mouth daily.     nortriptyline 25 MG capsule  Commonly known as:  PAMELOR  Take 1 capsule at night for 1 week, then increase to 2 capsules at night     oxyCODONE 5 MG immediate release tablet  Commonly known as:  Oxy IR/ROXICODONE  Take 1-2 tablets (5-10 mg total) by mouth every 6 (six) hours as needed  for moderate pain or severe pain.         Follow-up  Information    [facility] on 07/[facility]/2015.   Specialty:  General Surgery   Why:  For post-operation check. Your appointment is at 12:00pm, please arrive at least 30min before your appointment to complete your check in paperwork.  If you are unable to arrive 30min prior to your appointment time we may have to cancel or reschedule you.   Contact information:   [address] [place] [id number] [phone]      Signed: Bradd CanaryMegan N. Nik Gorrell, PA-C [facility] [phone]  06/25/2015, 11:04 AM

## 2015-06-25 NOTE — Discharge Instructions (Signed)
Your appointment is at 12:00pm, please arrive at least 30min before your appointment to complete your check in paperwork.  If you are unable to arrive 30min prior to your appointment time we may have to cancel or reschedule you.  LAPAROSCOPIC SURGERY: POST OP INSTRUCTIONS  1. DIET: Follow a light bland diet the first 24 hours after arrival home, such as soup, liquids, crackers, etc. Be sure to include lots of fluids daily. Avoid fast food or heavy meals as your are more likely to get nauseated. Eat a low fat the next few days after surgery.  2. Take your usually prescribed home medications unless otherwise directed. 3. PAIN CONTROL:  1. Pain is best controlled by a usual combination of three different methods TOGETHER:  1. Ice/Heat 2. Over the counter pain medication 3. Prescription pain medication 2. Most patients will experience some swelling and bruising around the incisions. Ice packs or heating pads (30-60 minutes up to 6 times a day) will help. Use ice for the first few days to help decrease swelling and bruising, then switch to heat to help relax tight/sore spots and speed recovery. Some people prefer to use ice alone, heat alone, alternating between ice & heat. Experiment to what works for you. Swelling and bruising can take several weeks to resolve.  3. It is helpful to take an over-the-counter pain medication regularly for the first few weeks. Choose one of the following that works best for you:  1. Naproxen (Aleve, etc) Two 220mg  tabs twice a day 2. Ibuprofen (Advil, etc) Three 200mg  tabs four times a day (every meal & bedtime) 3. Acetaminophen (Tylenol, etc) 500-650mg  four times a day (every meal & bedtime) 4. A prescription for pain medication (such as oxycodone, hydrocodone, etc) should be given to you upon discharge. Take your pain medication as prescribed.  1. If you are having problems/concerns with the prescription medicine (does not control pain, nausea, vomiting, rash, itching,  etc), please call us [phone] to see if we need to switch you to a different pain medicine that will work better for you and/or control your side effect better. 2. If you need a refill on your pain medication, please contact your pharmacy. They will contact our office to request authorization. Prescriptions will not be filled after 5 pm or on week-ends. 4. Avoid getting constipated. Between the surgery and the pain medications, it is common to experience some constipation. Increasing fluid intake and taking a fiber supplement (such as Metamucil, Citrucel, FiberCon, MiraLax, etc) 1-2 times a day regularly will usually help prevent this problem from occurring. A mild laxative (prune juice, Milk of Magnesia, MiraLax, etc) should be taken according to package directions if there are no bowel movements after 48 hours.  5. Watch out for diarrhea. If you have many loose bowel movements, simplify your diet to bland foods & liquids for a few days. Stop any stool softeners and decrease your fiber supplement. Switching to mild anti-diarrheal medications (Kayopectate, Pepto Bismol) can help. If this worsens or does not improve, please call us. 6. Wash / shower every day. You may shower over the dressings as they are waterproof. Continue to shower over incision(s) after the dressing is off. 7. Remove your waterproof bandages 5 days after surgery. You may leave the incision open to air. You may replace a dressing/Band-Aid to cover the incision for comfort if you wish.  8. ACTIVITIES as tolerated:  1. You may resume regular (light) daily activities beginning the next day--such as daily self-care,  walking, climbing stairs--gradually increasing activities as tolerated. If you can walk 30 minutes without difficulty, it is safe to try more intense activity such as jogging, treadmill, bicycling, low-impact aerobics, swimming, etc. °2. Save the most intensive and strenuous activity for last such as sit-ups, heavy lifting,  contact sports, etc Refrain from any heavy lifting or straining until you are off narcotics for pain control.  °3. DO NOT PUSH THROUGH PAIN. Let pain be your guide: If it hurts to do something, don't do it. Pain is your body warning you to avoid that activity for another week until the pain goes down. °4. You may drive when you are no longer taking prescription pain medication, you can comfortably wear a seatbelt, and you can safely maneuver your car and apply brakes. °5. You may have sexual intercourse when it is comfortable.  °9. FOLLOW UP in our office  °1. Please call [facility] at [phone] to set up an appointment to see your surgeon in the office for a follow-up appointment approximately 2-3 weeks after your surgery. °2. Make sure that you call for this appointment the day you arrive home to insure a convenient appointment time. °     10. IF YOU HAVE DISABILITY OR FAMILY LEAVE FORMS, BRING THEM TO THE               OFFICE FOR PROCESSING.  ° °WHEN TO CALL US [phone]:  °1. Poor pain control °2. Reactions / problems with new medications (rash/itching, nausea, etc)  °3. Fever over 101.5 F (38.5 C) °4. Inability to urinate °5. Nausea and/or vomiting °6. Worsening swelling or bruising °7. Continued bleeding from incision. °8. Increased pain, redness, or drainage from the incision ° °The clinic staff is available to answer your questions during regular business hours (8:30am-5pm). Please don’t hesitate to call and ask to speak to one of our nurses for clinical concerns.  °If you have a medical emergency, go to the nearest emergency room or call 911.  °A surgeon from [facility] is always on call at the hospitals  ° °[facility]  °[address], [address], [place], [place] [address] ?  °MAIN: [phone] ? TOLL FREE: [phone] ?  °FAX [phone]  °[email or web address] ° °

## 2015-06-26 NOTE — Anesthesia Postprocedure Evaluation (Signed)
Anesthesia Post Note  Patient: Garrett Miller  Procedure(s) Performed: Procedure(s) (LRB): APPENDECTOMY LAPAROSCOPIC (N/A)  Patient location during evaluation: PACU Anesthesia Type: General Level of consciousness: awake Pain management: pain level controlled Vital Signs Assessment: post-procedure vital signs reviewed and stable Respiratory status: spontaneous breathing Cardiovascular status: stable Postop Assessment: no signs of nausea or vomiting Anesthetic complications: no    Last Vitals:  Filed Vitals:   06/25/15 0549 06/25/15 0844  BP: 112/69 109/55  Pulse: 90 80  Temp: 36.7 C 36.7 C  Resp: 18 18    Last Pain:  Filed Vitals:   06/25/15 1508  PainSc: 8                  Edison Nicholson

## 2015-07-09 ENCOUNTER — Other Ambulatory Visit: Payer: Self-pay | Admitting: Family Medicine

## 2015-08-01 ENCOUNTER — Ambulatory Visit (HOSPITAL_COMMUNITY)
Admission: EM | Admit: 2015-08-01 | Discharge: 2015-08-01 | Disposition: A | Discharge status: Home / Self Care | Payer: Medicaid Other | Attending: Family Medicine | Admitting: Family Medicine

## 2015-08-01 ENCOUNTER — Encounter (HOSPITAL_COMMUNITY): Payer: Self-pay | Admitting: Emergency Medicine

## 2015-08-01 DIAGNOSIS — K2100 Gastro-esophageal reflux disease with esophagitis, without bleeding: Secondary | ICD-10-CM

## 2015-08-01 MED ORDER — ESOMEPRAZOLE MAGNESIUM 40 MG PO CPDR: 40.0000 mg | DELAYED_RELEASE_CAPSULE | Freq: Two times a day (BID) | ORAL | Status: DC

## 2015-08-01 MED ORDER — RANITIDINE HCL 300 MG PO TABS: 300.0000 mg | ORAL_TABLET | Freq: Two times a day (BID) | ORAL | Status: DC

## 2015-08-01 NOTE — ED Provider Notes (Signed)
CSN: [id number]     Arrival date & time 08/01/15  1303 History   None    Chief Complaint  Patient presents with  . Gastroesophageal Reflux   (Consider location/radiation/quality/duration/timing/severity/associated sxs/prior Treatment) Patient is a 30 y.o. male presenting with GERD. The history is provided by the patient. The history is limited by a language barrier.  Gastroesophageal Reflux This is a new problem. The current episode started more than 1 week ago. The problem occurs constantly. The problem has not changed since onset.Associated symptoms include abdominal pain. The symptoms are aggravated by eating. Nothing relieves the symptoms. Treatments tried: nexium and zantac daily.    Past Medical History  Diagnosis Date  . Hypertension    Past Surgical History  Procedure Laterality Date  . Cholecystectomy  2013  . Laparoscopic appendectomy N/A 06/24/2015    Procedure: APPENDECTOMY LAPAROSCOPIC;  Surgeon: De Blanch Kinsinger, MD;  Location: [facility] OR;  Service: General;  Laterality: N/A;   Family History  Problem Relation Age of Onset  . Hypertension Mother    Social History  Substance Use Topics  . Smoking status: Never Smoker   . Smokeless tobacco: Never Used  . Alcohol Use: No    Review of Systems  Constitutional: Negative.   HENT: Negative.   Eyes: Negative.   Respiratory: Negative.   Cardiovascular: Negative.   Gastrointestinal: Positive for abdominal pain.  Endocrine: Negative.   Genitourinary: Negative.   Musculoskeletal: Negative.   Skin: Negative.   Allergic/Immunologic: Negative.   Neurological: Negative.   Hematological: Negative.   Psychiatric/Behavioral: Negative.     Allergies  Review of patient's allergies indicates no known allergies.  Home Medications   Prior to Admission medications   Medication Sig Start Date End Date Taking? Authorizing Provider  lisinopril (PRINIVIL,ZESTRIL) 10 MG tablet Take 1 tablet (10 mg total) by mouth daily. 06/24/15   Yes Elvina Sidle, MD  esomeprazole (NEXIUM) 40 MG capsule Take 1 capsule (40 mg total) by mouth 2 (two) times daily before a meal. 08/01/15   Deatra Canter, FNP  nortriptyline (PAMELOR) 25 MG capsule Take 1 capsule at night for 1 week, then increase to 2 capsules at night Patient taking differently: Take 50 mg by mouth at bedtime.  06/06/15   Van Clines, MD  oxyCODONE (OXY IR/ROXICODONE) 5 MG immediate release tablet Take 1-2 tablets (5-10 mg total) by mouth every 6 (six) hours as needed for moderate pain or severe pain. 06/25/15   Nonie Hoyer, PA-C  ranitidine (ZANTAC) 300 MG tablet Take 1 tablet (300 mg total) by mouth 2 (two) times daily. 08/01/15   Deatra Canter, FNP   Meds Ordered and Administered this Visit  Medications - No data to display  BP 125/73 mmHg  Pulse 70  Temp(Src) 97.5 F (36.4 C) (Oral)  Resp 16  SpO2 100% No data found.   Physical Exam  Constitutional: He appears well-developed and well-nourished.  HENT:  Head: Normocephalic.  Right Ear: External ear normal.  Left Ear: External ear normal.  Mouth/Throat: Oropharynx is clear and moist.  Eyes: Conjunctivae are normal. Pupils are equal, round, and reactive to light.  Cardiovascular: Normal rate, regular rhythm and normal heart sounds.   Pulmonary/Chest: Effort normal and breath sounds normal.  Abdominal: Soft. Bowel sounds are normal.    ED Course  Procedures (including critical care time)  Labs Review Labs Reviewed - No data to display  Imaging Review No results found.   Visual Acuity Review  Right Eye Distance:  Left Eye Distance:   Bilateral Distance:    Right Eye Near:   Left Eye Near:    Bilateral Near:         MDM   1. Gastroesophageal reflux disease with esophagitis    Nexium 40mg  po bid x 2 weeks #28, Zantac 300mg  one po bid #28.  Need to get in with PCP and may need referral if having breakthrough GERD sx's.  Discussed he may need EGD.    Deatra CanterWilliam J Yates Weisgerber,  FNP 08/01/15 1400

## 2015-08-01 NOTE — Discharge Instructions (Signed)
Esofagitis °(Esophagitis) °La esofagitis es la inflamación del esófago, el tubo que transporta los alimentos y los líquidos desde la boca hasta el estómago. La esofagitis puede causar dolor en el esófago. Esta afección puede dificultar la deglución y producir dolor al tragar.  °CAUSAS °La mayoría de las causas de la esofagitis no son graves. Las causas más frecuentes de esta afección incluyen las siguientes: °· Enfermedad por reflujo gastroesofágico (ERGE). Ocurre cuando el contenido estomacal regresa al esófago (reflujo). °· Vómitos reiterados. °· Una reacción de tipo alérgico, especialmente causada por alergias alimentarias (esofagitis eosinofílica). °· Lesiones esofágicas al ingerir comprimidos de gran tamaño con o sin agua, o algunos tipos de medicamentos. °· La ingestión de sustancias químicas dañinas, como productos de limpieza doméstica. °· El consumo excesivo de alcohol. °· Una infección en el esófago, más frecuente en las personas cuyo sistema inmunitario está debilitado. °· Tratamiento contra el cáncer, como la radioterapia o la quimioterapia. °· Algunas enfermedades, como la sarcoidosis, la enfermedad de Crohn y la esclerodermia. °SÍNTOMAS °Los síntomas de esta afección incluyen lo siguiente: °· Dificultad o dolor al tragar. °· Dolor al tragar líquidos ácidos, como los jugos de cítricos. °· Dolor al eructar. °· Dolor en el pecho. °· Dificultad para respirar. °· Náuseas. °· Vómitos. °· Dolor en el abdomen. °· Pérdida de peso. °· Llagas en la boca. °· Manchas de una sustancia blanca en la boca (candidiasis). °· Fiebre. °· Vómitos o tos con expectoración de sangre. °· Heces negras, alquitranadas o de color rojo brillante. °DIAGNÓSTICO °El médico le hará una historia clínica y un examen físico. También pueden hacerle otros estudios, por ejemplo: °· Una endoscopia para examinar el estómago y el esófago con una pequeña cámara. °· Un estudio que determina el nivel de acidez en el esófago. °· Un estudio que mide  la presión que hay en el esófago. °· Un estudio de deglución de bario o un estudio modificado de deglución de bario para mostrar la forma, el tamaño y el funcionamiento del esófago. °· Pruebas de alergia. °TRATAMIENTO °El tratamiento depende de la causa de la esofagitis. En algunos casos, se pueden administrar corticoides y otros medicamentos para ayudar a aliviar los síntomas o tratar la causa preexistente de la afección. Es posible que sea necesario hacer algunos cambios en el estilo de vida, por ejemplo: °· Evitar el alcohol. °· Dejar de fumar. °· Cambiar la dieta. °· Hacer actividad física. °· Modificar los hábitos de sueño y el entorno donde se duerme. °INSTRUCCIONES PARA EL CUIDADO EN EL HOGAR °Tome estas medidas para aliviar las molestias y evitar las complicaciones. °Dieta °· Siga la dieta que le haya recomendado el médico, la cual puede incluir evitar alimentos y bebidas tales como: °¨ Café y té (con o sin cafeína). °¨ Bebidas que contengan alcohol. °¨ Bebidas energizantes y deportivas. °¨ Gaseosas o refrescos. °¨ Chocolate y cacao. °¨ Menta y esencias de menta. °¨ Ajo y cebollas. °¨ Rábano picante. °¨ Alimentos muy condimentados y ácidos, entre ellos, pimientos, chile en polvo, curry en polvo, vinagre, salsas picantes y salsa barbacoa. °¨ Frutas cítricas y sus jugos, como naranjas, limones y limas. °¨ Alimentos a base de tomates, como salsa roja, chile, salsa y pizza con salsa roja. °¨ Alimentos fritos y grasos, como rosquillas, papas fritas y aderezos con alto contenido de grasa. °¨ Carnes con alto contenido de grasa, como hot dogs y cortes grasos de carnes rojas y blancas, por ejemplo, filetes de entrecot, salchicha, jamón y tocino. °¨ Productos lácteos con alto contenido de grasa,   como leche Pearl Riverentera, Womelsdorfmantequilla y queso crema.  Haga comidas pequeas y frecuentes Freight forwarderdurante el da en lugar de comidas abundantes.  Evite beber mucho lquido con las comidas.  No coma durante las 2 o 3horas previas a la  hora de Moncureacostarse.  No se acueste inmediatamente despus de comer.  No haga actividad fsica enseguida despus de comer.  Evite las comidas y las bebidas que parecen FedExempeorar los sntomas. Instrucciones generales  Est atento a cualquier cambio en los sntomas.  Tome los medicamentos de venta libre y los recetados solamente como se lo haya indicado el mdico. No tome aspirina, ibuprofeno ni otros antiinflamatorios no esteroides (AINE), a menos que se lo haya indicado el mdico.  Si tiene dificultar para tomar comprimidos, parta los comprimidos para reducir Brewing technologistsu tamao. Esto reducir la probabilidad de que el comprimido se atasque o lastime el esfago al pasar por all. Adems, beba agua despus de tomar un comprimido.  No consuma ningn producto que contenga tabaco, lo que incluye cigarrillos, tabaco de Theatre managermascar y Administrator, Civil Servicecigarrillos electrnicos. Si necesita ayuda para dejar de fumar, consulte al mdico.  Use ropas sueltas. No use prendas ajustadas alrededor de la cintura que ejerzan presin en el abdomen.  Levante (eleve) unas 6pulgadas (15centmetros) la cabecera de la cama.  Trate de reducir J. C. Penneyel nivel de estrs con actividades como el yoga o la meditacin. Si necesita ayuda para reducir J. C. Penneyel nivel de estrs, consulte al mdico.  Si tiene sobrepeso, Media planneradelgace hasta alcanzar un peso saludable. Hable con el mdico acerca de su peso ideal y pdale asesoramiento en cuanto a la dieta que debe seguir para Aeronautical engineerpoder alcanzarlo.  Concurra a todas las visitas de control como se lo haya indicado el mdico. Esto es importante. SOLICITE ATENCIN MDICA SI:  Aparecen nuevos sntomas.  Baja de peso sin causa aparente.  Tiene dificultad para tragar o siente dolor al Darden Restaurantshacerlo.  Tiene sibilancias o tos persistente.  Los sntomas no mejoran con Scientist, research (medical)el tratamiento.  Tiene acidez frecuente durante ms de Marsh & McLennandos semanas. SOLICITE ATENCIN MDICA DE INMEDIATO SI:  Tiene dolor intenso en los brazos, el cuello, los  Panthersvillemaxilares, la dentadura o la espalda.  Berenice Primasranspira, se marea o tiene sensacin de desvanecimiento.  Siente falta de aire o Journalist, newspaperdolor en el pecho.  Vomita y el vmito es parecido a la sangre o a los granos de caf.  Las heces son sanguinolentas o de color negro.  Tiene fiebre.  No puede tragar, beber o comer.   Esta informacin no tiene Theme park managercomo fin reemplazar el consejo del mdico. Asegrese de hacerle al mdico cualquier pregunta que tenga.   Document Released: 02/01/2005 Document Revised: 10/23/2014 Elsevier Interactive Patient Education [employer]2016 Elsevier Inc.

## 2015-08-01 NOTE — ED Notes (Signed)
C/o acid reflux... Reports sx flared up after appendectomy he had last month... Taking omeprazole and ranitidine.... A&O x4... No acute distress.

## 2015-10-10 ENCOUNTER — Encounter: Payer: Self-pay | Admitting: Neurology

## 2015-10-10 ENCOUNTER — Ambulatory Visit (INDEPENDENT_AMBULATORY_CARE_PROVIDER_SITE_OTHER): Payer: Medicaid Other | Admitting: Neurology

## 2015-10-10 VITALS — BP 150/72 | HR 73 | Temp 98.4°F | Ht 70.0 in | Wt 171.1 lb

## 2015-10-10 DIAGNOSIS — R519 Headache, unspecified: Secondary | ICD-10-CM

## 2015-10-10 DIAGNOSIS — H93A1 Pulsatile tinnitus, right ear: Secondary | ICD-10-CM | POA: Diagnosis not present

## 2015-10-10 DIAGNOSIS — R51 Headache: Secondary | ICD-10-CM | POA: Diagnosis not present

## 2015-10-10 DIAGNOSIS — I10 Essential (primary) hypertension: Secondary | ICD-10-CM

## 2015-10-10 NOTE — Patient Instructions (Signed)
1. Schedule MRI brain with and without contrast 2. Schedule MRA head without contrast 3. Continue current medications 4. Minimize rescue pain medication to 2-3 a week to avoid rebound headaches 5. See a primary care doctor for your blood pressure 6. Follow-up in 2 months, call for any changes

## 2015-10-10 NOTE — Progress Notes (Signed)
NEUROLOGY FOLLOW UP OFFICE NOTE  Garrett Miller [id number]  HISTORY OF PRESENT ILLNESS: I had the pleasure of seeing Garrett Miller in follow-up in the neurology clinic on 10/10/2015. The patient was last seen 4 months ago for worsening headaches. He previously had a [place] interpreter, but can speak English well without need for interpreter today. He had been having frequent headaches for the past year. Headaches are over the frontal or occipital regions, and were occurring on a near-daily basis on his last visit. He had been taking Excedrin migraine multiple times a week. Nortriptyline dose was increased to 50mg  qhs, and he reports that this has helped some, he can go a week now without headaches, but headaches would come back again, this week he had a headache all day Monday and Tuesday. He only takes Excedrin when headaches are really bad. He has occasional right-sided pulsatile tinnitus, and has noticed tinnitus would increase before the headaches worsen. No associated vomiting, vision changes, focal numbness/tingling/weakness. No hearing loss. He has some daytime drowsiness with increased nortriptyline, even if he gets 8-9 hours of sleep. He is concerned about elevated BP today. He saw his eye doctor today and was told exam was normal.  HPI: This is a 30 yo RH man with a history of hypertension, with new onset headaches that started in August 2016. He reports that headaches are either in the frontal or occipital regions, with 3/10 throbbing pain, sensitive to sounds, occurring 2-3 times a week. Sometimes he wakes up with a headache. Headaches last a few hours, he rarely takes medications, except when pain increases and takes Tylenol. He reports that headaches started after he was started on Lisinopril 3 months ago. He reports that during the process of changing from one medication to another, he started having headaches. On review of records from PCP, he reported headaches in June 2016 after stopping  Bisprolol-HCTZ due to chest pains. Due to concern that headache was returning, he was started on Novasc. Per records, he started taking BP medication 6 years ago but began to develop regular headaches 3-4 years ago and [place] brain reported normal. The patient reports he was given a prescription for headaches, but did not take it and headaches resolved spontaneously, which he attributed to stress. On his return to PCP after Norvasc, it was felt that he has an underlying headache disorder that beta-blockers were likely helping with, he was started back on Bisoprolol-HCTZ and Norvasc was discontinued. There are no records on EPIC when patient was switched, but he reports that he has been taking Lisinopril for the past 3 months and is not taking Bisoprolol-HCTZ anymore. He denies any history of head injuries, no family history of headaches.   PAST MEDICAL HISTORY: Past Medical History:  Diagnosis Date  . Hypertension     MEDICATIONS: Current Outpatient Prescriptions on File Prior to Visit  Medication Sig Dispense Refill  . esomeprazole (NEXIUM) 40 MG capsule Take 1 capsule (40 mg total) by mouth 2 (two) times daily before a meal. 28 capsule 0  . lisinopril (PRINIVIL,ZESTRIL) 10 MG tablet Take 1 tablet (10 mg total) by mouth daily. 30 tablet 6  . nortriptyline (PAMELOR) 25 MG capsule Take 1 capsule at night for 1 week, then increase to 2 capsules at night (Patient taking differently: Take 50 mg by mouth at bedtime. ) 60 capsule 11  . oxyCODONE (OXY IR/ROXICODONE) 5 MG immediate release tablet Take 1-2 tablets (5-10 mg total) by mouth  every 6 (six) hours as needed for moderate pain or severe pain. 30 tablet 0   No current facility-administered medications on file prior to visit.     ALLERGIES: No Known Allergies  FAMILY HISTORY: Family History  Problem Relation Age of Onset  . Hypertension Mother     SOCIAL HISTORY: Social History   Social  History  . Marital status: Single    Spouse name: N/A  . Number of children: 1  . Years of education: N/A   Occupational History  . Warehouse    Social History Main Topics  . Smoking status: Never Smoker  . Smokeless tobacco: Never Used  . Alcohol use No  . Drug use: No  . Sexual activity: Yes   Other Topics Concern  . Not on file   Social History Narrative   Marital status: single; dating seriously x 10 years.  From [place]; [place] since 10 years.      Children: one child.      Employment: Naval architect.    REVIEW OF SYSTEMS: Constitutional: No fevers, chills, or sweats, no generalized fatigue, change in appetite Eyes: No visual changes, double vision, eye pain Ear, nose and throat: No hearing loss, ear pain, nasal congestion, sore throat Cardiovascular: No chest pain, palpitations Respiratory:  No shortness of breath at rest or with exertion, wheezes GastrointestinaI: No nausea, vomiting, diarrhea, abdominal pain, fecal incontinence Genitourinary:  No dysuria, urinary retention or frequency Musculoskeletal:  No neck pain, back pain Integumentary: No rash, pruritus, skin lesions Neurological: as above Psychiatric: No depression, insomnia, +anxiety Endocrine: No palpitations, fatigue, diaphoresis, mood swings, change in appetite, change in weight, increased thirst Hematologic/Lymphatic:  No anemia, purpura, petechiae. Allergic/Immunologic: no itchy/runny eyes, nasal congestion, recent allergic reactions, rashes  PHYSICAL EXAM: Vitals:   10/10/15 1442  BP: (!) 150/72  Pulse: 73  Temp: 98.4 F (36.9 C)   General: No acute distress Head:  Normocephalic/atraumatic Neck: supple, no paraspinal tenderness, full range of motion Heart:  Regular rate and rhythm Lungs:  Clear to auscultation bilaterally Back: No paraspinal tenderness Skin/Extremities: No rash, no edema Neurological Exam: alert and oriented to person, place, and time. No aphasia or dysarthria. Fund of knowledge is  appropriate.  Recent and remote memory are intact.  Attention and concentration are normal.    Able to name objects and repeat phrases. Cranial nerves: Pupils equal, round, reactive to light.  Fundoscopic exam unremarkable, no papilledema. Extraocular movements intact with no nystagmus. Visual fields full. Facial sensation intact. No facial asymmetry. Tongue, uvula, palate midline.  Motor: Bulk and tone normal, muscle strength 5/5 throughout with no pronator drift.  Sensation to light touch intact.  No extinction to double simultaneous stimulation.  Deep tendon reflexes +2 throughout, toes downgoing.  Finger to nose testing intact.  Gait narrow-based and steady, able to tandem walk adequately.  Romberg negative.  IMPRESSION: This is a 30 yo RH man with a history of hypertension with new onset headaches, right pulsatile tinnitus. Neurological exam normal. He has been unable to do MRI yet. MRI brain with and without contrast and MRA head without contrast will be ordered to assess for underlying structural abnormality, particularly with pulsatile tinnitus (r/o aneurysm). He was given option to switch to a different headache preventative medication, but would like to stay on nortriptyline to 50mg  qhs at this time. He knows to minimize Excedrin migraine intake to 2-3 a week to avoid rebound headaches. He will follow-up after the MRI. He was advised to obtain a PCP and  monitor BP.  Thank you for allowing me to participate in his care.  Please do not hesitate to call for any questions or concerns.  The duration of this appointment visit was 15 minutes of face-to-face time with the patient.  Greater than 50% of this time was spent in counseling, explanation of diagnosis, planning of further management, and coordination of care.   Patrcia Dolly, M.D.

## 2015-10-24 ENCOUNTER — Ambulatory Visit
Admission: RE | Admit: 2015-10-24 | Discharge: 2015-10-24 | Disposition: A | Discharge status: Home / Self Care | Payer: Medicaid Other | Source: Ambulatory Visit | Attending: Neurology | Admitting: Neurology

## 2015-10-24 DIAGNOSIS — R519 Headache, unspecified: Secondary | ICD-10-CM

## 2015-10-24 DIAGNOSIS — H93A1 Pulsatile tinnitus, right ear: Secondary | ICD-10-CM

## 2015-10-24 DIAGNOSIS — R51 Headache: Principal | ICD-10-CM

## 2015-10-24 DIAGNOSIS — I10 Essential (primary) hypertension: Secondary | ICD-10-CM

## 2015-10-24 MED ORDER — GADOBENATE DIMEGLUMINE 529 MG/ML IV SOLN
15.0000 mL | Freq: Once | INTRAVENOUS | Status: AC | PRN
  Administered 2015-10-24: 15 mL via INTRAVENOUS

## 2016-01-12 ENCOUNTER — Encounter: Payer: Self-pay | Admitting: Neurology

## 2016-01-12 ENCOUNTER — Ambulatory Visit (INDEPENDENT_AMBULATORY_CARE_PROVIDER_SITE_OTHER): Payer: Medicaid Other | Admitting: Neurology

## 2016-01-12 VITALS — BP 138/76 | HR 108 | Ht 70.0 in | Wt 175.0 lb

## 2016-01-12 DIAGNOSIS — G44219 Episodic tension-type headache, not intractable: Secondary | ICD-10-CM | POA: Diagnosis not present

## 2016-01-12 NOTE — Patient Instructions (Signed)
1. Start reducing nortriptyline 25mg : take 1 capsule at night for 2 weeks, then if no headaches, can stop medication. If headaches return, restart taking 2 capsules at night and let us know if you need refills. 2. Follow-up in 4 months, call for any changes

## 2016-01-12 NOTE — Progress Notes (Signed)
NEUROLOGY FOLLOW UP OFFICE NOTE  Garrett Primassteban [place] [id number]  HISTORY OF PRESENT ILLNESS: I had the pleasure of seeing Garrett Miller in follow-up in the neurology clinic on 01/12/2016. The patient was last seen 3 months ago for worsening headaches. A Spanish interpreter helps with translation today. He had been having frequent headaches for the past year. Headaches were over the frontal or occipital regions, and were previously occurring on a near-daily basis. He had been taking Excedrin migraine multiple times a week. Nortriptyline dose was increased to 50mg  qhs, and he reported that this has helped some, he can go a week without headaches. I personally reviewed MRI and MRA brain, which was normal. He had reported right-sided tinnitus that worsens with the headaches. He opted to stay on nortriptyline 50mg  qhs on his last visit. Since then, he reports doing very good. He has had only 1 or 2 headaches in the past month. He still has the right-sided tinnitus. He denies any dizziness, diplopia, focal numbness/tingling/weakness, no falls. He is interested in stopping medication.   HPI: This is a 30 yo RH man with a history of hypertension, with new onset headaches that started in August 2016. He reports that headaches are either in the frontal or occipital regions, with 3/10 throbbing pain, sensitive to sounds, occurring 2-3 times a week. Sometimes he wakes up with a headache. Headaches last a few hours, he rarely takes medications, except when pain increases and takes Tylenol. He reports that headaches started after he was started on Lisinopril 3 months ago. He reports that during the process of changing from one medication to another, he started having headaches. On review of records from PCP, he reported headaches in June 2016 after stopping Bisprolol-HCTZ due to chest pains. Due to concern that headache was returning, he was started on Novasc. Per records, he started taking BP medication 6 years ago but  began to develop regular headaches 3-4 years ago and was [place], MRI brain reported normal. The patient reports he was given a prescription for headaches, but did not take it and headaches resolved spontaneously, which he attributed to stress. On his return to PCP after Norvasc, it was felt that he has an underlying headache disorder that beta-blockers were likely helping with, he was started back on Bisoprolol-HCTZ and Norvasc was discontinued. There are no records on EPIC when patient was switched, but he reports that he has been taking Lisinopril for the past 3 months and is not taking Bisoprolol-HCTZ anymore. He denies any history of head injuries, no family history of headaches.   PAST MEDICAL HISTORY: Past Medical History:  Diagnosis Date  . Hypertension     MEDICATIONS: Current Outpatient Prescriptions on File Prior to Visit  Medication Sig Dispense Refill  . lisinopril (PRINIVIL,ZESTRIL) 10 MG tablet Take 1 tablet (10 mg total) by mouth daily. 30 tablet 6  . nortriptyline (PAMELOR) 25 MG capsule Take 1 capsule at night for 1 week, then increase to 2 capsules at night (Patient taking differently: Take 50 mg by mouth at bedtime. ) 60 capsule 11  . esomeprazole (NEXIUM) 40 MG capsule Take 1 capsule (40 mg total) by mouth 2 (two) times daily before a meal. (Patient not taking: Reported on 01/12/2016) 28 capsule 0  . oxyCODONE (OXY IR/ROXICODONE) 5 MG immediate release tablet Take 1-2 tablets (5-10 mg total) by mouth every 6 (six) hours as needed for moderate pain or severe pain. (Patient not taking: Reported on 01/12/2016) 30 tablet 0  No current facility-administered medications on file prior to visit.     ALLERGIES: No Known Allergies  FAMILY HISTORY: Family History  Problem Relation Age of Onset  . Hypertension Mother     SOCIAL HISTORY: Social History   Social History  . Marital status: Single    Spouse name: N/A  . Number of children: 1   . Years of education: N/A   Occupational History  . Warehouse    Social History Main Topics  . Smoking status: Never Smoker  . Smokeless tobacco: Never Used  . Alcohol use No  . Drug use: No  . Sexual activity: Yes   Other Topics Concern  . Not on file   Social History Narrative   Marital status: single; dating seriously x 10 years.  From [place]; [place] since 10 years.      Children: one child.      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    REVIEW OF SYSTEMS: Constitutional: No fevers, chills, or sweats, no generalized fatigue, change in appetite Eyes: No visual changes, double vision, eye pain Ear, nose and throat: No hearing loss, ear pain, nasal congestion, sore throat Cardiovascular: No chest pain, palpitations Respiratory:  No shortness of breath at rest or with exertion, wheezes GastrointestinaI: No nausea, vomiting, diarrhea, abdominal pain, fecal incontinence Genitourinary:  No dysuria, urinary retention or frequency Musculoskeletal:  No neck pain, back pain Integumentary: No rash, pruritus, skin lesions Neurological: as above Psychiatric: No depression, insomnia, +anxiety Endocrine: No palpitations, fatigue, diaphoresis, mood swings, change in appetite, change in weight, increased thirst Hematologic/Lymphatic:  No anemia, purpura, petechiae. Allergic/Immunologic: no itchy/runny eyes, nasal congestion, recent allergic reactions, rashes  PHYSICAL EXAM: Vitals:   01/12/16 1531  BP: 138/76  Pulse: (!) 108   General: No acute distress Head:  Normocephalic/atraumatic Neck: supple, no paraspinal tenderness, full range of motion Heart:  Regular rate and rhythm Lungs:  Clear to auscultation bilaterally Back: No paraspinal tenderness Skin/Extremities: No rash, no edema Neurological Exam: alert and oriented to person, place, and time. No aphasia or dysarthria. Fund of knowledge is appropriate.  Recent and remote memory are intact.  Attention and concentration are normal.    Able to  name objects and repeat phrases. Cranial nerves: Pupils equal, round, reactive to light. Extraocular movements intact with no nystagmus. Visual fields full. Facial sensation intact. No facial asymmetry. Tongue, uvula, palate midline.  Motor: Bulk and tone normal, muscle strength 5/5 throughout with no pronator drift.  Sensation to light touch intact.  No extinction to double simultaneous stimulation.  Deep tendon reflexes +2 throughout, toes downgoing.  Finger to nose testing intact.  Gait narrow-based and steady, able to tandem walk adequately.  Romberg negative.  IMPRESSION: This is a 30 yo RH man with a history of hypertension with new onset headaches, right pulsatile tinnitus. Neurological exam normal. MRI brain and MRA head normal. Headaches likely tension-type headaches, he has had good response to nortriptyline 50mg  qhs and is interested in tapering off medication. He will reduce to 25mg  qhs x 2 weeks, then stop, unless headaches recur, at which point we discussed staying on nortriptyline a little longer. He asked about tinnitus, and was advised to see ENT as well. He will follow-up in 4 months and knows to call for any changes.   Thank you for allowing me to participate in his care.  Please do not hesitate to call for any questions or concerns.  The duration of this appointment visit was 25 minutes of face-to-face time with the patient.  Greater than 50% of this time was spent in counseling, explanation of diagnosis, planning of further management, and coordination of care.   Patrcia Dolly, M.D.

## 2016-01-21 ENCOUNTER — Other Ambulatory Visit: Payer: Self-pay | Admitting: Family Medicine

## 2016-01-24 NOTE — Telephone Encounter (Signed)
I have called patient to advise he is due for a follow up for refill of his BP meds, he was last here in May for abdominal pain/ and Dr Milus GlazierLauenstein refilled for 6231m. Ok to refill one month supply, he states he will call for appointment

## 2016-01-27 NOTE — Telephone Encounter (Signed)
agree

## 2016-03-27 ENCOUNTER — Ambulatory Visit (INDEPENDENT_AMBULATORY_CARE_PROVIDER_SITE_OTHER): Payer: Self-pay

## 2016-03-27 ENCOUNTER — Ambulatory Visit (INDEPENDENT_AMBULATORY_CARE_PROVIDER_SITE_OTHER): Payer: Self-pay | Admitting: Family Medicine

## 2016-03-27 VITALS — BP 138/62 | HR 88 | Temp 97.8°F | Resp 16 | Ht 70.0 in | Wt 177.0 lb

## 2016-03-27 DIAGNOSIS — R1032 Left lower quadrant pain: Secondary | ICD-10-CM

## 2016-03-27 DIAGNOSIS — R102 Pelvic and perineal pain: Secondary | ICD-10-CM

## 2016-03-27 DIAGNOSIS — K5909 Other constipation: Secondary | ICD-10-CM

## 2016-03-27 LAB — POCT CBC
Granulocyte percent: 67.7 %G (ref 37–80)
HEMATOCRIT: 46.3 % (ref 43.5–53.7)
Hemoglobin: 16.2 g/dL (ref 14.1–18.1)
LYMPH, POC: 1.8 (ref 0.6–3.4)
MCH, POC: 31.6 pg — AB (ref 27–31.2)
MCHC: 34.9 g/dL (ref 31.8–35.4)
MCV: 90.4 fL (ref 80–97)
MID (CBC): 0.3 (ref 0–0.9)
MPV: 7.8 fL (ref 0–99.8)
POC GRANULOCYTE: 4.3 (ref 2–6.9)
POC LYMPH %: 28.2 % (ref 10–50)
POC MID %: 4.1 % (ref 0–12)
Platelet Count, POC: 219 10*3/uL (ref 142–424)
RBC: 5.11 M/uL (ref 4.69–6.13)
RDW, POC: 12.2 %
WBC: 6.4 10*3/uL (ref 4.6–10.2)

## 2016-03-27 LAB — POCT URINALYSIS DIP (MANUAL ENTRY)
Bilirubin, UA: NEGATIVE
Blood, UA: NEGATIVE
Glucose, UA: NEGATIVE
Ketones, POC UA: NEGATIVE
LEUKOCYTES UA: NEGATIVE
Nitrite, UA: NEGATIVE
PH UA: 7
Protein Ur, POC: NEGATIVE
Spec Grav, UA: 1.01
Urobilinogen, UA: 0.2

## 2016-03-27 LAB — POC MICROSCOPIC URINALYSIS (UMFC): MUCUS RE: ABSENT

## 2016-03-27 MED ORDER — POLYETHYLENE GLYCOL 3350 17 GM/SCOOP PO POWD: 17.0000 g | Freq: Two times a day (BID) | ORAL | 1 refills | Status: DC | PRN

## 2016-03-27 MED ORDER — PSYLLIUM 28 % PO PACK: 1.0000 | PACK | Freq: Two times a day (BID) | ORAL | 6 refills | Status: DC

## 2016-03-27 NOTE — Patient Instructions (Addendum)
  Constipation, occasional: Oral: 17 g (~1 heaping tablespoon) dissolved in 120 to 240 mL (4 to 8 ounces) of beverage, once daily for 3 days for constipation.  Daily prevention of constipation Take a dose mixed with 8 ounces of water before every meal twice a day then drink 8 ounces of water after drinking    IF you received an x-ray today, you will receive an invoice from [facility]. Please contact [facility] at [phone] with questions or concerns regarding your invoice.   IF you received labwork today, you will receive an invoice from [facility]. Please contact [facility] at [phone] with questions or concerns regarding your invoice.   Our billing staff will not be able to assist you with questions regarding bills from these companies.  You will be contacted with the lab results as soon as they are available. The fastest way to get your results is to activate your My Chart account. Instructions are located on the last page of this paperwork. If you have not heard from us regarding the results in 2 weeks, please contact this office.

## 2016-03-27 NOTE — Progress Notes (Signed)
Chief Complaint  Patient presents with  . Abdominal Pain  . Constipation    Has bowel movement x 4-5 a day, in small amounts  . Back Pain    HPI  [name] translating  One week history of abdominal pain that is lower abdomen No pain with urination There is pain with bm He has been having 5/10 pain is after bowel movements for 2-3 hours He has not been able to take a normal bm for a week  His BM are small with a reddish spot of blood He reports that he sometimes have to strains and other times it feels like diarrhea  This has not happened before  He states that a week ago he felt a little constipated He states that he feels like he goes to the [facility] but feels like he still has to go   He reports that he has a cousin who died from colon cancer at age 48.  His cousin was a nonsmoker He does not know if this was something that runs in the family and does not know of any other relatives who are affected    Past Medical History:  Diagnosis Date  . Hypertension     Current Outpatient Prescriptions  Medication Sig Dispense Refill  . lisinopril (PRINIVIL,ZESTRIL) 10 MG tablet TAKE 1 TABLET(10 MG) BY MOUTH DAILY 30 tablet 0  . nortriptyline (PAMELOR) 25 MG capsule Take 1 capsule at night for 1 week, then increase to 2 capsules at night (Patient taking differently: Take 50 mg by mouth at bedtime. ) 60 capsule 11  . oxyCODONE (OXY IR/ROXICODONE) 5 MG immediate release tablet Take 1-2 tablets (5-10 mg total) by mouth every 6 (six) hours as needed for moderate pain or severe pain. (Patient not taking: Reported on 01/12/2016) 30 tablet 0  . polyethylene glycol powder (GLYCOLAX/MIRALAX) powder Take 17 g by mouth 2 (two) times daily as needed. 3350 g 1  . psyllium (METAMUCIL SMOOTH TEXTURE) 28 % packet Take 1 packet by mouth 2 (two) times daily. 30 packet 6   No current facility-administered medications for this visit.     Allergies: No Known Allergies  Past Surgical History:    Procedure Laterality Date  . CHOLECYSTECTOMY  2013  . LAPAROSCOPIC APPENDECTOMY N/A 06/24/2015   Procedure: APPENDECTOMY LAPAROSCOPIC;  Surgeon: De Blanch Kinsinger, MD;  Location: [facility] OR;  Service: General;  Laterality: N/A;    Social History   Social History  . Marital status: Single    Spouse name: N/A  . Number of children: 1  . Years of education: N/A   Occupational History  . Warehouse    Social History Main Topics  . Smoking status: Never Smoker  . Smokeless tobacco: Never Used  . Alcohol use No  . Drug use: No  . Sexual activity: Yes   Other Topics Concern  . None   Social History Narrative   Marital status: single; dating seriously x 10 years.  From [place]; [place] since 10 years.      Children: one child.      Employment: Naval architect.    ROS  Objective: Vitals:   03/27/16 1421  BP: 138/62  Pulse: 88  Resp: 16  Temp: 97.8 F (36.6 C)  TempSrc: Oral  SpO2: 100%  Weight: 177 lb (80.3 kg)  Height: 5\' 10"  (1.778 m)    Physical Exam  Constitutional: He is oriented to person, place, and time. He appears well-developed and well-nourished.  HENT:  Head: Normocephalic and atraumatic.  Right Ear: External ear normal.  Left Ear: External ear normal.  Nose: Nose normal.  Mouth/Throat: Oropharynx is clear and moist.  Eyes: Conjunctivae and EOM are normal.  Cardiovascular: Normal rate, regular rhythm and normal heart sounds.   No murmur heard. Pulmonary/Chest: Effort normal and breath sounds normal. No respiratory distress. He has no wheezes. He has no rales.  Abdominal: Soft. Normal appearance and bowel sounds are normal. He exhibits no distension, no pulsatile liver, no fluid wave and no ascites. There is tenderness in the suprapubic area and left lower quadrant. There is no rigidity, no rebound, no guarding, no CVA tenderness, no tenderness at McBurney's point and negative Murphy's sign.  Neurological: He is alert and oriented to person, place, and time. No  cranial nerve deficit.  Skin: Skin is warm. Capillary refill takes less than 2 seconds. No rash noted. No erythema.  Psychiatric: He has a normal mood and affect. His behavior is normal. Judgment and thought content normal.    Study Result   CLINICAL DATA:  Low abdominal pain  EXAM: ABDOMEN - 1 VIEW  COMPARISON:  None.  FINDINGS: The bowel gas pattern is normal. No radio-opaque calculi or other significant radiographic abnormality are seen.  IMPRESSION: Negative.   Electronically Signed   By: Elige KoHetal  Patel   On: 03/27/2016 15:30    Assessment and Plan [name] was seen today for abdominal pain, constipation and back pain.  Diagnoses and all orders for this visit:  Suprapubic pain- UA clear -     POCT urinalysis dipstick -     POCT Microscopic Urinalysis ([facility])  Abdominal pain, left lower quadrant- normal bowel pattern -     DG Abd 1 View -     POCT CBC  Chronic constipation- Pain due to constipation Gave pt a plan for evacuation in the next 3 days with miralax Advised metamucil daily -     polyethylene glycol powder (GLYCOLAX/MIRALAX) powder; Take 17 g by mouth 2 (two) times daily as needed. -     psyllium (METAMUCIL SMOOTH TEXTURE) 28 % packet; Take 1 packet by mouth 2 (two) times daily.     Chaquita Basques A Fenton Candee

## 2016-04-28 ENCOUNTER — Ambulatory Visit: Payer: Self-pay | Admitting: Family Medicine

## 2016-05-12 ENCOUNTER — Ambulatory Visit: Payer: Medicaid Other | Admitting: Neurology

## 2016-06-05 ENCOUNTER — Other Ambulatory Visit: Payer: Self-pay | Admitting: Neurology

## 2016-06-05 DIAGNOSIS — R519 Headache, unspecified: Secondary | ICD-10-CM

## 2016-06-05 DIAGNOSIS — R51 Headache: Principal | ICD-10-CM

## 2016-08-27 ENCOUNTER — Ambulatory Visit (INDEPENDENT_AMBULATORY_CARE_PROVIDER_SITE_OTHER): Payer: BLUE CROSS/BLUE SHIELD | Admitting: Neurology

## 2016-08-27 ENCOUNTER — Encounter: Payer: Self-pay | Admitting: Neurology

## 2016-08-27 VITALS — BP 132/70 | HR 74 | Ht 69.0 in | Wt 175.0 lb

## 2016-08-27 DIAGNOSIS — G44219 Episodic tension-type headache, not intractable: Secondary | ICD-10-CM

## 2016-08-27 NOTE — Progress Notes (Signed)
NEUROLOGY FOLLOW UP OFFICE NOTE  Garrett Miller [id number]  HISTORY OF PRESENT ILLNESS: I had the pleasure of seeing Garrett Miller in follow-up in the neurology clinic on 08/27/2016. The patient was last seen 8 months ago after he initially presented for headaches occurring on a near daily basis for a year. MRI and MRA brain normal. He had excellent response to nortriptyline, and tapered down last April and stopped it in May. Since then, he has had around 3 headaches a month lasting a few hours, no nausea/vomiting. He does not take anything unless really bad. Overall he has been feeling pretty good. He had some sleep difficulties stopping nortriptyline, improved but still with some problems. He has some neck pain. He denies any vision changes, dizziness, focal numbness/tingling/weakness, no falls. He reports the tinnitus is only occasional and declines ENT at this time.   HPI: This is a 31 yo RH man with a history of hypertension, with new onset headaches that started in August 2016. He reports that headaches are either in the frontal or occipital regions, with 3/10 throbbing pain, sensitive to sounds, occurring 2-3 times a week. Sometimes he wakes up with a headache. Headaches last a few hours, he rarely takes medications, except when pain increases and takes Tylenol. He reports that headaches started after he was started on Lisinopril 3 months ago. He reports that during the process of changing from one medication to another, he started having headaches. On review of records from PCP, he reported headaches in June 2016 after stopping Bisprolol-HCTZ due to chest pains. Due to concern that headache was returning, he was started on Novasc. Per records, he started taking BP medication 6 years ago but began to develop regular headaches 3-4 years ago and was evaluated [place] in [place], MRI brain reported normal. The patient reports he was given a prescription for headaches, but did not take it  and headaches resolved spontaneously, which he attributed to stress. On his return to PCP after Norvasc, it was felt that he has an underlying headache disorder that beta-blockers were likely helping with, he was started back on Bisoprolol-HCTZ and Norvasc was discontinued. There are no records on EPIC when patient was switched, but he reports that he has been taking Lisinopril for the past 3 months and is not taking Bisoprolol-HCTZ anymore. He denies any history of head injuries, no family history of headaches.   PAST MEDICAL HISTORY: Past Medical History:  Diagnosis Date  . Hypertension     MEDICATIONS: Current Outpatient Prescriptions on File Prior to Visit  Medication Sig Dispense Refill  . lisinopril (PRINIVIL,ZESTRIL) 10 MG tablet TAKE 1 TABLET(10 MG) BY MOUTH DAILY 30 tablet 0   No current facility-administered medications on file prior to visit.     ALLERGIES: No Known Allergies  FAMILY HISTORY: Family History  Problem Relation Age of Onset  . Hypertension Mother     SOCIAL HISTORY: Social History   Social History  . Marital status: Single    Spouse name: N/A  . Number of children: 1  . Years of education: N/A   Occupational History  . Warehouse    Social History Main Topics  . Smoking status: Never Smoker  . Smokeless tobacco: Never Used  . Alcohol use No  . Drug use: No  . Sexual activity: Yes   Other Topics Concern  . Not on file   Social History Narrative   Marital status: single; dating seriously x 10 years.  From [place]; [place] since  10 years.      Children: one child.      Employment: Naval architect.    REVIEW OF SYSTEMS: Constitutional: No fevers, chills, or sweats, no generalized fatigue, change in appetite Eyes: No visual changes, double vision, eye pain Ear, nose and throat: No hearing loss, ear pain, nasal congestion, sore throat Cardiovascular: No chest pain, palpitations Respiratory:  No shortness of breath at rest or with exertion,  wheezes GastrointestinaI: No nausea, vomiting, diarrhea, abdominal pain, fecal incontinence Genitourinary:  No dysuria, urinary retention or frequency Musculoskeletal:  +neck pain,no back pain Integumentary: No rash, pruritus, skin lesions Neurological: as above Psychiatric: No depression, insomnia, +anxiety Endocrine: No palpitations, fatigue, diaphoresis, mood swings, change in appetite, change in weight, increased thirst Hematologic/Lymphatic:  No anemia, purpura, petechiae. Allergic/Immunologic: no itchy/runny eyes, nasal congestion, recent allergic reactions, rashes  PHYSICAL EXAM: Vitals:   08/27/16 0829  BP: 132/70  Pulse: 74   General: No acute distress Head:  Normocephalic/atraumatic Neck: supple, no paraspinal tenderness, full range of motion Heart:  Regular rate and rhythm Lungs:  Clear to auscultation bilaterally Back: No paraspinal tenderness Skin/Extremities: No rash, no edema Neurological Exam: alert and oriented to person, place, and time. No aphasia or dysarthria. Fund of knowledge is appropriate.  Recent and remote memory are intact.  Attention and concentration are normal.    Able to name objects and repeat phrases. Cranial nerves: Pupils equal, round, reactive to light. Extraocular movements intact with no nystagmus. Visual fields full. Facial sensation intact. No facial asymmetry. Tongue, uvula, palate midline.  Motor: Bulk and tone normal, muscle strength 5/5 throughout with no pronator drift.  Sensation to light touch intact.  No extinction to double simultaneous stimulation.  Deep tendon reflexes +2 throughout, toes downgoing.  Finger to nose testing intact.  Gait narrow-based and steady, able to tandem walk adequately.  Romberg negative.  IMPRESSION: This is a 31 yo RH man with a history of hypertension who presented with new onset near-dailyheadaches, right pulsatile tinnitus. Neurological exam normal. MRI brain and MRA head normal. Headaches likely tension-type  headaches, he had an excellent response to nortriptyline, and has tapered it off last May without recurrence of significant headaches. He reports sleep difficulties with stopping nortriptyline and will try melatonin. He will follow-up on a prn basis and knows to call for any changes.   Thank you for allowing me to participate in his care.  Please do not hesitate to call for any questions or concerns.  The duration of this appointment visit was 15 minutes of face-to-face time with the patient.  Greater than 50% of this time was spent in counseling, explanation of diagnosis, planning of further management, and coordination of care.   Patrcia Dolly, M.D.

## 2016-08-27 NOTE — Patient Instructions (Signed)
1. Try melatonin 5mg  at bedtime to help with sleep 2. If sleep problems continue, talk to your family doctor 3. Follow-up on as needed basis, call for any changes

## 2017-05-13 ENCOUNTER — Encounter: Payer: Self-pay | Admitting: Urgent Care

## 2017-05-13 ENCOUNTER — Other Ambulatory Visit: Payer: Self-pay

## 2017-05-13 ENCOUNTER — Ambulatory Visit: Payer: Self-pay | Admitting: Urgent Care

## 2017-05-13 VITALS — BP 106/60 | HR 64 | Temp 97.7°F | Ht 70.47 in | Wt 171.4 lb

## 2017-05-13 DIAGNOSIS — R140 Abdominal distension (gaseous): Secondary | ICD-10-CM

## 2017-05-13 DIAGNOSIS — R1013 Epigastric pain: Secondary | ICD-10-CM

## 2017-05-13 DIAGNOSIS — R1011 Right upper quadrant pain: Secondary | ICD-10-CM

## 2017-05-13 DIAGNOSIS — Z9049 Acquired absence of other specified parts of digestive tract: Secondary | ICD-10-CM

## 2017-05-13 DIAGNOSIS — R142 Eructation: Secondary | ICD-10-CM

## 2017-05-13 MED ORDER — ESOMEPRAZOLE MAGNESIUM 20 MG PO CPDR: 20.0000 mg | DELAYED_RELEASE_CAPSULE | Freq: Two times a day (BID) | ORAL | 1 refills | Status: DC

## 2017-05-13 MED ORDER — FAMOTIDINE 20 MG PO TABS: 20.0000 mg | ORAL_TABLET | Freq: Two times a day (BID) | ORAL | 1 refills | Status: DC

## 2017-05-13 NOTE — Progress Notes (Signed)
    MRN: [id number] DOB: 02-Apr-1985  Subjective:   Garrett Miller is a 32 y.o. male presenting for 3 month history of abdominal bloating, epigastric pain, burping. Has been seen twice at a different clinic and was tested for stool culture, H. pylori. Both testes were negative. Has taken omeprazole, pantoprazole without any relief. Took these for a total of 2 months. Has had issues with constipation intermittently, has used Miralax for this. Has had cholecystectomy and appendectomy, done 2013, 2017, respectively. Unfortunately, patient states that he was advised to use Miralax and metamucil daily for his symptoms. He has been doing this for ~2 months.   [name] has a current medication list which includes the following prescription(s): lisinopril, polyethylene glycol 3350-grx, and psyllium. Also has No Known Allergies.  [name]  has a past medical history of Hypertension. Also  has a past surgical history that includes Cholecystectomy (2013) and laparoscopic appendectomy (N/A, 06/24/2015).  Objective:   Vitals: BP 106/60 (BP Location: Left Arm, Patient Position: Sitting, Cuff Size: Normal)   Pulse 64   Temp 97.7 F (36.5 C) (Oral)   Ht 5' 10.47" (1.79 m)   Wt 171 lb 6.4 oz (77.7 kg)   SpO2 98%   BMI 24.27 kg/m   Physical Exam  Constitutional: He is oriented to person, place, and time. He appears well-developed and well-nourished.  HENT:  Mouth/Throat: Oropharynx is clear and moist.  Eyes: No scleral icterus.  Cardiovascular: Normal rate, regular rhythm and intact distal pulses. Exam reveals no gallop and no friction rub.  No murmur heard. Pulmonary/Chest: No respiratory distress. He has no wheezes. He has no rales.  Abdominal: Soft. Bowel sounds are normal. He exhibits no distension and no mass. There is tenderness (generalized, upper). There is no guarding.  Neurological: He is alert and oriented to person, place, and time.  Skin: Skin is warm and dry.  Psychiatric: He has a normal mood  and affect.   Assessment and Plan :   Abdominal pain, epigastric - Plan: HELICOBACTER PYLORI  ANTIBODY, IGM, Comprehensive metabolic panel  Abdominal bloating  Burping  RUQ pain  History of cholecystectomy  History of appendectomy  Stop using Miralax and metamucil. Hydrate, exercise and eat more fiber. Use Nexium and Zantac for GERD although I counseled to patient that he may just need to stop taking Miralax and metamucil. Labs pending. Patient declined x-ray today.  Wallis BambergMario Elpidio Thielen, PA-C [facility] at [facility] [phone] 05/13/2017  10:18 AM

## 2017-05-13 NOTE — Patient Instructions (Addendum)
Esofagitis (Esophagitis) La esofagitis es la inflamacin del esfago, el tubo que transporta los alimentos y los lquidos desde la boca hasta el [place]. La esofagitis puede causar Counsellor. Esta afeccin puede dificultar la deglucin y producir dolor al tragar. CAUSAS La mayora de las causas de la esofagitis no son graves. Las causas ms frecuentes de esta afeccin [employer] siguientes:  Enfermedad por reflujo gastroesofgico (ERGE). Ocurre cuando el contenido estomacal regresa al esfago (reflujo).  Vmitos reiterados.  Neomia Dear reaccin de tipo alrgico, especialmente causada por alergias alimentarias (esofagitis eosinoflica).  Lesiones esofgicas al ingerir comprimidos de gran tamao con o sin agua, o algunos tipos de medicamentos.  La ingestin de sustancias qumicas dainas, como productos de limpieza domstica.  El consumo excesivo de alcohol.  Una infeccin en el esfago,ms frecuente en las personas cuyo sistema inmunitario est debilitado.  Tratamiento Water quality scientist, como la radioterapia o la quimioterapia.  Algunas enfermedades, como la sarcoidosis, la enfermedad de Crohn y la esclerodermia. SNTOMAS Los sntomas de esta afeccin incluyen lo siguiente:  Dificultad o dolor al tragar.  Dolor al tragar lquidos cidos, como los jugos de ctricos.  Dolor al [employer].  Dolor en el pecho.  Dificultad para respirar.  Nuseas.  Vmitos.  Dolor en el abdomen.  Prdida de peso.  Llagas en la boca.  Manchas de una sustancia blanca en la boca (candidiasis).  Grant Ruts.  Vmitos o tos con expectoracin de Retail buyer.  Heces negras, alquitranadas o de color rojo brillante. DIAGNSTICO El mdico le har una historia clnica y un examen fsico. Tambin pueden hacerle otros estudios, por ejemplo:  Una endoscopia para examinar el estmago y el esfago con Neomia Dear pequea cmara.  Un estudio que determina el nivel de acidez en el esfago.  Un estudio que mide  la presin que hay en el esfago.  Un estudio de deglucin de bario o un estudio modificado de deglucin de bario para mostrar la forma, el tamao y el funcionamiento del esfago.  Pruebas de alergia. TRATAMIENTO El tratamiento depende de la causa de la esofagitis. En algunos casos, se pueden administrar corticoides y otros medicamentos para ayudar a Paramedic los sntomas o tratar la causa preexistente de la afeccin. Es posible que sea necesario hacer algunos cambios en el estilo de vida, por ejemplo:  Evitar el alcohol.  Dejar de fumar.  Cambiar la dieta.  Hacer actividad fsica.  Modificar los hbitos de sueo y el entorno donde se duerme. INSTRUCCIONES PARA EL CUIDADO EN EL HOGAR Tome estas medidas para aliviar las molestias y Automotive engineer las complicaciones. Dieta  Siga la dieta que le haya recomendado el mdico, la cual puede incluir evitar alimentos y bebidas tales como: ? Caf y t (con o sin cafena). ? Bebidas que contengan alcohol. ? Bebidas energizantes y deportivas. ? Gaseosas o refrescos. ? Chocolate y cacao. ? Menta y esencias de [address] ? Ajo y cebollas. ? Rbano picante. ? Alimentos muy condimentados y cidos, entre ellos, pimientos, [place] en polvo, curry en polvo, vinagre, salsas picantes y [address] ? Frutas ctricas y sus jugos, como naranjas, limones y limas. ? Alimentos a base de tomates, como salsa roja, [place], salsa y pizza con salsa roja. ? Alimentos fritos y Lexicographer, como rosquillas, papas fritas y aderezos con alto contenido de Holiday representative. ? Carnes con alto contenido de [employer], como hot dogs y cortes grasos de carnes rojas y blancas, por ejemplo, filetes de entrecot, salchicha, jamn y tocino. ? Productos lcteos con alto contenido de [place], [place]argill  leche Atwoodentera, mantequilla y queso crema.  Haga comidas pequeas y frecuentes Freight forwarderdurante el da en lugar de comidas abundantes.  Evite beber mucho lquido con las comidas.  No coma durante las 2 o 3horas previas a la  hora de Lincroftacostarse.  No se acueste inmediatamente despus de comer.  No haga actividad fsica enseguida despus de comer.  Evite las comidas y las bebidas que parecen FedExempeorar los sntomas. Instrucciones generales  Est atento a cualquier cambio en los sntomas.  Tome los medicamentos de venta libre y los recetados solamente como se lo haya indicado el mdico. No tome aspirina, ibuprofeno ni otros antiinflamatorios no esteroides (AINE), a menos que se lo haya indicado el mdico.  Si tiene dificultar para tomar comprimidos, parta los comprimidos para reducir Brewing technologistsu tamao. Esto reducir la probabilidad de que el comprimido se atasque o lastime el esfago al pasar por all. Adems, beba agua despus de tomar un comprimido.  No consuma ningn producto que contenga tabaco, lo que incluye cigarrillos, tabaco de Theatre managermascar y Administrator, Civil Servicecigarrillos electrnicos. Si necesita ayuda para dejar de fumar, consulte al mdico.  Use ropas sueltas. No use prendas ajustadas alrededor de la cintura que ejerzan presin en el abdomen.  Levante (eleve) unas 6pulgadas (15centmetros) la cabecera de la cama.  Trate de reducir J. C. Penneyel nivel de estrs con actividades como el yoga o la meditacin. Si necesita ayuda para reducir J. C. Penneyel nivel de estrs, consulte al mdico.  Si tiene sobrepeso, Media planneradelgace hasta alcanzar un peso saludable. Hable con el mdico acerca de su peso ideal y pdale asesoramiento en cuanto a la dieta que debe seguir para Aeronautical engineerpoder alcanzarlo.  Concurra a todas las visitas de control como se lo haya indicado el mdico. Esto es importante. SOLICITE ATENCIN MDICA SI:  Aparecen nuevos sntomas.  Baja de peso sin causa aparente.  Tiene dificultad para tragar o siente dolor al Darden Restaurantshacerlo.  Tiene sibilancias o tos persistente.  Los sntomas no mejoran con Scientist, research (medical)el tratamiento.  Tiene acidez frecuente durante ms de Marsh & McLennandos semanas. SOLICITE ATENCIN MDICA DE INMEDIATO SI:  Tiene dolor intenso en los brazos, el cuello, los  Augustamaxilares, la dentadura o la espalda.  Berenice Primasranspira, se marea o tiene sensacin de desvanecimiento.  Siente falta de aire o Journalist, newspaperdolor en el pecho.  Vomita y el vmito es parecido a la sangre o a los granos de caf.  Las heces son sanguinolentas o de color negro.  Tiene fiebre.  No puede tragar, beber o comer. Esta informacin no tiene Theme park managercomo fin reemplazar el consejo del mdico. Asegrese de hacerle al mdico cualquier pregunta que tenga. Document Released: 02/01/2005 Document Revised: 10/23/2014 Document Reviewed: 05/29/2014 Elsevier Interactive Patient Education  2018 [employer]     IF you received an x-ray today, you will receive an invoice from [facility]. Please contact [facility] at [phone] with questions or concerns regarding your invoice.   IF you received labwork today, you will receive an invoice from [facility]. Please contact [facility] at [phone] with questions or concerns regarding your invoice.   Our billing staff will not be able to assist you with questions regarding bills from these companies.  You will be contacted with the lab results as soon as they are available. The fastest way to get your results is to activate your My Chart account. Instructions are located on the last page of this paperwork. If you have not heard from us regarding the results in 2 weeks, please contact this office.

## 2017-05-16 LAB — COMPREHENSIVE METABOLIC PANEL
ALK PHOS: 92 IU/L (ref 39–117)
ALT: 30 IU/L (ref 0–44)
AST: 26 IU/L (ref 0–40)
Albumin/Globulin Ratio: 1.7 (ref 1.2–2.2)
Albumin: 5 g/dL (ref 3.5–5.5)
BUN/Creatinine Ratio: 21 — ABNORMAL HIGH (ref 9–20)
BUN: 18 mg/dL (ref 6–20)
Bilirubin Total: 0.6 mg/dL (ref 0.0–1.2)
CO2: 22 mmol/L (ref 20–29)
Calcium: 10 mg/dL (ref 8.7–10.2)
Chloride: 102 mmol/L (ref 96–106)
Creatinine, Ser: 0.87 mg/dL (ref 0.76–1.27)
GFR calc Af Amer: 131 mL/min/{1.73_m2} (ref >59)
GFR calc non Af Amer: 113 mL/min/{1.73_m2} (ref >59)
GLUCOSE: 81 mg/dL (ref 65–99)
Globulin, Total: 3 g/dL (ref 1.5–4.5)
Potassium: 4.7 mmol/L (ref 3.5–5.2)
Sodium: 143 mmol/L (ref 134–144)
Total Protein: 8 g/dL (ref 6.0–8.5)

## 2017-05-16 LAB — HELICOBACTER PYLORI  ANTIBODY, IGM: H pylori, IgM Abs: <9 units (ref 0.0–8.9)

## 2017-07-07 ENCOUNTER — Ambulatory Visit (INDEPENDENT_AMBULATORY_CARE_PROVIDER_SITE_OTHER): Payer: Self-pay | Admitting: Urgent Care

## 2017-07-07 ENCOUNTER — Encounter: Payer: Self-pay | Admitting: Urgent Care

## 2017-07-07 VITALS — BP 122/60 | HR 82 | Temp 98.9°F | Resp 16 | Ht 70.0 in | Wt 171.0 lb

## 2017-07-07 DIAGNOSIS — R140 Abdominal distension (gaseous): Secondary | ICD-10-CM

## 2017-07-07 DIAGNOSIS — R0981 Nasal congestion: Secondary | ICD-10-CM

## 2017-07-07 DIAGNOSIS — J029 Acute pharyngitis, unspecified: Secondary | ICD-10-CM

## 2017-07-07 DIAGNOSIS — R142 Eructation: Secondary | ICD-10-CM

## 2017-07-07 DIAGNOSIS — I10 Essential (primary) hypertension: Secondary | ICD-10-CM

## 2017-07-07 DIAGNOSIS — J069 Acute upper respiratory infection, unspecified: Secondary | ICD-10-CM

## 2017-07-07 DIAGNOSIS — R1013 Epigastric pain: Secondary | ICD-10-CM

## 2017-07-07 DIAGNOSIS — R0989 Other specified symptoms and signs involving the circulatory and respiratory systems: Secondary | ICD-10-CM

## 2017-07-07 DIAGNOSIS — R030 Elevated blood-pressure reading, without diagnosis of hypertension: Secondary | ICD-10-CM

## 2017-07-07 DIAGNOSIS — B9789 Other viral agents as the cause of diseases classified elsewhere: Secondary | ICD-10-CM

## 2017-07-07 LAB — POCT RAPID STREP A (OFFICE): RAPID STREP A SCREEN: NEGATIVE

## 2017-07-07 MED ORDER — BENZONATATE 100 MG PO CAPS: 100.0000 mg | ORAL_CAPSULE | Freq: Three times a day (TID) | ORAL | 0 refills | Status: DC | PRN

## 2017-07-07 MED ORDER — LISINOPRIL 10 MG PO TABS: ORAL_TABLET | ORAL | 3 refills | Status: DC

## 2017-07-07 MED ORDER — HYDROCODONE-HOMATROPINE 5-1.5 MG/5ML PO SYRP: 5.0000 mL | ORAL_SOLUTION | Freq: Every evening | ORAL | 0 refills | Status: DC | PRN

## 2017-07-07 NOTE — Progress Notes (Signed)
    MRN: [id number] DOB: 28-Apr-1985  Subjective:   Garrett Miller is a 32 y.o. male presenting for 3 day history of sore throat, sinus congestion, runny nose, dry cough. Has tried Thera flu with minimal relief. Denies fever, ear pain, sinus pain, chest pain, shob, n/v, abdominal pain. Denies smoking cigarettes.  Regarding his burping and epigastric belly pain, patient reports that he is doing much better with just Nexium.  He has stopped using the laxatives.  Garrett Miller has a current medication list which includes the following prescription(s): esomeprazole, famotidine, lisinopril, polyethylene glycol 3350-grx, and psyllium. Also has No Known Allergies.  Garrett Miller  has a past medical history of Hypertension. Also  has a past surgical history that includes Cholecystectomy (2013) and laparoscopic appendectomy (N/A, 06/24/2015).  Objective:   Vitals: BP 122/60   Pulse 82   Temp 98.9 F (37.2 C) (Oral)   Resp 16   Ht  (1.778 m)   Wt 171 lb (77.6 kg)   SpO2 99%   BMI 24.54 kg/m   BP Readings from Last 3 Encounters:  07/07/17 122/60  05/13/17 106/60  08/27/16 132/70    Physical Exam  Constitutional: He is oriented to person, place, and time. He appears well-developed and well-nourished.  HENT:  Right Ear: Tympanic membrane normal.  Left Ear: Tympanic membrane normal.  Nose: Mucosal edema and rhinorrhea present. No sinus tenderness.  Mouth/Throat: No uvula swelling. Posterior oropharyngeal erythema present. No oropharyngeal exudate or posterior oropharyngeal edema.  Cardiovascular: Normal rate, regular rhythm and intact distal pulses. Exam reveals no gallop and no friction rub.  No murmur heard. Pulmonary/Chest: No respiratory distress. He has no wheezes. He has no rales.  Neurological: He is alert and oriented to person, place, and time.    Results for orders placed or performed in visit on 07/07/17 (from the past 24 hour(s))  POCT rapid strep A     Status: None   Collection Time:  07/07/17  2:51 PM  Result Value Ref Range   Rapid Strep A Screen Negative Negative   Assessment and Plan :   Viral URI with cough  Sore throat - Plan: POCT rapid strep A, Culture, Group A Strep  Sinus congestion  Runny nose  Burping  Abdominal bloating  Abdominal pain, epigastric  Recommended supportive care for what I suspect is a viral URI.  He is to use Hycodan and Tessalon for cough suppression.  Use honey-based tea for sore throat.  Strep culture is pending.  Patient is going to let me know if he has no improvement in 4 to 5 days, will use penicillin at that point to address pharyngitis.  At the end of his visit, patient requested refills of his BP medications. Reported that he has occasional headaches. Denies dizziness, confusion, hematuria, lower leg swelling. Refilled lisinopril at .   Garrett Bamberg, PA-C Primary Care at [facility] [phone] 07/07/2017  3:18 PM

## 2017-07-07 NOTE — Patient Instructions (Addendum)
Toma un galon (2 litros) de agua al dia. Para el dolor de garganta intente usar un t de miel. Use 3 cucharaditas de miel con jugo exprimido de [employer]. Coloque las piezas de [place] en 1/2 - 1 taza de agua y caliente sobre la estufa. Luego mezcle los ingredientes y repita cada 4 horas.    Infeccin respiratoria viral Viral Respiratory Infection Una infeccin respiratoria es una enfermedad que afecta parte del sistema respiratorio, como los pulmones, la nariz o la garganta. La mayora de las infecciones respiratorias son causadas por virus o bacterias. Una infeccin respiratoria causada por un virus se llama infeccin respiratoria viral. Los tipos frecuentes de infecciones respiratorias virales incluyen lo siguiente:  Un resfro.  La gripe (influenza).  Una infeccin por el virus respiratorio sincicial (VRS).  Cmo s si tengo una infeccin respiratoria viral? La mayora de las infecciones respiratorias virales causan lo siguiente:  Secrecin o congestin nasal.  Secrecin nasal amarilla o verde.  Tos.  Estornudos.  Fatiga.  Dolores musculares.  Dolor de Advertising copywriter.  Sudoracin o escalofros.  Grant Ruts.  Dolor de [place].  Cmo se tratan las infecciones respiratorias virales? Si se diagnostica gripe de manera temprana, se puede tratar con un medicamento antiviral para reducir el tiempo que una persona tiene sntomas. Los sntomas de las infecciones respiratorias virales se pueden tratar con medicamentos de venta libre y recetados, como por ejemplo:  Expectorantes. Estos medicamentos facilitan la expulsin de la mucosidad al toser.  Aerosol nasal descongestivo.  Los mdicos no recetan antibiticos para las infecciones virales. La explicacin es que los antibiticos estn diseados para matar bacterias. No tienen ningn Le[employer] virus. Cmo s si debo quedarme en casa en lugar de ir al Aleen Campi o a la escuela? Para evitar exponer a Economist a su  infeccin respiratoria viral, qudese en su casa si tiene los siguientes sntomas:  Fiebre.  Tos persistente.  Dolor de Advertising copywriter.  Secrecin nasal.  Estornudos.  Dolores musculares.  Dolores de [place].  Fatiga.  Debilidad.  Escalofros.  Sudoracin.  Nuseas.  Siga estas indicaciones en su casa:  Descanse todo lo que pueda.  Tome los medicamentos de venta libre y los recetados solamente como se lo haya indicado el mdico.  Beba suficiente lquido para Pharmacologist la orina clara o de color amarillo plido. Esto ayuda a Agricultural engineer y ayuda a Risk manager mucosidad.  Haga grgaras con una mezcla de agua y sal 3 o 4veces al da, o cuando sea necesario. Para preparar la mezcla de agua con sal, disuelva totalmente de media a 1cucharadita de sal en 1taza de agua tibia.  Use gotas para la nariz elaboradas con agua salada para aliviar la congestin y Education administrator la piel irritada alrededor de la Clinical cytogeneticist.  No beba alcohol.  No consuma productos que contengan tabaco, incluidos cigarrillos, tabaco de Theatre manager y Administrator, Civil Service. Si necesita ayuda para dejar de fumar, consulte al mdico. Comunquese con un mdico si:  Los sntomas duran 10das o ms.  Los sntomas empeoran con [employer].  Tiene fiebre.  Siente un dolor intenso en los senos paranasales en el rostro o en la frente.  Las glndulas en la mandbula o el cuello estn muy hinchadas. Solicite ayuda de inmediato si:  Electronics engineer u opresin en el pecho.  Le falta el aire.  Se siente mareado o como si fuera a desmayarse.  Tiene vmitos intensos y persistentes.  Se siente desorientado o confundido. Esta informacin no tiene Theme park manager  el consejo del mdico. Asegrese de hacerle al mdico cualquier pregunta que tenga. Document Released: 11/11/2004 Document Revised: 05/12/2016 Document Reviewed: 07/10/2014 Elsevier Interactive Patient Education  2018 [employer].     IF you  received an x-ray today, you will receive an invoice from [facility]. Please contact [facility] at [phone] with questions or concerns regarding your invoice.   IF you received labwork today, you will receive an invoice from [facility]. Please contact [facility] at [phone] with questions or concerns regarding your invoice.   Our billing staff will not be able to assist you with questions regarding bills from these companies.  You will be contacted with the lab results as soon as they are available. The fastest way to get your results is to activate your My Chart account. Instructions are located on the last page of this paperwork. If you have not heard from [place] regarding the results in 2 weeks, please contact this office.

## 2017-07-10 LAB — CULTURE, GROUP A STREP: Strep A Culture: NEGATIVE

## 2017-07-12 ENCOUNTER — Encounter: Payer: Self-pay | Admitting: Physician Assistant

## 2017-11-25 DIAGNOSIS — R1011 Right upper quadrant pain: Secondary | ICD-10-CM | POA: Diagnosis not present

## 2017-11-25 DIAGNOSIS — K2970 Gastritis, unspecified, without bleeding: Secondary | ICD-10-CM | POA: Diagnosis not present

## 2018-02-01 DIAGNOSIS — Z23 Encounter for immunization: Secondary | ICD-10-CM | POA: Diagnosis not present

## 2018-08-31 ENCOUNTER — Encounter: Payer: Self-pay | Admitting: Emergency Medicine

## 2018-08-31 ENCOUNTER — Telehealth (INDEPENDENT_AMBULATORY_CARE_PROVIDER_SITE_OTHER): Payer: BC Managed Care – PPO | Admitting: Emergency Medicine

## 2018-08-31 DIAGNOSIS — I10 Essential (primary) hypertension: Secondary | ICD-10-CM

## 2018-08-31 MED ORDER — LISINOPRIL 10 MG PO TABS: ORAL_TABLET | ORAL | 3 refills | Status: DC

## 2018-08-31 NOTE — Patient Instructions (Signed)
° ° ° °  If you have lab work done today you will be contacted with your lab results within the next 2 weeks.  If you have not heard from us then please contact us. The fastest way to get your results is to register for My Chart. ° ° °IF you received an x-ray today, you will receive an invoice from [facility]. Please contact [facility] at [phone] with questions or concerns regarding your invoice.  ° °IF you received labwork today, you will receive an invoice from [facility]. Please contact [facility] at [phone] with questions or concerns regarding your invoice.  ° °Our billing staff will not be able to assist you with questions regarding bills from these companies. ° °You will be contacted with the lab results as soon as they are available. The fastest way to get your results is to activate your My Chart account. Instructions are located on the last page of this paperwork. If you have not heard from us regarding the results in 2 weeks, please contact this office. °  ° ° ° °

## 2018-08-31 NOTE — Progress Notes (Signed)
Called and made appnt with patient

## 2018-08-31 NOTE — Progress Notes (Signed)
Med refill request-- lisinopril BP f/u

## 2018-08-31 NOTE — Progress Notes (Signed)
Telemedicine Encounter- SOAP NOTE Established Patient  This telephone encounter was conducted with the patient's (or proxy's) verbal consent via audio telecommunications: yes/no: Yes Patient was instructed to have this encounter in a suitably private space; and to only have persons present to whom they give permission to participate. In addition, patient identity was confirmed by use of name plus two identifiers (DOB and address).  I discussed the limitations, risks, security and privacy concerns of performing an evaluation and management service by telephone and the availability of in person appointments. I also discussed with the patient that there may be a patient responsible charge related to this service. The patient expressed understanding and agreed to proceed.  I spent a total of TIME; 0 MIN TO 60 MIN: 10 minutes talking with the patient or their proxy.  No chief complaint on file. Hypertension follow-up and medication refill  Subjective   Garrett Miller is a 33 y.o. male established patient. Telephone visit today for follow-up of hypertension and medication refill.  Patient takes lisinopril 10 mg daily.  Asymptomatic.  Has no complaints or medical concerns today.  HPI   Patient Active Problem List   Diagnosis Date Noted  . Essential hypertension 02/10/2015  . HTN (hypertension) 08/22/2013    Past Medical History:  Diagnosis Date  . Hypertension     Current Outpatient Medications  Medication Sig Dispense Refill  . lisinopril (ZESTRIL) 10 MG tablet TAKE 1 TABLET(10 MG) BY MOUTH DAILY 90 tablet 3  . pantoprazole (PROTONIX) 40 MG tablet TK 1 T PO QD     No current facility-administered medications for this visit.     No Known Allergies  Social History   Socioeconomic History  . Marital status: Single    Spouse name: Not on file  . Number of children: 1  . Years of education: Not on file  . Highest education level: Not on file  Occupational History  .  Occupation: Office manager  . Financial resource strain: Not on file  . Food insecurity    Worry: Not on file    Inability: Not on file  . Transportation needs    Medical: Not on file    Non-medical: Not on file  Tobacco Use  . Smoking status: Never Smoker  . Smokeless tobacco: Never Used  Substance and Sexual Activity  . Alcohol use: No    Alcohol/week: 0.0 standard drinks  . Drug use: No  . Sexual activity: Yes  Lifestyle  . Physical activity    Days per week: Not on file    Minutes per session: Not on file  . Stress: Not on file  Relationships  . Social Herbalist on phone: Not on file    Gets together: Not on file    Attends religious service: Not on file    Active member of club or organization: Not on file    Attends meetings of clubs or organizations: Not on file    Relationship status: Not on file  . Intimate partner violence    Fear of current or ex partner: Not on file    Emotionally abused: Not on file    Physically abused: Not on file    Forced sexual activity: Not on file  Other Topics Concern  . Not on file  Social History Narrative   Marital status: single; dating seriously x 10 years.  From [place]; [place] since 10 years.      Children: one child.  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    Review of Systems  Constitutional: Negative.  Negative for chills and fever.  HENT: Negative.  Negative for congestion and sore throat.   Respiratory: Negative.  Negative for cough and shortness of breath.   Cardiovascular: Negative.  Negative for chest pain and palpitations.  Gastrointestinal: Negative.  Negative for abdominal pain, diarrhea, nausea and vomiting.  Genitourinary: Negative.   Musculoskeletal: Negative for back pain, myalgias and neck pain.  Skin: Negative.  Negative for rash.  Neurological: Negative for dizziness and headaches.  All other systems reviewed and are negative.   Objective  Alert and oriented x3 in no apparent respiratory  distress while talking to me on the phone. Vitals as reported by the patient: There were no vitals filed for this visit.  Diagnoses and all orders for this visit:  Essential hypertension -     lisinopril (ZESTRIL) 10 MG tablet; TAKE 1 TABLET(10 MG) BY MOUTH DAILY  Clinically stable.  No medical concerns identified during this visit.  Continue present medication.  No change.  Follow-up in the office in 3 to 6 months.   I discussed the assessment and treatment plan with the patient. The patient was provided an opportunity to ask questions and all were answered. The patient agreed with the plan and demonstrated an understanding of the instructions.   The patient was advised to call back or seek an in-person evaluation if the symptoms worsen or if the condition fails to improve as anticipated.  I provided 10 minutes of non-face-to-face time during this encounter.  Georgina QuintMiguel Jose Tijana Walder, MD  [facility]

## 2018-09-26 ENCOUNTER — Ambulatory Visit: Payer: Medicaid Other | Admitting: Emergency Medicine

## 2018-09-27 ENCOUNTER — Encounter: Payer: Self-pay | Admitting: Emergency Medicine

## 2018-10-04 ENCOUNTER — Encounter: Payer: Medicaid Other | Admitting: Emergency Medicine

## 2018-10-05 ENCOUNTER — Encounter: Payer: Self-pay | Admitting: Emergency Medicine

## 2018-10-05 ENCOUNTER — Other Ambulatory Visit: Payer: Self-pay

## 2018-10-05 ENCOUNTER — Ambulatory Visit (INDEPENDENT_AMBULATORY_CARE_PROVIDER_SITE_OTHER): Payer: BC Managed Care – PPO | Admitting: Emergency Medicine

## 2018-10-05 VITALS — BP 132/82 | HR 61 | Temp 98.0°F | Resp 20 | Ht 70.28 in | Wt 168.0 lb

## 2018-10-05 DIAGNOSIS — K2100 Gastro-esophageal reflux disease with esophagitis, without bleeding: Secondary | ICD-10-CM

## 2018-10-05 DIAGNOSIS — Z23 Encounter for immunization: Secondary | ICD-10-CM | POA: Diagnosis not present

## 2018-10-05 DIAGNOSIS — R1013 Epigastric pain: Secondary | ICD-10-CM | POA: Diagnosis not present

## 2018-10-05 MED ORDER — FAMOTIDINE 40 MG PO TABS: 40.0000 mg | ORAL_TABLET | Freq: Every day | ORAL | 1 refills | Status: DC

## 2018-10-05 NOTE — Patient Instructions (Signed)
Enfermedad de reflujo gastroesofgico en los adultos Gastroesophageal Reflux Disease, Adult El reflujo gastroesofgico (RGE) ocurre cuando el cido del estmago sube por el tubo que conecta la boca con el estmago (esfago). Normalmente, la comida baja por el esfago y se mantiene en el estmago, donde se la digiere. Cuando una persona tiene RGE, los alimentos y el cido estomacal suelen volver al esfago. Usted puede tener una enfermedad llamada enfermedad de reflujo gastroesofgico (ERGE) si el reflujo:  Sucede a menudo.  Causa sntomas frecuentes o muy intensos.  Causa problemas tales como dao en el esfago. Cuando esto ocurre, el esfago duele y se hincha (inflama). Con el tiempo, la ERGE puede ocasionar pequeos agujeros (lceras) en el revestimiento del esfago. Cules son las causas? Esta afeccin se debe a un problema en el msculo que se encuentra entre el esfago y [place]. Cuando este msculo est dbil o no es normal, no se cierra correctamente para impedir que los alimentos y el cido regresen del Paramedic. El msculo puede debilitarse debido a lo siguiente:  El consumo de [place].  [place].  Tener cierto tipo de hernia (hernia de hiato).  Consumo de alcohol.  Ciertos alimentos y bebidas, como caf, chocolate, cebollas y [place]. Qu incrementa el riesgo? Es ms probable que tenga esta afeccin si:  Tiene sobrepeso.  Tiene una enfermedad que afecta el tejido conjuntivo.  [place] antiinflamatorios no esteroideos (AINE). Cules son los signos o los sntomas? Los sntomas de esta afeccin incluyen:  Acidez estomacal.  Dificultad o dolor al tragar.  Sensacin de Best boy un bulto en la garganta.  Sabor amargo en la boca.  Mal aliento.  Tener una gran cantidad de saliva.  Estmago inflamado o con Tree surgeon.  Eructos.  Dolor en el pecho. El dolor de pecho puede deberse a distintas afecciones. Asegrese de Teacher, adult education a su mdico si tiene Tourist information centre manager.  Falta  de aire o respiracin ruidosa (sibilancias).  Tos constante (crnica) o durante la noche.  Desgaste de la superficie de los dientes (esmalte dental).  Prdida de peso. Cmo se trata? El tratamiento depender de la gravedad de los sntomas. El mdico puede sugerirle lo siguiente:  Cambios en la dieta.  Medicamentos.  Clementeen Hoof. Siga estas indicaciones en su casa: Comida y bebida   Siga una dieta como se lo haya indicado el mdico. Es posible que deba evitar alimentos y bebidas, por ejemplo: ? Caf y t (con o sin cafena). ? Bebidas que contengan alcohol. ? Bebidas energticas y deportivas. ? Bebidas gaseosas y refrescos. ? Chocolate y cacao. ? Menta y [place]. ? Ajo y cebolla. ? Rbano picante. ? Alimentos cidos y condimentados. Estos incluyen todos los tipos de pimientos, [place] en polvo, curry en polvo, vinagre, salsas picantes y [employer]. ? Ctricos y sus jugos, por ejemplo, naranjas, limones y limas. ? Alimentos que AutoNation. Estos incluyen salsa roja, [place], salsa picante y pizza con salsa de [place]. ? Alimentos fritos y Radio broadcast assistant. Estos incluyen donas, papas fritas, papitas fritas de bolsa y aderezos con alto contenido de [place]. ? Carnes con alto contenido de [place]. Estas incluye los perros calientes, chuletas o costillas, embutidos, jamn y tocino. ? Productos lcteos ricos en grasas, como leche [place], [place] y [place] crema.  Consuma pequeas cantidades de comida con ms frecuencia. Evite consumir porciones abundantes.  Evite beber grandes cantidades de lquidos con las comidas.  Evite comer 2 o 3horas antes de acostarse.  Evite recostarse inmediatamente despus de comer.  No haga ejercicios  enseguida despus de comer. Estilo de vida   No consuma ningn producto que contenga nicotina o tabaco. Estos incluyen cigarrillos, cigarrillos electrnicos y tabaco para Higher education careers adviser. Si necesita ayuda para dejar de fumar, consulte al MeadWestvaco.  Intente  reducir [employer]hering[employer]ough de estrs. Si necesita ayuda para hacer esto, consulte al mdico.  Si tiene sobrepeso, baje una cantidad de peso saludable para usted. Consulte a su mdico para bajar de peso de [employer]. Indicaciones generales  Est atento a cualquier cambio en los sntomas.  Tome los medicamentos de venta libre y los recetados solamente como se lo haya indicado el mdico. No tome aspirina, ibuprofeno ni otros AINE a menos que el mdico lo autorice.  Use ropa holgada. No use nada apretado alrededor de la cintura.  Levante (eleve) la cabecera de la cama aproximadamente 6pulgadas (15cm).  Evite inclinarse si al hacerlo empeoran los sntomas.  Concurra a todas las visitas de seguimiento como se lo haya indicado el mdico. Esto es importante. Comunquese con un mdico si:  Aparecen nuevos sntomas.  Adelgaza y no sabe por qu.  Tiene problemas para tragar o le duele cuando traga.  Tiene sibilancias o tos persistente.  Los sntomas no mejoran con Dispensing optician.  Tiene la voz ronca. Solicite ayuda inmediatamente si:  D[employer] Corporation, el cuello, la [place], los dientes o la espalda.  Se siente transpirado, mareado o tiene una sensacin de desvanecimiento.  Siente falta de aire o Tourist information centre manager.  Vomita y el vmito tiene un aspecto similar a la sangre o a los posos de caf.  Pierde el conocimiento (se desmaya).  Las deposiciones (heces) son sanguinolentas o negras.  No puede tragar, beber o comer. Resumen  Si una persona tiene enfermedad de reflujo gastroesofgico (ERGE), los alimentos y el cido estomacal suben al esfago y causan sntomas o problemas tales como dao en el esfago.  El tratamiento depender de la gravedad de los sntomas.  Siga una Lexicographer se lo haya indicado el [place] todos los medicamentos solamente como se lo haya indicado el mdico. Esta informacin no tiene Marine scientist el consejo del mdico. Asegrese de  hacerle al mdico cualquier pregunta que tenga. Document Released: 03/06/2010 Document Revised: 09/15/2017 Document Reviewed: 09/15/2017 Elsevier Patient Education  2020 [employer].

## 2018-10-05 NOTE — Progress Notes (Signed)
Berenice Primassteban Wojdyla 33 y.o.   Chief Complaint  Patient presents with   Gastroesophageal Reflux    Worse x 1 wk.     Abdominal Pain    HISTORY OF PRESENT ILLNESS: This is a 33 y.o. male with history of gastroesophageal reflux has been on pantoprazole 40 mg daily for the past 1-1/2 years complaining of increased symptoms for the past week.  Complaining of burning epigastric pain radiating up into the chest and neck areas worse after he eats.  Denies nausea or vomiting.  Denies rectal bleeding or melena.  No other significant symptoms.  HPI   Prior to Admission medications   Medication Sig Start Date End Date Taking? Authorizing Provider  lisinopril (ZESTRIL) 10 MG tablet TAKE 1 TABLET(10 MG) BY MOUTH DAILY 08/31/18  Yes Georgina QuintSagardia, Decker Cogdell Jose, MD  pantoprazole (PROTONIX) 40 MG tablet TK 1 T PO QD 08/27/18  Yes [provider]    No Known Allergies  Patient Active Problem List   Diagnosis Date Noted   Essential hypertension 02/10/2015   HTN (hypertension) 08/22/2013    Past Medical History:  Diagnosis Date   Hypertension     Past Surgical History:  Procedure Laterality Date   CHOLECYSTECTOMY  2013   LAPAROSCOPIC APPENDECTOMY N/A 06/24/2015   Procedure: APPENDECTOMY LAPAROSCOPIC;  Surgeon: De BlanchLuke Aaron Kinsinger, MD;  Location: [facility] OR;  Service: General;  Laterality: N/A;    Social History   Socioeconomic History   Marital status: Single    Spouse name: Not on file   Number of children: 1   Years of education: Not on file   Highest education level: Not on file  Occupational History   Occupation: Warehouse  Social Network engineereeds   Financial resource strain: Not on file   Food insecurity    Worry: Not on file    Inability: Not on file   Transportation needs    Medical: Not on file    Non-medical: Not on file  Tobacco Use   Smoking status: Never Smoker   Smokeless tobacco: Never Used  Substance and Sexual Activity   Alcohol use: No    Alcohol/week: 0.0  standard drinks   Drug use: No   Sexual activity: Yes  Lifestyle   Physical activity    Days per week: Not on file    Minutes per session: Not on file   Stress: Not on file  Relationships   Social connections    Talks on phone: Not on file    Gets together: Not on file    Attends religious service: Not on file    Active member of club or organization: Not on file    Attends meetings of clubs or organizations: Not on file    Relationship status: Not on file   Intimate partner violence    Fear of current or ex partner: Not on file    Emotionally abused: Not on file    Physically abused: Not on file    Forced sexual activity: Not on file  Other Topics Concern   Not on file  Social History Narrative   Marital status: single; dating seriously x 10 years.  From [place].      Children: one child.      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    Family History  Problem Relation Age of Onset   Hypertension Mother      Review of Systems  Constitutional: Negative.  Negative for chills and fever.  HENT: Negative.  Negative for sore throat.  Eyes: Negative.   Respiratory: Negative.  Negative for cough and shortness of breath.   Cardiovascular: Negative.  Negative for chest pain and palpitations.  Gastrointestinal: Positive for abdominal pain and heartburn. Negative for blood in stool, diarrhea, melena, nausea and vomiting.  Genitourinary: Negative.   Skin: Negative.  Negative for rash.  Neurological: Negative.  Negative for dizziness and headaches.  Endo/Heme/Allergies: Negative.   All other systems reviewed and are negative.  Vitals:   10/05/18 1622  BP: 132/82  Pulse: 61  Resp: 20  Temp: 98 F (36.7 C)  SpO2: 98%     Physical Exam Vitals signs reviewed.  Constitutional:      Appearance: Normal appearance. He is well-developed.  HENT:     Head: Normocephalic and atraumatic.     Mouth/Throat:     Mouth: Mucous membranes are moist.     Pharynx:  Oropharynx is clear.  Eyes:     Extraocular Movements: Extraocular movements intact.     Conjunctiva/sclera: Conjunctivae normal.     Pupils: Pupils are equal, round, and reactive to light.  Neck:     Musculoskeletal: Normal range of motion.  Cardiovascular:     Rate and Rhythm: Normal rate and regular rhythm.     Pulses: Normal pulses.     Heart sounds: Normal heart sounds.  Pulmonary:     Effort: Pulmonary effort is normal.     Breath sounds: Normal breath sounds.  Abdominal:     General: Bowel sounds are normal. There is no distension.     Palpations: Abdomen is soft.     Tenderness: There is abdominal tenderness in the epigastric area. There is no guarding or rebound.  Musculoskeletal: Normal range of motion.  Skin:    General: Skin is warm and dry.     Capillary Refill: Capillary refill takes less than 2 seconds.  Neurological:     General: No focal deficit present.     Mental Status: He is alert and oriented to person, place, and time.  Psychiatric:        Mood and Affect: Mood normal.        Behavior: Behavior normal.      ASSESSMENT & PLAN: Ziv was seen today for gastroesophageal reflux and abdominal pain.  Diagnoses and all orders for this visit:  Gastroesophageal reflux disease with esophagitis -     CBC with Differential/Platelet -     Comprehensive metabolic panel -     Lipase -     famotidine (PEPCID) 40 MG tablet; Take 1 tablet (40 mg total) by mouth daily. -     Ambulatory referral to Gastroenterology  Abdominal pain, epigastric    Patient Instructions  Enfermedad de reflujo gastroesofgico en los adultos Gastroesophageal Reflux Disease, Adult El reflujo gastroesofgico (RGE) ocurre cuando el cido del estmago sube por el tubo que conecta la boca con el estmago (esfago). Normalmente, la comida baja por el esfago y se mantiene en el estmago, donde se la digiere. Cuando una persona tiene RGE, los alimentos y el cido estomacal suelen volver al  esfago. Usted puede tener una enfermedad llamada enfermedad de reflujo gastroesofgico (ERGE) si el reflujo:  Sucede a menudo.  Causa sntomas frecuentes o muy intensos.  Causa problemas tales como dao en el esfago. Cuando esto ocurre, el esfago duele y se hincha (inflama). Con el tiempo, la ERGE puede ocasionar pequeos agujeros (lceras) en el revestimiento del esfago. Cules son las causas? Esta afeccin se debe a un problema en el msculo  que se encuentra entre el esfago y [place] estmago. Cuando este msculo est dbil o no es normal, no se cierra correctamente para impedir que los alimentos y el cido regresen del Teaching laboratory technicianestmago. El msculo puede debilitarse debido a lo siguiente:  El consumo de [place]tabaco.  [place].  Tener cierto tipo de hernia (hernia de hiato).  Consumo de alcohol.  Ciertos alimentos y bebidas, como caf, chocolate, cebollas y Fredoniamenta. Qu incrementa el riesgo? Es ms probable que tenga esta afeccin si:  Tiene sobrepeso.  Tiene una enfermedad que afecta el tejido conjuntivo.  [place] antiinflamatorios no esteroideos (AINE). Cules son los signos o los sntomas? Los sntomas de esta afeccin incluyen:  Acidez estomacal.  Dificultad o dolor al tragar.  Sensacin de Warehouse managertener un bulto en la garganta.  Sabor amargo en la boca.  Mal aliento.  Tener una gran cantidad de saliva.  Estmago inflamado o con Dentistmalestar.  Eructos.  Dolor en el pecho. El dolor de pecho puede deberse a distintas afecciones. Asegrese de Science writerconsultar a su mdico si tiene Journalist, newspaperdolor en el pecho.  Falta de aire o respiracin ruidosa (sibilancias).  Tos constante (crnica) o durante la noche.  Desgaste de la superficie de los dientes (esmalte dental).  Prdida de peso. Cmo se trata? El tratamiento depender de la gravedad de los sntomas. El mdico puede sugerirle lo siguiente:  Cambios en la dieta.  Medicamentos.  Cipriano MileUna ciruga. Siga estas indicaciones en su casa: Comida y  bebida   Siga una dieta como se lo haya indicado el mdico. Es posible que deba evitar alimentos y bebidas, por ejemplo: ? Caf y t (con o sin cafena). ? Bebidas que contengan alcohol. ? Bebidas energticas y deportivas. ? Bebidas gaseosas y refrescos. ? Chocolate y cacao. ? Menta y esencia de [place]a. ? Ajo y cebolla. ? Rbano picante. ? Alimentos cidos y condimentados. Estos incluyen todos los tipos de pimientos, [place] en polvo, curry en polvo, vinagre, salsas picantes y Occidental Petroleumsalsa barbacoa. ? Ctricos y sus jugos, por ejemplo, naranjas, limones y limas. ? Alimentos que CSX Corporationcontengan tomate. Estos incluyen salsa roja, [place], salsa picante y pizza con salsa de [place]. ? Alimentos fritos y Lexicographergrasos. Estos incluyen donas, papas fritas, papitas fritas de bolsa y aderezos con alto contenido de [place]grasa. ? Carnes con alto contenido de [place]grasa. Estas incluye los perros calientes, chuletas o costillas, embutidos, jamn y tocino. ? Productos lcteos ricos en grasas, como leche [place]entera, Wain[place]ottmanteca y [place]olaqueso crema.  Consuma pequeas cantidades de comida con ms frecuencia. Evite consumir porciones abundantes.  Evite beber grandes cantidades de lquidos con las comidas.  Evite comer 2 o 3horas antes de acostarse.  Evite recostarse inmediatamente despus de comer.  No haga ejercicios enseguida despus de comer. Estilo de vida   No consuma ningn producto que contenga nicotina o tabaco. Estos incluyen cigarrillos, cigarrillos electrnicos y tabaco para Theatre managermascar. Si necesita ayuda para dejar de fumar, consulte al American Expressmdico.  Intente reducir J. C. Penneyel nivel de estrs. Si necesita ayuda para hacer esto, consulte al mdico.  Si tiene sobrepeso, baje una cantidad de peso saludable para usted. Consulte a su mdico para bajar de peso de MetLifemanera segura. Indicaciones generales  Est atento a cualquier cambio en los sntomas.  Tome los medicamentos de venta libre y los recetados solamente como se lo haya indicado el mdico. No  tome aspirina, ibuprofeno ni otros AINE a menos que el mdico lo autorice.  Use ropa holgada. No use nada apretado alrededor de la cintura.  Levante (eleve) la cabecera de  la cama aproximadamente 6pulgadas (15cm).  Evite inclinarse si al hacerlo empeoran los sntomas.  Concurra a todas las visitas de [address]seguimiento como se lo haya indicado el mdico. Esto es importante. Comunquese con un mdico si:  Aparecen nuevos sntomas.  Adelgaza y no sabe por qu.  Tiene problemas para tragar o le duele cuando traga.  Tiene sibilancias o tos persistente.  Los sntomas no mejoran con Scientist, research (medical)el tratamiento.  Tiene la voz ronca. Solicite ayuda inmediatamente si:  Goldman SachsSiente dolor en los brazos, el cuello, la [place], los dientes o la espalda.  Se siente transpirado, mareado o tiene una sensacin de desvanecimiento.  Siente falta de aire o Journalist, newspaperdolor en el pecho.  Vomita y el vmito tiene un aspecto similar a la sangre o a los posos de caf.  Pierde el conocimiento (se desmaya).  Las deposiciones (heces) son sanguinolentas o negras.  No puede tragar, beber o comer. Resumen  Si una persona tiene enfermedad de reflujo gastroesofgico (ERGE), los alimentos y el cido estomacal suben al esfago y causan sntomas o problemas tales como dao en el esfago.  El tratamiento depender de la gravedad de los sntomas.  Siga una Air traffic controllerdieta como se lo haya indicado el mdico.  Tome todos los medicamentos solamente como se lo haya indicado el mdico. Esta informacin no tiene Theme park managercomo fin reemplazar el consejo del mdico. Asegrese de hacerle al mdico cualquier pregunta que tenga. Document Released: 03/06/2010 Document Revised: 09/15/2017 Document Reviewed: 09/15/2017 Elsevier Patient Education  2020 Elsevier Inc.       Edwina BarthMiguel Raymondo Garcialopez, MD [facility]

## 2018-10-06 ENCOUNTER — Encounter: Payer: Self-pay | Admitting: Gastroenterology

## 2018-10-06 LAB — CBC WITH DIFFERENTIAL/PLATELET
Basophils Absolute: 0 10*3/uL (ref 0.0–0.2)
Basos: 1 %
EOS (ABSOLUTE): 0.2 10*3/uL (ref 0.0–0.4)
Eos: 3 %
Hematocrit: 45 % (ref 37.5–51.0)
Hemoglobin: 15.4 g/dL (ref 13.0–17.7)
Immature Grans (Abs): 0 10*3/uL (ref 0.0–0.1)
Immature Granulocytes: 0 %
Lymphocytes Absolute: 2.1 10*3/uL (ref 0.7–3.1)
Lymphs: 34 %
MCH: 30.7 pg (ref 26.6–33.0)
MCHC: 34.2 g/dL (ref 31.5–35.7)
MCV: 90 fL (ref 79–97)
Monocytes Absolute: 0.4 10*3/uL (ref 0.1–0.9)
Monocytes: 7 %
Neutrophils Absolute: 3.6 10*3/uL (ref 1.4–7.0)
Neutrophils: 55 %
Platelets: 229 10*3/uL (ref 150–450)
RBC: 5.02 x10E6/uL (ref 4.14–5.80)
RDW: 12.4 % (ref 11.6–15.4)
WBC: 6.4 10*3/uL (ref 3.4–10.8)

## 2018-10-06 LAB — COMPREHENSIVE METABOLIC PANEL
ALT: 39 IU/L (ref 0–44)
AST: 26 IU/L (ref 0–40)
Albumin/Globulin Ratio: 2.1 (ref 1.2–2.2)
Albumin: 5.2 g/dL — ABNORMAL HIGH (ref 4.0–5.0)
Alkaline Phosphatase: 78 IU/L (ref 39–117)
BUN/Creatinine Ratio: 22 — ABNORMAL HIGH (ref 9–20)
BUN: 27 mg/dL — ABNORMAL HIGH (ref 6–20)
Bilirubin Total: 0.4 mg/dL (ref 0.0–1.2)
CO2: 23 mmol/L (ref 20–29)
Calcium: 9.8 mg/dL (ref 8.7–10.2)
Chloride: 99 mmol/L (ref 96–106)
Creatinine, Ser: 1.25 mg/dL (ref 0.76–1.27)
GFR calc Af Amer: 86 mL/min/{1.73_m2} (ref >59)
GFR calc non Af Amer: 74 mL/min/{1.73_m2} (ref >59)
Globulin, Total: 2.5 g/dL (ref 1.5–4.5)
Glucose: 87 mg/dL (ref 65–99)
Potassium: 4.6 mmol/L (ref 3.5–5.2)
Sodium: 138 mmol/L (ref 134–144)
Total Protein: 7.7 g/dL (ref 6.0–8.5)

## 2018-10-06 LAB — LIPASE: Lipase: 36 U/L (ref 13–78)

## 2018-10-18 ENCOUNTER — Telehealth: Payer: Self-pay

## 2018-10-18 ENCOUNTER — Other Ambulatory Visit: Payer: Self-pay

## 2018-10-18 ENCOUNTER — Ambulatory Visit (INDEPENDENT_AMBULATORY_CARE_PROVIDER_SITE_OTHER): Payer: BC Managed Care – PPO | Admitting: Gastroenterology

## 2018-10-18 ENCOUNTER — Encounter: Payer: Self-pay | Admitting: Gastroenterology

## 2018-10-18 VITALS — BP 130/70 | HR 71 | Temp 98.3°F | Ht 71.0 in | Wt 170.2 lb

## 2018-10-18 DIAGNOSIS — R1011 Right upper quadrant pain: Secondary | ICD-10-CM

## 2018-10-18 DIAGNOSIS — R12 Heartburn: Secondary | ICD-10-CM

## 2018-10-18 NOTE — Progress Notes (Signed)
Chief Complaint: GERD, heartuburn, epigastric/RUQ pain, bloating  Referring Provider:     Georgina QuintSagardia, Miguel Jose, MD  HPI:    Garrett Miller is a 33 y.o. male with a history of reflux referred to the [facility]ology [facility] for evaluation of GERD along with epigastric pain.  Has a history of reflux, and has been treated with pantoprazole 40 mg/day for the past 1-1/2 years ago.  Index symptoms of heartburn, regurgitation, responsive to PPI therapy.  No dysphagia.    He is more bothered by his epigastric pain.  MEG pain tends to be post prandial, 5-10 mins after eating. Has been present for approx 1 year. Occurs intermittently. Can radiate to the RUQ/right flank and associated with bloating. No n/v/d/c/f/c, hematochezia, melena. No wt loss.   Was seen by his PCM for this on 8/20, and added Pepcid 40 mg/day.  Normal CBC, CMP, lipase.  No previous EGD.   CCY years ago.   No known family history of CRC, GI malignancy, liver disease, pancreatic disease, or IBD.    Past Medical History:  Diagnosis Date  . Hypertension      Past Surgical History:  Procedure Laterality Date  . CHOLECYSTECTOMY  2013  . LAPAROSCOPIC APPENDECTOMY N/A 06/24/2015   Procedure: APPENDECTOMY LAPAROSCOPIC;  Surgeon: De BlanchLuke Aaron Kinsinger, MD;  [facility];  Service: General;  Laterality: N/A;   Family History  Problem Relation Age of Onset  . Hypertension Mother   . Colon cancer Neg Hx   . Esophageal cancer Neg Hx    Social History   Tobacco Use  . Smoking status: Never Smoker  . Smokeless tobacco: Never Used  Substance Use Topics  . Alcohol use: Yes    Alcohol/week: 0.0 standard drinks    Comment: Ociassionally/ A beer once in a while   . Drug use: No   Current Outpatient Medications  Medication Sig Dispense Refill  . famotidine (PEPCID) 40 MG tablet Take 1 tablet (40 mg total) by mouth daily. 30 tablet 1  . lisinopril (ZESTRIL) 10 MG tablet TAKE 1 TABLET(10 MG) BY MOUTH DAILY 90  tablet 3  . pantoprazole (PROTONIX) 40 MG tablet TK 1 T PO QD     No current facility-administered medications for this visit.    No Known Allergies   Review of Systems: All systems reviewed and negative except where noted in HPI.     Physical Exam:    Wt Readings from Last 3 Encounters:  10/18/18 170 lb 4 oz (77.2 kg)  10/05/18 168 lb (76.2 kg)  07/07/17 171 lb (77.6 kg)    BP 130/70   Pulse 71   Temp 98.3 F (36.8 C)   Ht 5\' 11"  (1.803 m)   Wt 170 lb 4 oz (77.2 kg)   BMI 23.75 kg/m  Constitutional:  Pleasant, in no acute distress. Psychiatric: Normal mood and affect. Behavior is normal. EENT: Pupils normal.  Conjunctivae are normal. No scleral icterus. Neck supple. No cervical LAD. Cardiovascular: Normal rate, regular rhythm. No edema Pulmonary/chest: Effort normal and breath sounds normal. No wheezing, rales or rhonchi. Abdominal: Soft, nondistended, nontender. Bowel sounds active throughout. There are no masses palpable. No hepatomegaly. Neurological: Alert and oriented to person place and time. Skin: Skin is warm and dry. No rashes noted.   ASSESSMENT AND PLAN;   Garrett Miller is a 33 y.o. male presenting with:  1) MEG/RUQ pain - EGD to evaluate for mucosal/luminal etiology, with  random and directed gastric biopsies - Labs normal and s/p ccy - H pylori check  2) Heartburn - Resume PPI as prescribed - Can likely stop H2RA as the reflux seems to o/w have been under control with PPI monotherapy - Eval for EE, LES laxity, HH at time of EGD  The indications, risks, and benefits of EGD were explained to the patient in detail. Risks include but are not limited to bleeding, perforation, adverse reaction to medications, and cardiopulmonary compromise. Sequelae include but are not limited to the possibility of surgery, hositalization, and mortality. The patient verbalized understanding and wished to proceed. All questions answered, referred to scheduler. Further  recommendations pending results of the exam.     Lavena Bullion, DO, FACG  10/18/2018, 9:27 AM   [place], [place], *

## 2018-10-18 NOTE — Telephone Encounter (Signed)
Covid-19 screening questions   Do you now or have you had a fever in the last 14 days? No  Do you have any respiratory symptoms of shortness of breath or cough now or in the last 14 days? No  Do you have any family members or close contacts with diagnosed or suspected Covid-19 in the past 14 days? No  Have you been tested for Covid-19 and found to be positive? No        

## 2018-10-18 NOTE — Patient Instructions (Signed)
If you are age 35 or older, your body mass index should be between 23-30. Your Body mass index is 23.75 kg/m. If this is out of the aforementioned range listed, please consider follow up with your Primary Care Provider.  If you are age 21 or younger, your body mass index should be between 19-25. Your Body mass index is 23.75 kg/m. If this is out of the aformentioned range listed, please consider follow up with your Primary Care Provider.   To help prevent the possible spread of infection to our patients, communities, and staff; we will be implementing the following measures:  As of now we are not allowing any visitors/family members to accompany you to any upcoming appointments with [facility]. If you have any concerns about this please contact our office to discuss prior to the appointment.   Your provider has requested that you go to the basement level for lab work at our [place] location ([address] [place] [address]) . Press "B" on the elevator. The lab is located at the first door on the left as you exit the elevator. You may go at whatever time is convienent for you. The current hours of operations are Monday- Friday 7:30am-4:30pm.  You have been scheduled for an endoscopy. Please follow written instructions given to you at your visit today. If you use inhalers (even only as needed), please bring them with you on the day of your procedure. Your physician has requested that you go to www.startemmi.com and enter the access code given to you at your visit today. This web site gives a general overview about your procedure. However, you should still follow specific instructions given to you by our office regarding your preparation for the procedure.  It was a pleasure to see you today!  Vito Cirigliano, D.O.

## 2018-10-19 ENCOUNTER — Ambulatory Visit (AMBULATORY_SURGERY_CENTER): Payer: BC Managed Care – PPO | Admitting: Gastroenterology

## 2018-10-19 ENCOUNTER — Other Ambulatory Visit (INDEPENDENT_AMBULATORY_CARE_PROVIDER_SITE_OTHER): Payer: BC Managed Care – PPO

## 2018-10-19 ENCOUNTER — Encounter: Payer: Self-pay | Admitting: Gastroenterology

## 2018-10-19 VITALS — BP 102/59 | HR 56 | Temp 98.0°F | Resp 12 | Ht 71.0 in | Wt 170.0 lb

## 2018-10-19 DIAGNOSIS — R1011 Right upper quadrant pain: Secondary | ICD-10-CM

## 2018-10-19 DIAGNOSIS — K2950 Unspecified chronic gastritis without bleeding: Secondary | ICD-10-CM | POA: Diagnosis not present

## 2018-10-19 DIAGNOSIS — R12 Heartburn: Secondary | ICD-10-CM | POA: Diagnosis not present

## 2018-10-19 DIAGNOSIS — K2970 Gastritis, unspecified, without bleeding: Secondary | ICD-10-CM | POA: Diagnosis not present

## 2018-10-19 DIAGNOSIS — R1013 Epigastric pain: Secondary | ICD-10-CM

## 2018-10-19 LAB — H. PYLORI ANTIBODY, IGG: H Pylori IgG: NEGATIVE

## 2018-10-19 MED ORDER — SODIUM CHLORIDE 0.9 % IV SOLN: 500.0000 mL | Freq: Once | INTRAVENOUS | Status: DC

## 2018-10-19 NOTE — Op Note (Signed)
[facility] Patient Name: Garrett Miller Procedure Date: 10/19/2018 8:23 AM MRN: [id number] Endoscopist: Doristine LocksVito Timohty Renbarger , MD Age: [age] Referring MD:  Date of Birth: Jul 28, 1985 Gender: Male Account #: [id number] Procedure:                Upper GI endoscopy Indications:              Epigastric abdominal pain, Abdominal pain in the                            right upper quadrant, Heartburn, Suspected                            esophageal reflux Medicines:                Monitored Anesthesia Care Procedure:                Pre-Anesthesia Assessment:                           - Prior to the procedure, a History and Physical                            was performed, and patient medications and                            allergies were reviewed. The patient's tolerance of                            previous anesthesia was also reviewed. The risks                            and benefits of the procedure and the sedation                            options and risks were discussed with the patient.                            All questions were answered, and informed consent                            was obtained. Prior Anticoagulants: The patient has                            taken no previous anticoagulant or antiplatelet                            agents. ASA Grade Assessment: II - A patient with                            mild systemic disease. After reviewing the risks                            and benefits, the patient was deemed in  satisfactory condition to undergo the procedure.                           After obtaining informed consent, the endoscope was                            passed under direct vision. Throughout the                            procedure, the patient's blood pressure, pulse, and                            oxygen saturations were monitored continuously. The                            Endoscope was introduced through the mouth, and                             advanced to the second part of duodenum. The upper                            GI endoscopy was accomplished without difficulty.                            The patient tolerated the procedure well. Scope In: Scope Out: Findings:                 The examined esophagus was normal.                           The Z-line was regular and was found 42 cm from the                            incisors.                           Diffuse mild inflammation characterized by erythema                            and a salt-and-pepper appearance was found in the                            gastric fundus and in the gastric body. Biopsies                            were taken with a cold forceps for Helicobacter                            pylori testing. Estimated blood loss was minimal.                           The incisura, gastric antrum and pylorus were                            normal. Additional  biopsies were taken with a cold                            forceps for Helicobacter pylori testing and placed                            in same specimen container. Estimated blood loss                            was minimal.                           The duodenal bulb, first portion of the duodenum                            and second portion of the duodenum were normal. Complications:            No immediate complications. Estimated Blood Loss:     Estimated blood loss was minimal. Impression:               - Normal esophagus.                           - Z-line regular, 42 cm from the incisors.                           - Mild, non-ulcer gastritis. Biopsied.                           - Normal incisura, antrum and pylorus. Biopsied.                           - Normal duodenal bulb, first portion of the                            duodenum and second portion of the duodenum. Recommendation:           - Patient has a contact number available for                             emergencies. The signs and symptoms of potential                            delayed complications were discussed with the                            patient. Return to normal activities tomorrow.                            Written discharge instructions were provided to the                            patient.                           - Resume previous diet.                           -  Continue present medications.                           - Await pathology results.                           - Can stop Pepcid.                           - Pending biopsy results, and if reflux otherwise                            well controlled, can likely plan to decrease                            Protonix to 20 mg/day for ongoing control of                            heartburn/reflux symptoms. Doristine Locks, MD 10/19/2018 8:42:11 AM

## 2018-10-19 NOTE — Progress Notes (Signed)
Called to room to assist during endoscopic procedure.  Patient ID and intended procedure confirmed with present staff. Received instructions for my participation in the procedure from the performing physician.  

## 2018-10-19 NOTE — Patient Instructions (Signed)
Can stop pepcid  Will decide if need to decrease Protonix after biopsy results back  Await pathology results   YOU HAD AN ENDOSCOPIC PROCEDURE TODAY AT [place]:   Refer to the procedure report that was given to you for any specific questions about what was found during the examination.  If the procedure report does not answer your questions, please call your gastroenterologist to clarify.  If you requested that your care partner not be given the details of your procedure findings, then the procedure report has been included in a sealed envelope for you to review at your convenience later.  YOU SHOULD EXPECT: Some feelings of bloating in the abdomen. Passage of more gas than usual.  Walking can help get rid of the air that was put into your GI tract during the procedure and reduce the bloating. If you had a lower endoscopy (such as a colonoscopy or flexible sigmoidoscopy) you may notice spotting of blood in your stool or on the toilet paper. If you underwent a bowel prep for your procedure, you may not have a normal bowel movement for a few days.  Please Note:  You might notice some irritation and congestion in your nose or some drainage.  This is from the oxygen used during your procedure.  There is no need for concern and it should clear up in a day or so.  SYMPTOMS TO REPORT IMMEDIATELY:     Following upper endoscopy (EGD)  Vomiting of blood or coffee ground material  New chest pain or pain under the shoulder blades  Painful or persistently difficult swallowing  New shortness of breath  Fever of 100F or higher  Black, tarry-looking stools  For urgent or emergent issues, a gastroenterologist can be reached at any hour by calling [phone].   DIET:  We do recommend a small meal at first, but then you may proceed to your regular diet.  Drink plenty of fluids but you should avoid alcoholic beverages for 24 hours.  ACTIVITY:  You should plan to take it easy  for the rest of today and you should NOT DRIVE or use heavy machinery until tomorrow (because of the sedation medicines used during the test).    FOLLOW UP: Our staff will call the number listed on your records 48-72 hours following your procedure to check on you and address any questions or concerns that you may have regarding the information given to you following your procedure. If we do not reach you, we will leave a message.  We will attempt to reach you two times.  During this call, we will ask if you have developed any symptoms of COVID 19. If you develop any symptoms (ie: fever, flu-like symptoms, shortness of breath, cough etc.) before then, please call [phone].  If you test positive for Covid 19 in the 2 weeks post procedure, please call and report this information to [place].    If any biopsies were taken you will be contacted by phone or by letter within the next 1-3 weeks.  Please call us at [phone] if you have not heard about the biopsies in 3 weeks.    SIGNATURES/CONFIDENTIALITY: You and/or your care partner have signed paperwork which will be entered into your electronic medical record.  These signatures attest to the fact that that the information above on your After Visit Summary has been reviewed and is understood.  Full responsibility of the confidentiality of this discharge information lies with you and/or your  care-partner.

## 2018-10-19 NOTE — Progress Notes (Signed)
PT taken to PACU. Monitors in place. VSS. Report given to RN. 

## 2018-10-24 ENCOUNTER — Telehealth: Payer: Self-pay

## 2018-10-24 NOTE — Telephone Encounter (Signed)
  Follow up Call-  Call back number 10/19/2018  Post procedure Call Back phone  # [phone]  Permission to leave phone message Yes  Some recent data might be hidden     Patient questions:  Do you have a fever, pain , or abdominal swelling? No. Pain Score  0 *  Have you tolerated food without any problems? Yes.    Have you been able to return to your normal activities? Yes.    Do you have any questions about your discharge instructions: Diet   No. Medications  No. Follow up visit  No.  Do you have questions or concerns about your Care? No.  Actions: * If pain score is 4 or above: 1. No action needed, pain <4.Have you developed a fever since your procedure? no  2.   Have you had an respiratory symptoms (SOB or cough) since your procedure? no  3.   Have you tested positive for COVID 19 since your procedure no  4.   Have you had any family members/close contacts diagnosed with the COVID 19 since your procedure?  no   If yes to any of these questions please route to Joylene John, RN and Alphonsa Gin, Therapist, sports.

## 2018-11-04 ENCOUNTER — Encounter: Payer: Self-pay | Admitting: Gastroenterology

## 2018-12-06 ENCOUNTER — Other Ambulatory Visit: Payer: Self-pay

## 2018-12-06 ENCOUNTER — Ambulatory Visit: Payer: Medicaid Other | Admitting: Emergency Medicine

## 2018-12-06 ENCOUNTER — Ambulatory Visit (INDEPENDENT_AMBULATORY_CARE_PROVIDER_SITE_OTHER): Payer: Managed Care, Other (non HMO) | Admitting: Emergency Medicine

## 2018-12-06 ENCOUNTER — Encounter: Payer: Self-pay | Admitting: Emergency Medicine

## 2018-12-06 VITALS — BP 117/69 | HR 62 | Temp 98.5°F | Resp 16 | Ht 70.0 in | Wt 170.8 lb

## 2018-12-06 DIAGNOSIS — K2930 Chronic superficial gastritis without bleeding: Secondary | ICD-10-CM | POA: Diagnosis not present

## 2018-12-06 DIAGNOSIS — H01004 Unspecified blepharitis left upper eyelid: Secondary | ICD-10-CM | POA: Diagnosis not present

## 2018-12-06 MED ORDER — PANTOPRAZOLE SODIUM 40 MG PO TBEC: 40.0000 mg | DELAYED_RELEASE_TABLET | Freq: Every day | ORAL | 2 refills | Status: DC

## 2018-12-06 MED ORDER — CEFADROXIL 500 MG PO CAPS: 500.0000 mg | ORAL_CAPSULE | Freq: Two times a day (BID) | ORAL | 0 refills | Status: AC

## 2018-12-06 NOTE — Patient Instructions (Addendum)
If you have lab work done today you will be contacted with your lab results within the next 2 weeks.  If you have not heard from [place] then please contact us. The fastest way to get your results is to register for My Chart.   IF you received an x-ray today, you will receive an invoice from [facility]. Please contact [facility] at [phone] with questions or concerns regarding your invoice.   IF you received labwork today, you will receive an invoice from [facility]. Please contact [facility] at [phone] with questions or concerns regarding your invoice.   Our billing staff will not be able to assist you with questions regarding bills from these companies.  You will be contacted with the lab results as soon as they are available. The fastest way to get your results is to activate your My Chart account. Instructions are located on the last page of this paperwork. If you have not heard from [place] regarding the results in 2 weeks, please contact this office.    Diet for gastritis Diet and Dental Disease What you eat affects the health of your teeth. In addition to good oral hygiene, choosing the right foods can help to keep your teeth healthy and free from dental problems such as tooth decay and gum disease. It is important to know which foods can help to keep your teeth strong and which foods can cause problems for your teeth. If you do not get the proper nutrients in your diet, your body may be less able to prevent dental disease. What general guidelines do I need to follow? Food guidelines      Eat a healthy, well-balanced diet that includes a variety of foods. Your diet should include: ? Fiber-rich fruits and vegetables. ? Lean sources of protein. These include eggs, lean meat, chicken, fish, and low-fat or fat-free dairy products. ? Whole grains. These include brown rice, whole-wheat bread, and pasta. ? Fresh, whole, and unprocessed foods. These foods help you  produce more saliva, which is important for good dental health.  Try to wait at least 2 hours between meals, snacks, and drinks.  Avoid foods that contain high amounts of sugar or easily digested (refined) simple carbohydrates, such as candies, cakes, cookies, and syrups. Eating these foods often over a long period of time can increase your risk of developing cavities (dental caries).  Avoid eating sticky or acidic foods by themselves, such as caramel, dried fruit, jams, or citrus fruits. If you eat these types of foods, eat them with meals rather than between meals.  Rinse your mouth with water after eating sugary snacks.  Brush and floss your teeth after meals and snacks if possible.  Chew sugar-free gum right after a meal or snack if you cannot brush your teeth. Beverage guidelines  Avoid sipping drinks that are sweetened with sugar, such as soda. Sipping these drinks for prolonged periods can increase your risk of cavities.  Avoid holding or swishing acidic or sugary drinks in your mouth.  Keep yourself hydrated by drinking adequate amounts of water. Drink enough fluid to keep your urine clear or pale yellow. Recommended foods  Foods that are high in vitamins C and A, such as fresh fruits and vegetables. These vitamins are important for keeping gums healthy and for building tooth enamel.  High-quality protein foods. These include lean meats, eggs, cheese, milk, yogurt, fish, beans, and legumes.  Whole-grain breads and cereals that are low in sugar.  Sugar-free chewing gum  and sugar-free mints.  Foods that are good sources of calcium. These include cheese, milk, plain yogurt, calcium-fortified tofu, leafy greens, and almonds.  Water, especially fluoridated water. The items listed above may not be a complete list of recommended foods and beverages. Contact a dietitian for more options. Foods to avoid  Any food or drink that contains sugar or is sweetened with sugar. These should  be avoided in general or only consumed in moderation. This list includes fruit drinks, carbonated beverages, sport or energy drinks, as well as sweetened coffees and teas.  Sticky foods. These include raisins and other dried fruits, and candies that are sticky, hard, or slow to melt. It is best to avoid these.  Foods that are high in refined carbohydrates or higher in sugar. These include cookies, cakes, muffins, chips, and crackers.  Acidic foods and drinks. These include tomatoes, citrus fruits, coffee, and fruit juice. These foods and drinks can weaken tooth enamel, which can cause tooth sensitivity, erosion, and pitting. The items listed above may not be a complete list of foods and beverages to avoid. Contact a dietitian for more information. Summary  Choosing the right foods can help to keep your teeth healthy and free from dental problems such as tooth decay and gum disease.  Recommended foods include fresh fruits and vegetables, high-quality protein, whole-grain breads and cereals, and foods that are high in calcium.  Avoid foods or drinks that are high in refined carbohydrates or sugar. Avoid acidic foods and drinks. This information is not intended to replace advice given to you by your health care provider. Make sure you discuss any questions you have with your health care provider. Document Released: 09/30/2010 Document Revised: 02/01/2017 Document Reviewed: 02/01/2017 Elsevier Patient Education  2020 Elsevier Inc.  Temple-InlandBlefaritis Blepharitis La blefaritis es la hinchazn de los prpados. Entre los sntomas, se pueden incluir los siguientes:  Piel enrojecida y escamosa alrededor del cuero cabelludo y las cejas.  Ardor o picazn en los prpados.  Lquido que sale de los ojos por la noche. Esto hace que las pestaas estn pegadas por la [place].  Cada de las pestaas.  Sensibilidad a Statisticianla luz. Sigue estas indicaciones en tu casa: Est atento a cualquier cambio en su aspecto o en  cmo se siente. Informe al mdico si nota algn cambio. Estas indicaciones pueden ayudarlo con la afeccin: Mantener la zona limpia   Lvese las manos con frecuencia.  Lvese los prpados con agua tibia sola o agua tibia mezclada con una pequea cantidad de champ para beb. Hgalo 2o msveces al C.H. Robinson Worldwideda.  Lvese el rostro y las cejas por lo menos una vez al da.  Use una toalla limpia cada vez que se seque los prpados. No use la toalla para limpiar o secar otras zonas del cuerpo. No comparta la toalla con nadie. Indicaciones generales  No use maquillaje hasta que est mejor. No comparta el maquillaje con nadie.  Evite frotarse los ojos.  Pngase una compresa tibia en los ojos 2veces al da durante 10minutos cada vez, o como se lo haya indicado el mdico.  Si le recetaron antibiticos en forma de cremas o gotas oftlmicas, selos como se lo haya indicado el mdico. No deje de usar los medicamentos aunque comience a Actorsentirse mejor.  Concurra a todas las visitas de [address] como se lo haya indicado el mdico. Esto es importante. Comunquese con un mdico si:  Siente los prpados calientes.  Tiene ampollas en los prpados.  Tiene una erupcin cutnea en los  prpados.  La inflamacin no desaparece en el trmino de 2 a 4das.  La inflamacin empeora. Solicite ayuda inmediatamente si:  Su dolor empeora.  Siente un dolor que se extiende a Airline pilot del rostro.  Tiene enrojecimiento, y este sntoma empeora.  Tiene enrojecimiento que se extiende a Airline pilot del rostro.  Presenta cambios en la visin.  Siente dolor al [employer] la luz o al mirar cosas que se [place].  Cristy Hilts. Resumen  La blefaritis es la hinchazn de los prpados.  Preste atencin a cualquier cambio en el aspecto de sus ojos o en cmo se sienten. Informe al mdico si hay algn cambio.  Siga las indicaciones del mdico para el cuidado Financial planner. Lvese las manos con frecuencia. Evite el uso de  [place]. No se frote los ojos.  Utilice compresas tibias, cremas o gotas oftlmicas como se lo haya indicado el mdico.  Infrmele al mdico si tiene cambios en la visin, ampollas o erupcin cutnea en los prpados, dolor que se extiende al rostro o calor en los prpados. Esta informacin no tiene Marine scientist el consejo del mdico. Asegrese de hacerle al mdico cualquier pregunta que tenga. Document Released: 04/30/2008 Document Revised: 09/13/2017 Document Reviewed: 09/13/2017 Elsevier Patient Education  Garrett Miller.  Gastritis - Adultos Gastritis, Adult  La gastritis es una hinchazn (inflamacin) del estmago. La gastritis puede desarrollarse rpidamente (aguda). Tambin puede desarrollarse lentamente con el transcurso del tiempo (crnica). Es importante recibir ayuda para Personnel officer. Si no obtiene [place], Nurse, children's y Chief Executive Officer (lceras) en el [employer]. Cules son las causas? Esta afeccin puede ser causada por lo siguiente:  Grmenes que llegan al [employer].  Beber alcohol en exceso.  Los medicamentos que toma.  Demasiado cido en el estmago.  Una enfermedad del intestino o del estmago.  Estrs.  Una reaccin alrgica.  Enfermedad de Crohn.  Algunos tratamientos para el cncer (radiacin). A veces, se desconoce la causa de esta afeccin. Cules son los signos o los sntomas? Los sntomas de esta afeccin incluyen los siguientes:  Research scientist (life sciences).  Una sensacin de Science writer.  Ganas de vomitar (nuseas).  Vmitos.  Sensacin de estar demasiado lleno luego de comer.  Prdida de peso.  Mal aliento.  Vomitar sangre.  Sangre en las heces. Cmo se diagnostica? Esta afeccin puede diagnosticarse en funcin de lo siguiente:  Los antecedentes mdicos y sntomas.  Un examen fsico.  Estudios. Estos pueden incluir los siguientes: ? Anlisis de [place]. ? Pruebas de heces. ? Un  procedimiento para observar el interior del estmago (endoscopa alta). ? Un estudio en el cual se toma una muestra de tejido para analizarlo (biopsia). Cmo se trata? El tratamiento de esta afeccin depende de la causa. Es posible que le administren lo siguiente:  Antibiticos si la afeccin es causada por grmenes.  Bloqueadores H2 y medicamentos similares, si la afeccin fue causada por una gran cantidad de cido. Siga estas indicaciones en su casa: Medicamentos  [employer] de venta libre y los recetados solamente como se lo haya indicado el mdico.  Si le recetaron un antibitico, tmelo como se lo haya indicado el mdico. No deje de tomarlo aunque comience a sentirse mejor. Comida y bebida   Haga comidas pequeas y frecuentes en lugar de comidas abundantes.  Evite los alimentos y las bebidas que intensifican los sntomas.  Beba suficiente lquido para Consulting civil engineer orina de color amarillo plido. Consumo de alcohol  No  beba alcohol si: ? El mdico le indica que no lo haga. ? Est embarazada, puede estar embarazada o est tratando de quedar embarazada.  Si bebe alcohol: ? Limite su uso a las siguientes medidas:  De 0 a 1 medida por da para las mujeres.  De 0 a 2 medidas por da para los hombres. ? Est atento a la cantidad de alcohol que hay en las bebidas que toma. En los [place], una medida equivale a una botella de cerveza de 12oz ( ), un vaso de vino de 5oz ( ) o un vaso de una bebida alcohlica de alta graduacin de 1oz (44ml). Indicaciones generales  Converse con el mdico sobre maneras de Charity fundraiser. Puede realizar ejercicios de respiracin profunda, meditacin o yoga.  No fume ni consuma productos que contengan nicotina ni tabaco. Si necesita ayuda para dejar de fumar, consulte al mdico.  Concurra a todas las visitas de seguimiento como se lo haya indicado el mdico. Esto es importante. Comunquese con un mdico  si:  Sus sntomas empeoran.  Los sntomas desaparecen y Stage manager. Solicite ayuda inmediatamente si:  Vomita sangre o una sustancia parecida a los granos de [place].  La materia fecal es negra o de color rojo oscuro.  Vomita cada vez que intenta tomar lquidos.  El dolor de [place].  Tiene fiebre.  No se siente mejor luego de [employer]. Resumen  La gastritis es una hinchazn (inflamacin) del estmago.  Debe obtener ayuda para tratar esta afeccin. Si no obtiene [place], el [address] y pueden formarse llagas (lceras).  Esta afeccin se diagnostica mediante los antecedentes mdicos, un examen fsico o estudios.  Puede recibir tratamiento con medicamentos para los grmenes o medicamentos para bloquear la presencia de demasiada cantidad de cido Higher education careers adviser. Esta informacin no tiene Theme park manager el consejo del mdico. Asegrese de hacerle al mdico cualquier pregunta que tenga. Document Released: 08/03/2011 Document Revised: 08/11/2017 Document Reviewed: 08/11/2017 Elsevier Patient Education  2020 [employer].

## 2018-12-06 NOTE — Progress Notes (Signed)
Braxden Lovering 33 y.o.   Chief Complaint  Patient presents with  . Abdominal Pain    3 months follow up    HISTORY OF PRESENT ILLNESS: This is a 33 y.o. male with history of chronic epigastric pain.  Seen by me on 10/05/2018 and started on pantoprazole on Pepcid.  Referred to GI.  Had upper endoscopy on 10/19/2018 that showed diffuse chronic gastritis.  Surgical pathology negative for malignancy and negative for H. pylori.  H. pylori serology negative as well.  Doing better but still having occasional epigastric pain. Upper endoscopy results and surgical pathology reports reviewed. Patient states that prior to worsening of symptoms he had been taking frequent amounts of NSAIDs.  Advised to stop all NSAIDs and only take Tylenol as needed for any type of pain. Has modified diet and thinks it has helped. No new symptoms or medical concerns today. Also complaining of redness swelling of left upper eyelid for the past 2 days.  HPI   Prior to Admission medications   Medication Sig Start Date End Date Taking? Authorizing Provider  lisinopril (ZESTRIL) 10 MG tablet TAKE 1 TABLET(10 MG) BY MOUTH DAILY 08/31/18  Yes Ceejay Kegley, Eilleen Kempf, MD  famotidine (PEPCID) 40 MG tablet Take 1 tablet (40 mg total) by mouth daily. 10/05/18 11/04/18  Georgina Quint, MD  pantoprazole (PROTONIX) 40 MG tablet TK 1 T PO QD 08/27/18   [provider]    No Known Allergies  Patient Active Problem List   Diagnosis Date Noted  . Essential hypertension 02/10/2015  . HTN (hypertension) 08/22/2013    Past Medical History:  Diagnosis Date  . Hypertension     Past Surgical History:  Procedure Laterality Date  . CHOLECYSTECTOMY  2013  . LAPAROSCOPIC APPENDECTOMY N/A 06/24/2015   Procedure: APPENDECTOMY LAPAROSCOPIC;  Surgeon: De Blanch Kinsinger, MD;  Location: [facility] OR;  Service: General;  Laterality: N/A;    Social History   Socioeconomic History  . Marital status: Single    Spouse name: Not on  file  . Number of children: 2  . Years of education: Not on file  . Highest education level: Not on file  Occupational History  . Occupation: Camera operator  . Financial resource strain: Not on file  . Food insecurity    Worry: Not on file    Inability: Not on file  . Transportation needs    Medical: Not on file    Non-medical: Not on file  Tobacco Use  . Smoking status: Never Smoker  . Smokeless tobacco: Never Used  Substance and Sexual Activity  . Alcohol use: Yes    Alcohol/week: 0.0 standard drinks    Comment: Ociassionally/ A beer once in a while   . Drug use: No  . Sexual activity: Yes  Lifestyle  . Physical activity    Days per week: Not on file    Minutes per session: Not on file  . Stress: Not on file  Relationships  . Social Musician on phone: Not on file    Gets together: Not on file    Attends religious service: Not on file    Active member of club or organization: Not on file    Attends meetings of clubs or organizations: Not on file    Relationship status: Not on file  . Intimate partner violence    Fear of current or ex partner: Not on file    Emotionally abused: Not on file  Physically abused: Not on file    Forced sexual activity: Not on file  Other Topics Concern  . Not on file  Social History Narrative   Marital status: single; dating seriously x 10 years.  From [place] since [place] years.      Children: one child.      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    Family History  Problem Relation Age of Onset  . Hypertension Mother   . Colon cancer Neg Hx   . Esophageal cancer Neg Hx      Review of Systems  Constitutional: Negative for chills and fever.  HENT: Negative.  Negative for congestion and sore throat.   Respiratory: Negative.  Negative for cough and shortness of breath.   Cardiovascular: Negative.  Negative for chest pain and palpitations.  Gastrointestinal: Positive for abdominal pain and heartburn. Negative for blood in  stool, diarrhea, melena, nausea and vomiting.  Genitourinary: Negative.  Negative for dysuria and hematuria.  Musculoskeletal: Negative.  Negative for myalgias.  Skin: Negative.  Negative for rash.  Neurological: Negative for dizziness and headaches.  All other systems reviewed and are negative.    Today's Vitals   12/06/18 1638  BP: 117/69  Pulse: 62  Resp: 16  Temp: 98.5 F (36.9 C)  TempSrc: Oral  SpO2: 98%  Weight: 170 lb 12.8 oz (77.5 kg)  Height: 5\' 10"  (1.778 m)   Body mass index is 24.51 kg/m.   Physical Exam Vitals signs reviewed.  Constitutional:      Appearance: Normal appearance.  HENT:     Head: Normocephalic.  Eyes:     Extraocular Movements: Extraocular movements intact.     Conjunctiva/sclera: Conjunctivae normal.     Pupils: Pupils are equal, round, and reactive to light.     Comments: Left upper eyelid: Positive erythema with edema, tender to touch.  Neck:     Musculoskeletal: Normal range of motion.  Cardiovascular:     Rate and Rhythm: Normal rate and regular rhythm.     Heart sounds: Normal heart sounds.  Pulmonary:     Effort: Pulmonary effort is normal.     Breath sounds: Normal breath sounds.  Abdominal:     General: There is no distension.     Palpations: Abdomen is soft.     Tenderness: There is no abdominal tenderness.  Musculoskeletal: Normal range of motion.  Skin:    General: Skin is warm and dry.  Neurological:     General: No focal deficit present.     Mental Status: He is alert and oriented to person, place, and time.  Psychiatric:        Mood and Affect: Mood normal.        Behavior: Behavior normal.      ASSESSMENT & PLAN: [name] was seen today for abdominal pain.  Diagnoses and all orders for this visit:  Chronic superficial gastritis without bleeding -     pantoprazole (PROTONIX) 40 MG tablet; Take 1 tablet (40 mg total) by mouth daily.  Blepharitis of left upper eyelid, unspecified type -     cefadroxil  (DURICEF) 500 MG capsule; Take 1 capsule (500 mg total) by mouth 2 (two) times daily for 7 days.  Advised to take only Tylenol as needed for any type of pain.  Avoid all kinds of NSAIDs and aspirin.  Patient Instructions       If you have lab work done today you will be contacted with your lab results within the next 2 weeks.  If you have not heard from [place] then please contact us. The fastest way to get your results is to register for My Chart.   IF you received an x-ray today, you will receive an invoice from [facility]. Please contact [facility] at [phone] with questions or concerns regarding your invoice.   IF you received labwork today, you will receive an invoice from [facility]. Please contact [facility] at [phone] with questions or concerns regarding your invoice.   Our billing staff will not be able to assist you with questions regarding bills from these companies.  You will be contacted with the lab results as soon as they are available. The fastest way to get your results is to activate your My Chart account. Instructions are located on the last page of this paperwork. If you have not heard from [place] regarding the results in 2 weeks, please contact this office.    Diet for gastritis Diet and Dental Disease What you eat affects the health of your teeth. In addition to good oral hygiene, choosing the right foods can help to keep your teeth healthy and free from dental problems such as tooth decay and gum disease. It is important to know which foods can help to keep your teeth strong and which foods can cause problems for your teeth. If you do not get the proper nutrients in your diet, your body may be less able to prevent dental disease. What general guidelines do I need to follow? Food guidelines      Eat a healthy, well-balanced diet that includes a variety of foods. Your diet should include: ? Fiber-rich fruits and vegetables. ? Lean sources of  protein. These include eggs, lean meat, chicken, fish, and low-fat or fat-free dairy products. ? Whole grains. These include brown rice, whole-wheat bread, and pasta. ? Fresh, whole, and unprocessed foods. These foods help you produce more saliva, which is important for good dental health.  Try to wait at least 2 hours between meals, snacks, and drinks.  Avoid foods that contain high amounts of sugar or easily digested (refined) simple carbohydrates, such as candies, cakes, cookies, and syrups. Eating these foods often over a long period of time can increase your risk of developing cavities (dental caries).  Avoid eating sticky or acidic foods by themselves, such as caramel, dried fruit, jams, or citrus fruits. If you eat these types of foods, eat them with meals rather than between meals.  Rinse your mouth with water after eating sugary snacks.  Brush and floss your teeth after meals and snacks if possible.  Chew sugar-free gum right after a meal or snack if you cannot brush your teeth. Beverage guidelines  Avoid sipping drinks that are sweetened with sugar, such as soda. Sipping these drinks for prolonged periods can increase your risk of cavities.  Avoid holding or swishing acidic or sugary drinks in your mouth.  Keep yourself hydrated by drinking adequate amounts of water. Drink enough fluid to keep your urine clear or pale yellow. Recommended foods  Foods that are high in vitamins C and A, such as fresh fruits and vegetables. These vitamins are important for keeping gums healthy and for building tooth enamel.  High-quality protein foods. These include lean meats, eggs, cheese, milk, yogurt, fish, beans, and legumes.  Whole-grain breads and cereals that are low in sugar.  Sugar-free chewing gum and sugar-free mints.  Foods that are good sources of calcium. These include cheese, milk, plain yogurt, calcium-fortified tofu, leafy greens, and almonds.  Water, especially fluoridated  water. The items listed above may not be a complete list of recommended foods and beverages. Contact a dietitian for more options. Foods to avoid  Any food or drink that contains sugar or is sweetened with sugar. These should be avoided in general or only consumed in moderation. This list includes fruit drinks, carbonated beverages, sport or energy drinks, as well as sweetened coffees and teas.  Sticky foods. These include raisins and other dried fruits, and candies that are sticky, hard, or slow to melt. It is best to avoid these.  Foods that are high in refined carbohydrates or higher in sugar. These include cookies, cakes, muffins, chips, and crackers.  Acidic foods and drinks. These include tomatoes, citrus fruits, coffee, and fruit juice. These foods and drinks can weaken tooth enamel, which can cause tooth sensitivity, erosion, and pitting. The items listed above may not be a complete list of foods and beverages to avoid. Contact a dietitian for more information. Summary  Choosing the right foods can help to keep your teeth healthy and free from dental problems such as tooth decay and gum disease.  Recommended foods include fresh fruits and vegetables, high-quality protein, whole-grain breads and cereals, and foods that are high in calcium.  Avoid foods or drinks that are high in refined carbohydrates or sugar. Avoid acidic foods and drinks. This information is not intended to replace advice given to you by your health care provider. Make sure you discuss any questions you have with your health care provider. Document Released: 09/30/2010 Document Revised: 02/01/2017 Document Reviewed: 02/01/2017 Elsevier Patient Education  2020 Elsevier Inc.  [place] Blepharitis La blefaritis es la hinchazn de los prpados. Entre los sntomas, se pueden incluir los siguientes:  Piel enrojecida y escamosa alrededor del cuero cabelludo y las cejas.  Ardor o picazn en los prpados.  Lquido  que sale de los ojos por la noche. Esto hace que las pestaas estn pegadas por la [place].  Cada de las pestaas.  Sensibilidad a Statistician. Sigue estas indicaciones en tu casa: Est atento a cualquier cambio en su aspecto o en cmo se siente. Informe al mdico si nota algn cambio. Estas indicaciones pueden ayudarlo con la afeccin: Mantener la zona limpia   Lvese las manos con frecuencia.  Lvese los prpados con agua tibia sola o agua tibia mezclada con una pequea cantidad de champ para beb. Hgalo 2o msveces al [employer].  Lvese el rostro y las cejas por lo menos una vez al da.  Use una toalla limpia cada vez que se seque los prpados. No use la toalla para limpiar o secar otras zonas del cuerpo. No comparta la toalla con nadie. Indicaciones generales  No use maquillaje hasta que est mejor. No comparta el maquillaje con nadie.  Evite frotarse los ojos.  Pngase una compresa tibia en los ojos 2veces al da durante cada vez, o como se lo haya indicado el mdico.  Si le recetaron antibiticos en forma de cremas o gotas oftlmicas, selos como se lo haya indicado el mdico. No deje de usar los medicamentos aunque comience a Actor.  Concurra a todas las visitas de [address] se lo haya indicado el mdico. Esto es importante. Comunquese con un mdico si:  Siente los prpados calientes.  Tiene ampollas en los prpados.  Tiene una erupcin cutnea en los prpados.  La inflamacin no desaparece en el trmino de 2 a 4das.  La inflamacin empeora. Solicite ayuda inmediatamente si:  Su dolor  empeora.  Siente un dolor que se extiende a Corporate treasurer del rostro.  Tiene enrojecimiento, y este sntoma [address].  Tiene enrojecimiento que se extiende a Corporate treasurer del rostro.  Presenta cambios en la visin.  Siente dolor al [employer] la luz o al mirar cosas que se [place].  Grant Ruts. Resumen  La blefaritis es la hinchazn de los prpados.   Preste atencin a cualquier cambio en el aspecto de sus ojos o en cmo se sienten. Informe al mdico si hay algn cambio.  Siga las indicaciones del mdico para el cuidado Facilities manager. Lvese las manos con frecuencia. Evite el uso de [place]. No se frote los ojos.  Utilice compresas tibias, cremas o gotas oftlmicas como se lo haya indicado el mdico.  Infrmele al mdico si tiene cambios en la visin, ampollas o erupcin cutnea en los prpados, dolor que se extiende al rostro o calor en los prpados. Esta informacin no tiene Theme park manager el consejo del mdico. Asegrese de hacerle al mdico cualquier pregunta que tenga. Document Released: 04/30/2008 Document Revised: 09/13/2017 Document Reviewed: 09/13/2017 Elsevier Patient Education  2020 Elsevier Inc.  Gastritis - Adultos Gastritis, Adult  La gastritis es una hinchazn (inflamacin) del estmago. La gastritis puede desarrollarse rpidamente (aguda). Tambin puede desarrollarse lentamente con el transcurso del tiempo (crnica). Es importante recibir ayuda para Copy. Si no obtiene [place], Radiographer, therapeutic y Water engineer (lceras) en el [employer]. Cules son las causas? Esta afeccin puede ser causada por lo siguiente:  Grmenes que llegan al [employer].  Beber alcohol en exceso.  Los medicamentos que toma.  Demasiado cido en el estmago.  Una enfermedad del intestino o del estmago.  Estrs.  Una reaccin alrgica.  Enfermedad de Crohn.  Algunos tratamientos para el cncer (radiacin). A veces, se desconoce la causa de esta afeccin. Cules son los signos o los sntomas? Los sntomas de esta afeccin incluyen los siguientes:  Physiological scientist.  Una sensacin de Surveyor, minerals.  Ganas de vomitar (nuseas).  Vmitos.  Sensacin de estar demasiado lleno luego de comer.  Prdida de peso.  Mal aliento.  Vomitar sangre.  Sangre en las heces. Cmo se diagnostica?  Esta afeccin puede diagnosticarse en funcin de lo siguiente:  Los antecedentes mdicos y sntomas.  Un examen fsico.  Estudios. Estos pueden incluir los siguientes: ? Anlisis de [place]. ? Pruebas de heces. ? Un procedimiento para observar el interior del estmago (endoscopa alta). ? Un estudio en el cual se toma una muestra de tejido para analizarlo (biopsia). Cmo se trata? El tratamiento de esta afeccin depende de la causa. Es posible que le administren lo siguiente:  Antibiticos si la afeccin es causada por grmenes.  Bloqueadores H2 y medicamentos similares, si la afeccin fue causada por una gran cantidad de cido. Siga estas indicaciones en su casa: Medicamentos  [employer] de venta libre y los recetados solamente como se lo haya indicado el mdico.  Si le recetaron un antibitico, tmelo como se lo haya indicado el mdico. No deje de tomarlo aunque comience a sentirse mejor. Comida y bebida   Haga comidas pequeas y frecuentes en lugar de comidas abundantes.  Evite los alimentos y las bebidas que intensifican los sntomas.  Beba suficiente lquido para Photographer orina de color amarillo plido. Consumo de alcohol  No beba alcohol si: ? El mdico le indica que no lo haga. ? Est embarazada, puede estar embarazada o est tratando de quedar embarazada.  Si bebe alcohol: ? Limite su uso a las siguientes medidas:  De 0 a 1 medida por da para las mujeres.  De 0 a 2 medidas por da para los hombres. ? Est atento a la cantidad de alcohol que hay en las bebidas que toma. En los [place], una medida equivale a una botella de cerveza de 12oz ( ), un vaso de vino de 5oz ( ) o un vaso de una bebida alcohlica de alta graduacin de 1oz (44ml). Indicaciones generales  Converse con el mdico sobre maneras de Charity fundraiser. Puede realizar ejercicios de respiracin profunda, meditacin o yoga.  No fume ni consuma productos que  contengan nicotina ni tabaco. Si necesita ayuda para dejar de fumar, consulte al mdico.  Concurra a todas las visitas de seguimiento como se lo haya indicado el mdico. Esto es importante. Comunquese con un mdico si:  Sus sntomas empeoran.  Los sntomas desaparecen y Stage manager. Solicite ayuda inmediatamente si:  Vomita sangre o una sustancia parecida a los granos de [place].  La materia fecal es negra o de color rojo oscuro.  Vomita cada vez que intenta tomar lquidos.  El dolor de [place].  Tiene fiebre.  No se siente mejor luego de [employer]. Resumen  La gastritis es una hinchazn (inflamacin) del estmago.  Debe obtener ayuda para tratar esta afeccin. Si no obtiene [place], el [address] y pueden formarse llagas (lceras).  Esta afeccin se diagnostica mediante los antecedentes mdicos, un examen fsico o estudios.  Puede recibir tratamiento con medicamentos para los grmenes o medicamentos para bloquear la presencia de demasiada cantidad de cido Higher education careers adviser. Esta informacin no tiene Theme park manager el consejo del mdico. Asegrese de hacerle al mdico cualquier pregunta que tenga. Document Released: 08/03/2011 Document Revised: 08/11/2017 Document Reviewed: 08/11/2017 Elsevier Patient Education  2020 Elsevier Inc.      Edwina Barth, MD [facility]

## 2018-12-13 ENCOUNTER — Telehealth: Payer: Self-pay | Admitting: Gastroenterology

## 2018-12-13 DIAGNOSIS — K2990 Gastroduodenitis, unspecified, without bleeding: Secondary | ICD-10-CM

## 2018-12-13 DIAGNOSIS — K2970 Gastritis, unspecified, without bleeding: Secondary | ICD-10-CM

## 2018-12-13 DIAGNOSIS — R1013 Epigastric pain: Secondary | ICD-10-CM

## 2018-12-13 DIAGNOSIS — R1011 Right upper quadrant pain: Secondary | ICD-10-CM

## 2018-12-13 NOTE — Telephone Encounter (Signed)
Please advise on previous messages

## 2018-12-13 NOTE — Telephone Encounter (Signed)
Will you please call the patient and get more information concerning his symptoms -including length of time symptoms have been going on, has he taken any over the counter medications to help with relief, where the pain is (if there is any); any additional information he is willing to provide Thank you

## 2018-12-13 NOTE — Telephone Encounter (Signed)
Spoke with patient states he has been having upper abdominal pain which has not gotten better since his initial office visit with [place]. Patient states he has been taking Pantoprazole with no relief has not tried anything else OTC. Patient is asking for next step to help with this. Best call back # [phone].

## 2018-12-14 NOTE — Telephone Encounter (Signed)
Instructions and contrast have been placed at front desk for patient pick up; labs have been entered into Epic;

## 2018-12-14 NOTE — Telephone Encounter (Signed)
-   Trial course of FDGuard - CT Abd/Pelvis with PO/IV contrast to evaluate for extraluminal etiology for ongoing abdominal  Pain - Can f/u in the clinic as you recommended to him. Can schedule for after CT so we can review results together

## 2018-12-14 NOTE — Telephone Encounter (Signed)
Spoke with patient he has CT Abd/Pelvis scheduled at [facility] 12/22/2018 at 9am and follow up visit with Dr.Cirigliano 12/25/2018 at 8:40am. Per Hoyle Sauer at [facility] CT patient needs Creatnine labs done prior to CT on 12/22/2018. Please put lab orders in for patient. I notified patient he can come to [place] to have labs drawn tomorrow and pick up contrast and instructions. Please have this ready for patient to pick up tomorrow at [facility] 12/15/2018. Pt verbalized understanding.

## 2018-12-14 NOTE — Telephone Encounter (Signed)
Garrett Miller, can you please call this patient and notify the patient of the MD recommendations;

## 2018-12-15 ENCOUNTER — Other Ambulatory Visit (INDEPENDENT_AMBULATORY_CARE_PROVIDER_SITE_OTHER): Payer: Managed Care, Other (non HMO)

## 2018-12-15 DIAGNOSIS — K2990 Gastroduodenitis, unspecified, without bleeding: Secondary | ICD-10-CM | POA: Diagnosis not present

## 2018-12-15 DIAGNOSIS — R1011 Right upper quadrant pain: Secondary | ICD-10-CM

## 2018-12-15 DIAGNOSIS — R1013 Epigastric pain: Secondary | ICD-10-CM

## 2018-12-15 DIAGNOSIS — K2970 Gastritis, unspecified, without bleeding: Secondary | ICD-10-CM | POA: Diagnosis not present

## 2018-12-15 LAB — BUN: BUN: 17 mg/dL (ref 6–23)

## 2018-12-15 LAB — CREATININE, SERUM: Creatinine, Ser: 0.9 mg/dL (ref 0.40–1.50)

## 2018-12-20 ENCOUNTER — Other Ambulatory Visit (HOSPITAL_BASED_OUTPATIENT_CLINIC_OR_DEPARTMENT_OTHER): Payer: Medicaid Other

## 2018-12-22 ENCOUNTER — Telehealth: Payer: Self-pay | Admitting: Gastroenterology

## 2018-12-22 ENCOUNTER — Ambulatory Visit (HOSPITAL_BASED_OUTPATIENT_CLINIC_OR_DEPARTMENT_OTHER): Payer: Managed Care, Other (non HMO)

## 2018-12-22 NOTE — Telephone Encounter (Signed)
The CT is being ordered for epigastric pain which has already been evaluated endoscopically. For epigastric pain, the CT is superior to abdominal ultrasound. Would prefer a CT. Please let me know if again denied, and will then go with abdominal ultrasound despite inferiority for goals of this imaging. Thank you.

## 2018-12-22 NOTE — Telephone Encounter (Signed)
Patient's healthcare insurance Garrett Miller has denied CT ABD/Pelvis  "Based on [employer]Core Abdomen Imaging Guidelines Section(s): AB 2.3 Right Upper Quadrant Pain including Suspected Gallbladder Disease, we cannot approve this request. Your records show that you have pain in the right upper region of your belly (abdomen). The reason this request cannot be approved is because: 1. Imaging may be supported if results of a picture study using sound waves (ultrasound or [place]) of your belly (abdomen) did not explain the problem or showed limited views. Your records do not describe these results."  Garrett Miller is requesting patient have an [place] first. Please review and advise how you would like for [place] to proceed.   Thank you, Lorriane Shire

## 2018-12-25 ENCOUNTER — Ambulatory Visit: Payer: Medicaid Other | Admitting: Gastroenterology

## 2018-12-28 NOTE — Telephone Encounter (Signed)
Dr.Cirigliano completed peer to peer. Authorization # [id number] valid 12/19/2018-06/17/2019. Spoke with patient to notify him CT has been approved to reschedule, but patient states he will need to call us back to reschedule due to his work schedule.

## 2019-02-13 ENCOUNTER — Other Ambulatory Visit (HOSPITAL_COMMUNITY)
Admission: RE | Admit: 2019-02-13 | Discharge: 2019-02-13 | Disposition: A | Discharge status: Home / Self Care | Payer: Managed Care, Other (non HMO) | Source: Ambulatory Visit | Attending: Emergency Medicine | Admitting: Emergency Medicine

## 2019-02-13 ENCOUNTER — Encounter: Payer: Self-pay | Admitting: Emergency Medicine

## 2019-02-13 ENCOUNTER — Ambulatory Visit: Payer: Managed Care, Other (non HMO) | Admitting: Emergency Medicine

## 2019-02-13 ENCOUNTER — Ambulatory Visit (INDEPENDENT_AMBULATORY_CARE_PROVIDER_SITE_OTHER): Payer: Managed Care, Other (non HMO) | Admitting: Emergency Medicine

## 2019-02-13 ENCOUNTER — Other Ambulatory Visit: Payer: Self-pay

## 2019-02-13 VITALS — BP 117/68 | HR 73 | Temp 98.6°F | Resp 16 | Ht 71.0 in | Wt 175.0 lb

## 2019-02-13 DIAGNOSIS — R300 Dysuria: Secondary | ICD-10-CM

## 2019-02-13 DIAGNOSIS — R350 Frequency of micturition: Secondary | ICD-10-CM

## 2019-02-13 DIAGNOSIS — M549 Dorsalgia, unspecified: Secondary | ICD-10-CM | POA: Diagnosis not present

## 2019-02-13 LAB — POCT URINALYSIS DIP (MANUAL ENTRY)
Bilirubin, UA: NEGATIVE
Blood, UA: NEGATIVE
Glucose, UA: NEGATIVE mg/dL
Ketones, POC UA: NEGATIVE mg/dL
Leukocytes, UA: NEGATIVE
Nitrite, UA: NEGATIVE
Protein Ur, POC: NEGATIVE mg/dL
Spec Grav, UA: 1.025 (ref 1.010–1.025)
Urobilinogen, UA: 0.2 E.U./dL
pH, UA: 7 (ref 5.0–8.0)

## 2019-02-13 MED ORDER — CEFADROXIL 500 MG PO CAPS: 500.0000 mg | ORAL_CAPSULE | Freq: Two times a day (BID) | ORAL | 0 refills | Status: AC

## 2019-02-13 MED ORDER — CYCLOBENZAPRINE HCL 10 MG PO TABS: 10.0000 mg | ORAL_TABLET | Freq: Every day | ORAL | 0 refills | Status: DC

## 2019-02-13 NOTE — Progress Notes (Signed)
Garrett Miller 33 y.o.   Chief Complaint  Patient presents with  . Back Pain    LOWER x 3 weeks  . Urinary Frequency    with burning to urinate -  a little    HISTORY OF PRESENT ILLNESS: This is a 33 y.o. male complaining of low back pain for 3 to 4 weeks followed by urinary frequency and burning that started 2 days ago.  No other significant symptoms.  No significant chronic medical history except for hypertension. Denies injuries.  HPI   Prior to Admission medications   Medication Sig Start Date End Date Taking? Authorizing Provider  lisinopril (ZESTRIL) 10 MG tablet TAKE 1 TABLET(10 MG) BY MOUTH DAILY 08/31/18  Yes Adelheid Hoggard, Eilleen KempfMiguel Jose, MD  famotidine (PEPCID) 40 MG tablet Take 1 tablet (40 mg total) by mouth daily. 10/05/18 11/04/18  Georgina QuintSagardia, Joshoa Shawler Jose, MD  pantoprazole (PROTONIX) 40 MG tablet Take 1 tablet (40 mg total) by mouth daily. 12/06/18 01/05/19  Georgina QuintSagardia, Tally Mckinnon Jose, MD    No Known Allergies  Patient Active Problem List   Diagnosis Date Noted  . Chronic superficial gastritis without bleeding 12/06/2018  . Essential hypertension 02/10/2015  . HTN (hypertension) 08/22/2013    Past Medical History:  Diagnosis Date  . Hypertension     Past Surgical History:  Procedure Laterality Date  . CHOLECYSTECTOMY  2013  . LAPAROSCOPIC APPENDECTOMY N/A 06/24/2015   Procedure: APPENDECTOMY LAPAROSCOPIC;  Surgeon: De BlanchLuke Aaron Kinsinger, MD;  [facility];  Service: General;  Laterality: N/A;    Social History   Socioeconomic History  . Marital status: Single    Spouse name: Not on file  . Number of children: 2  . Years of education: Not on file  . Highest education level: Not on file  Occupational History  . Occupation: Warehouse  Tobacco Use  . Smoking status: Never Smoker  . Smokeless tobacco: Never Used  Substance and Sexual Activity  . Alcohol use: Yes    Alcohol/week: 0.0 standard drinks    Comment: Ociassionally/ A beer once in a while   . Drug use:  No  . Sexual activity: Yes  Other Topics Concern  . Not on file  Social History Narrative   Marital status: single; dating seriously x 10 years.  From [place]; [place] since 10 years.      Children: one child.      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   Social Determinants of Health   Financial Resource Strain:   . Difficulty of Paying Living Expenses: Not on file  Food Insecurity:   . Worried About Programme researcher, broadcasting/film/videounning Out of Food in the Last Year: Not on file  . Ran Out of Food in the Last Year: Not on file  Transportation Needs:   . Lack of Transportation (Medical): Not on file  . Lack of Transportation (Non-Medical): Not on file  Physical Activity:   . Days of Exercise per Week: Not on file  . Minutes of Exercise per Session: Not on file  Stress:   . Feeling of Stress : Not on file  Social Connections:   . Frequency of Communication with Friends and Family: Not on file  . Frequency of Social Gatherings with Friends and Family: Not on file  . Attends Religious Services: Not on file  . Active Member of Clubs or Organizations: Not on file  . Attends BankerClub or Organization Meetings: Not on file  . Marital Status: Not on file  Intimate Partner Violence:   . Fear of Current or Ex-Partner:  Not on file  . Emotionally Abused: Not on file  . Physically Abused: Not on file  . Sexually Abused: Not on file    Family History  Problem Relation Age of Onset  . Hypertension Mother   . Colon cancer Neg Hx   . Esophageal cancer Neg Hx      Review of Systems  Constitutional: Negative.  Negative for chills and fever.  HENT: Negative.  Negative for congestion and sore throat.   Respiratory: Negative.  Negative for cough and shortness of breath.   Cardiovascular: Negative.  Negative for chest pain and palpitations.  Gastrointestinal: Negative.  Negative for abdominal pain, diarrhea, nausea and vomiting.  Genitourinary: Positive for dysuria, flank pain and frequency. Negative for hematuria.  Musculoskeletal: Positive  for back pain.  Skin: Negative.  Negative for rash.  Neurological: Negative.  Negative for dizziness and headaches.  All other systems reviewed and are negative.   Today's Vitals   02/13/19 1407  BP: 117/68  Pulse: 73  Resp: 16  Temp: 98.6 F (37 C)  TempSrc: Oral  SpO2: 98%  Weight: 175 lb (79.4 kg)  Height: 5\' 11"  (1.803 m)   Body mass index is 24.41 kg/m.  Physical Exam Vitals reviewed.  Constitutional:      Appearance: Normal appearance.  HENT:     Head: Normocephalic.     Mouth/Throat:     Mouth: Mucous membranes are moist.     Pharynx: Oropharynx is clear.  Eyes:     Extraocular Movements: Extraocular movements intact.     Conjunctiva/sclera: Conjunctivae normal.     Pupils: Pupils are equal, round, and reactive to light.  Cardiovascular:     Rate and Rhythm: Normal rate and regular rhythm.     Pulses: Normal pulses.     Heart sounds: Normal heart sounds.  Pulmonary:     Effort: Pulmonary effort is normal.     Breath sounds: Normal breath sounds.  Abdominal:     General: There is no distension.     Palpations: Abdomen is soft. There is no mass.     Tenderness: There is no abdominal tenderness. There is no right CVA tenderness, left CVA tenderness or guarding.  Musculoskeletal:     Cervical back: Normal range of motion and neck supple.     Thoracic back: Normal.     Lumbar back: Spasms and tenderness present. No swelling or bony tenderness. Normal range of motion.  Skin:    General: Skin is warm and dry.     Capillary Refill: Capillary refill takes less than 2 seconds.  Neurological:     General: No focal deficit present.     Mental Status: He is alert and oriented to person, place, and time.  Psychiatric:        Mood and Affect: Mood normal.        Behavior: Behavior normal.    Results for orders placed or performed in visit on 02/13/19 (from the past 24 hour(s))  POCT urinalysis dipstick     Status: Normal   Collection Time: 02/13/19  2:43 PM    Result Value Ref Range   Color, UA yellow yellow   Clarity, UA clear clear   Glucose, UA negative negative mg/dL   Bilirubin, UA negative negative   Ketones, POC UA negative negative mg/dL   Spec Grav, UA 02/15/19 8.768 - 1.025   Blood, UA negative negative   pH, UA 7.0 5.0 - 8.0   Protein Ur, POC negative negative mg/dL  Urobilinogen, UA 0.2 0.2 or 1.0 E.U./dL   Nitrite, UA Negative Negative   Leukocytes, UA Negative Negative     ASSESSMENT & PLAN: Hatcher was seen today for back pain and urinary frequency.  Diagnoses and all orders for this visit:  Urinary frequency -     POCT urinalysis dipstick -     Urine culture  Back pain, unspecified back location, unspecified back pain laterality, unspecified chronicity -     POCT urinalysis dipstick -     CBC with Differential -     Comprehensive metabolic panel -     cyclobenzaprine (FLEXERIL) 10 MG tablet; Take 1 tablet (10 mg total) by mouth at bedtime.  Dysuria -     cefadroxil (DURICEF) 500 MG capsule; Take 1 capsule (500 mg total) by mouth 2 (two) times daily for 7 days. -     GC/Chlamydia probe amp ([place])not at [facility]     Patient Instructions       If you have lab work done today you will be contacted with your lab results within the next 2 weeks.  If you have not heard from [place] then please contact us. The fastest way to get your results is to register for My Chart.   IF you received an x-ray today, you will receive an invoice from [facility]. Please contact [facility] at [phone] with questions or concerns regarding your invoice.   IF you received labwork today, you will receive an invoice from [facility]. Please contact [facility] at [phone] with questions or concerns regarding your invoice.   Our billing staff will not be able to assist you with questions regarding bills from these companies.  You will be contacted with the lab results as soon as they are available. The fastest way  to get your results is to activate your My Chart account. Instructions are located on the last page of this paperwork. If you have not heard from [place] regarding the results in 2 weeks, please contact this office.     Disuria Dysuria La disuria es dolor o molestia al [employer]. El dolor o la molestia se pueden sentir en la parte del cuerpo que transporta la orina fuera de la vejiga (uretra) o en el tejido que rodea los genitales. El dolor tambin se puede sentir en la zona de la ingle, en la parte inferior del abdomen y de la zona lumbar. Quizs tenga que orinar con frecuencia o la sensacin repentina de tener que orinar (tenesmo vesical). La disuria puede afectar tanto a hombres como a mujeres, pero es ms comn en las mujeres. La causa puede deberse a muchos problemas diferentes:  Infeccin de las vas urinarias.  Clculos renales o en la vejiga.  Ciertas enfermedades de transmisin sexual (ETS), como la clamidia.  Deshidratacin.  Inflamacin de los tejidos de la vagina.  Uso de ciertos medicamentos.  Uso de ciertos jabones o productos perfumados que provocan irritacin. Siga estas indicaciones en su casa: Instrucciones generales  Controle su afeccin para detectar cualquier cambio.  Orine con frecuencia. Evite retener la orina durante largos perodos.  Despus de defecar, las mujeres deben limpiarse desde adelante hacia atrs, usando el papel higinico solo [place].  Orine despus de [employer]  Concurra a todas las visitas de control como se lo haya indicado el mdico. Esto es importante.  Si le realizaron pruebas para Landscape architect causa de la disuria, es su responsabilidad retirar los resultados de [place]. Consulte al mdico o pregunte en  el departamento donde se realiza la prueba cundo estarn [employer]. Comida y bebida   Beba suficiente lquido como para Theatre manager la orina de color amarillo plido.  Evite la cafena, el t y el alcohol.  Estos productos pueden Scientist, research (medical) vejiga y Actuary disuria. En los hombres, el alcohol puede irritar la prstata. Medicamentos  [employer] de venta libre y los recetados solamente como se lo haya indicado el mdico.  Si le recetaron un antibitico, tmelo como se lo haya indicado el mdico. No deje de tomar el antibitico aunque comience a sentirse mejor. Comunquese con un mdico si:  Tiene fiebre.  Siente dolor en la espalda o a los costados del cuerpo.  Tiene nuseas o vmitos.  Observa sangre en la orina.  Est orinando con ms frecuencia que lo habitual. Solicite ayuda de inmediato si:  El dolor es intenso y no se Engineer, production con los medicamentos.  No puede comer ni beber sin vomitar.  Se siente confundido.  Tiene una frecuencia cardaca acelerada en reposo.  Tiene temblores o escalofros.  Se siente muy dbil. Resumen  La disuria es dolor o molestia al Garment/textile technologist. Existen muchas afecciones que pueden causar disuria.  Si tiene disuria, es posible que tenga que orinar con frecuencia o tenga la sensacin repentina de tener que orinar (tenesmo vesical).  Controle su afeccin para Actuary cambio. Concurra a todas las visitas de control como se lo haya indicado el mdico.  Asegrese de Garment/textile technologist con frecuencia y beber suficiente lquido para mantener la orina de color amarillo plido. Esta informacin no tiene Marine scientist el consejo del mdico. Asegrese de hacerle al mdico cualquier pregunta que tenga. Document Released: 02/21/2007 Document Revised: 05/13/2017 Document Reviewed: 01/24/2017 Elsevier Patient Education  2020 Elsevier Inc.      Agustina Caroli, MD [facility]

## 2019-02-13 NOTE — Patient Instructions (Addendum)
   If you have lab work done today you will be contacted with your lab results within the next 2 weeks.  If you have not heard from us then please contact us. The fastest way to get your results is to register for My Chart.   IF you received an x-ray today, you will receive an invoice from [facility]. Please contact [facility] at [phone] with questions or concerns regarding your invoice.   IF you received labwork today, you will receive an invoice from [facility]. Please contact [facility] at [phone] with questions or concerns regarding your invoice.   Our billing staff will not be able to assist you with questions regarding bills from these companies.  You will be contacted with the lab results as soon as they are available. The fastest way to get your results is to activate your My Chart account. Instructions are located on the last page of this paperwork. If you have not heard from us regarding the results in 2 weeks, please contact this office.     Disuria Dysuria La disuria es dolor o molestia al orinar. El dolor o la molestia se pueden sentir en la parte del cuerpo que transporta la orina fuera de la vejiga (uretra) o en el tejido que rodea los genitales. El dolor tambin se puede sentir en la zona de la ingle, en la parte inferior del abdomen y de la zona lumbar. Quizs tenga que orinar con frecuencia o la sensacin repentina de tener que orinar (tenesmo vesical). La disuria puede afectar tanto a hombres como a mujeres, pero es ms comn en las mujeres. La causa puede deberse a muchos problemas diferentes:  Infeccin de las vas urinarias.  Clculos renales o en la vejiga.  Ciertas enfermedades de transmisin sexual (ETS), como la clamidia.  Deshidratacin.  Inflamacin de los tejidos de la vagina.  Uso de ciertos medicamentos.  Uso de ciertos jabones o productos perfumados que provocan irritacin. Siga estas indicaciones en su casa: Instrucciones  generales  Controle su afeccin para detectar cualquier cambio.  Orine con frecuencia. Evite retener la orina durante largos perodos.  Despus de defecar, las mujeres deben limpiarse desde adelante hacia atrs, usando el papel higinico solo una vez.  Orine despus de mantener relaciones sexuales.  Concurra a todas las visitas de control como se lo haya indicado el mdico. Esto es importante.  Si le realizaron pruebas para detectar la causa de la disuria, es su responsabilidad retirar los resultados de las pruebas. Consulte al mdico o pregunte en el departamento donde se realiza la prueba cundo estarn listos los resultados. Comida y bebida   Beba suficiente lquido como para mantener la orina de color amarillo plido.  Evite la cafena, el t y el alcohol. Estos productos pueden irritar la vejiga y empeorar la disuria. En los hombres, el alcohol puede irritar la prstata. Medicamentos  Tome los medicamentos de venta libre y los recetados solamente como se lo haya indicado el mdico.  Si le recetaron un antibitico, tmelo como se lo haya indicado el mdico. No deje de tomar el antibitico aunque comience a sentirse mejor. Comunquese con un mdico si:  Tiene fiebre.  Siente dolor en la espalda o a los costados del cuerpo.  Tiene nuseas o vmitos.  Observa sangre en la orina.  Est orinando con ms frecuencia que lo habitual. Solicite ayuda de inmediato si:  El dolor es intenso y no se alivia con los medicamentos.  No puede comer ni beber sin vomitar.    Se siente confundido.  Tiene una frecuencia cardaca acelerada en reposo.  Tiene temblores o escalofros.  Se siente muy dbil. Resumen  La disuria es dolor o molestia al orinar. Existen muchas afecciones que pueden causar disuria.  Si tiene disuria, es posible que tenga que orinar con frecuencia o tenga la sensacin repentina de tener que orinar (tenesmo vesical).  Controle su afeccin para detectar cualquier  cambio. Concurra a todas las visitas de control como se lo haya indicado el mdico.  Asegrese de orinar con frecuencia y beber suficiente lquido para mantener la orina de color amarillo plido. Esta informacin no tiene como fin reemplazar el consejo del mdico. Asegrese de hacerle al mdico cualquier pregunta que tenga. Document Released: 02/21/2007 Document Revised: 05/13/2017 Document Reviewed: 01/24/2017 Elsevier Patient Education  2020 Elsevier Inc.  

## 2019-02-14 LAB — CBC WITH DIFFERENTIAL/PLATELET
Basophils Absolute: 0 10*3/uL (ref 0.0–0.2)
Basos: 1 %
EOS (ABSOLUTE): 0.1 10*3/uL (ref 0.0–0.4)
Eos: 2 %
Hematocrit: 43 % (ref 37.5–51.0)
Hemoglobin: 15.5 g/dL (ref 13.0–17.7)
Immature Grans (Abs): 0 10*3/uL (ref 0.0–0.1)
Immature Granulocytes: 1 %
Lymphocytes Absolute: 1.9 10*3/uL (ref 0.7–3.1)
Lymphs: 35 %
MCH: 32.4 pg (ref 26.6–33.0)
MCHC: 36 g/dL — ABNORMAL HIGH (ref 31.5–35.7)
MCV: 90 fL (ref 79–97)
Monocytes Absolute: 0.4 10*3/uL (ref 0.1–0.9)
Monocytes: 7 %
Neutrophils Absolute: 3 10*3/uL (ref 1.4–7.0)
Neutrophils: 54 %
Platelets: 218 10*3/uL (ref 150–450)
RBC: 4.79 x10E6/uL (ref 4.14–5.80)
RDW: 11.8 % (ref 11.6–15.4)
WBC: 5.5 10*3/uL (ref 3.4–10.8)

## 2019-02-14 LAB — COMPREHENSIVE METABOLIC PANEL
ALT: 42 IU/L (ref 0–44)
AST: 32 IU/L (ref 0–40)
Albumin/Globulin Ratio: 1.9 (ref 1.2–2.2)
Albumin: 4.7 g/dL (ref 4.0–5.0)
Alkaline Phosphatase: 84 IU/L (ref 39–117)
BUN/Creatinine Ratio: 21 — ABNORMAL HIGH (ref 9–20)
BUN: 18 mg/dL (ref 6–20)
Bilirubin Total: 0.3 mg/dL (ref 0.0–1.2)
CO2: 24 mmol/L (ref 20–29)
Calcium: 9.2 mg/dL (ref 8.7–10.2)
Chloride: 101 mmol/L (ref 96–106)
Creatinine, Ser: 0.87 mg/dL (ref 0.76–1.27)
GFR calc Af Amer: 129 mL/min/{1.73_m2} (ref >59)
GFR calc non Af Amer: 112 mL/min/{1.73_m2} (ref >59)
Globulin, Total: 2.5 g/dL (ref 1.5–4.5)
Glucose: 102 mg/dL — ABNORMAL HIGH (ref 65–99)
Potassium: 4.3 mmol/L (ref 3.5–5.2)
Sodium: 138 mmol/L (ref 134–144)
Total Protein: 7.2 g/dL (ref 6.0–8.5)

## 2019-02-14 LAB — GC/CHLAMYDIA PROBE AMP (~~LOC~~) NOT AT ARMC
Chlamydia: NEGATIVE
Comment: NEGATIVE
Comment: NORMAL
Neisseria Gonorrhea: NEGATIVE

## 2019-02-15 LAB — URINE CULTURE: Organism ID, Bacteria: NO GROWTH

## 2019-07-13 ENCOUNTER — Other Ambulatory Visit: Payer: Self-pay

## 2019-07-13 ENCOUNTER — Encounter: Payer: Self-pay | Admitting: Family Medicine

## 2019-07-13 ENCOUNTER — Ambulatory Visit (INDEPENDENT_AMBULATORY_CARE_PROVIDER_SITE_OTHER): Payer: Managed Care, Other (non HMO) | Admitting: Family Medicine

## 2019-07-13 VITALS — BP 122/68 | HR 60 | Temp 98.3°F | Ht 71.0 in | Wt 173.6 lb

## 2019-07-13 DIAGNOSIS — I10 Essential (primary) hypertension: Secondary | ICD-10-CM

## 2019-07-13 DIAGNOSIS — H00011 Hordeolum externum right upper eyelid: Secondary | ICD-10-CM | POA: Diagnosis not present

## 2019-07-13 MED ORDER — LISINOPRIL 10 MG PO TABS: ORAL_TABLET | ORAL | 1 refills | Status: DC

## 2019-07-13 MED ORDER — OFLOXACIN 0.3 % OP SOLN: 1.0000 [drp] | Freq: Four times a day (QID) | OPHTHALMIC | 0 refills | Status: DC

## 2019-07-13 MED ORDER — DOXYCYCLINE HYCLATE 100 MG PO CAPS: 100.0000 mg | ORAL_CAPSULE | Freq: Two times a day (BID) | ORAL | 0 refills | Status: DC

## 2019-07-13 NOTE — Progress Notes (Signed)
Patient ID: Garrett Miller, male    DOB: 08-14-1985  Age: 34 y.o. MRN: [id number]  Chief Complaint  Patient presents with  . Eye Pain    Pt has a bump on the inside of his Rt eye lid that has been there for the past 2 days and it is very painful    Subjective:   Old man who has a swollen painful right upper eyelid for a couple of days.  Now has a large pustule on it.  He gets a lot of styes.  He works in a Social research officer, government.  Not allergic to any medications.  He also needs a refill on his blood pressure medication.  Current allergies, medications, problem list, past/family and social histories reviewed.  Objective:  BP 122/68 (BP Location: Right Arm, Patient Position: Sitting, Cuff Size: Normal)   Pulse 60   Temp 98.3 F (36.8 C) (Temporal)   Ht 5\' 11"  (1.803 m)   Wt 173 lb 9.6 oz (78.7 kg)   SpO2 97%   BMI 24.21 kg/m   Pressure is good is noted  He has a large stye on the lateral third of the upper lid, but the swelling and erythema extends all the way across the lid.    Procedure: Using an 18-gauge needle the pustule was opened and culture taken.  Patient tolerated procedure well.  I was able to express pus out of it, and then he was able to express a little bit more himself. Assessment & Plan:   Assessment: 1. Hordeolum externum of right upper eyelid   2. Essential hypertension       Plan: See instructions. No orders of the defined types were placed in this encounter.   No orders of the defined types were placed in this encounter.        Patient Instructions       If you have lab work done today you will be contacted with your lab results within the next 2 weeks.  If you have not heard from then please contact us. The fastest way to get your results is to register for My Chart.   IF you received an x-ray today, you will receive an invoice from [facility]. Please contact [facility] at [phone] with questions or concerns regarding  your invoice.   IF you received labwork today, you will receive an invoice from [facility]. Please contact [facility] at [phone] with questions or concerns regarding your invoice.   Our billing staff will not be able to assist you with questions regarding bills from these companies.  You will be contacted with the lab results as soon as they are available. The fastest way to get your results is to activate your My Chart account. Instructions are located on the last page of this paperwork. If you have not heard from [phone] regarding the results in 2 weeks, please contact this office.        No follow-ups on file.   [place], MD 07/13/2019

## 2019-07-13 NOTE — Patient Instructions (Addendum)
Take doxycycline 100 mg 1 twice daily for 1 week  Use the ofloxacin eyedrops 4 times daily in the right eye  Go get rechecked over the holiday weekend if necessary if it is looking worse..  I would recommend a good urgent care.  I suggest the possibility of the [facility] urgent care and  at the [facility].  Continue current blood pressure medication.  Return to see Dr. Alvy Bimler in 3 to 4 months.        If you have lab work done today you will be contacted with your lab results within the next 2 weeks.  If you have not heard from [place] then please contact us. The fastest way to get your results is to register for My Chart.   IF you received an x-ray today, you will receive an invoice from [facility]. Please contact [facility] at [phone] with questions or concerns regarding your invoice.   IF you received labwork today, you will receive an invoice from [place]. Please contact [facility] at [phone] with questions or concerns regarding your invoice.   Our billing staff will not be able to assist you with questions regarding bills from these companies.  You will be contacted with the lab results as soon as they are available. The fastest way to get your results is to activate your My Chart account. Instructions are located on the last page of this paperwork. If you have not heard from [place] regarding the results in 2 weeks, please contact this office.

## 2019-07-16 LAB — WOUND CULTURE: Organism ID, Bacteria: NONE SEEN

## 2019-07-19 NOTE — Progress Notes (Signed)
Call patient:No bacteria grew from the eyelid culture.  I hope the infection has resolved.  If he continues to have further problems we may need to refer him to an eye specialist.Thank youDavid H. Alwyn Ren, MD

## 2019-09-03 ENCOUNTER — Other Ambulatory Visit: Payer: Self-pay

## 2019-09-03 ENCOUNTER — Ambulatory Visit (INDEPENDENT_AMBULATORY_CARE_PROVIDER_SITE_OTHER): Payer: Managed Care, Other (non HMO) | Admitting: Emergency Medicine

## 2019-09-03 ENCOUNTER — Encounter: Payer: Self-pay | Admitting: Emergency Medicine

## 2019-09-03 VITALS — BP 116/67 | HR 74 | Temp 98.4°F | Resp 15 | Ht 71.0 in | Wt 169.4 lb

## 2019-09-03 DIAGNOSIS — G4700 Insomnia, unspecified: Secondary | ICD-10-CM

## 2019-09-03 DIAGNOSIS — R350 Frequency of micturition: Secondary | ICD-10-CM | POA: Diagnosis not present

## 2019-09-03 DIAGNOSIS — B999 Unspecified infectious disease: Secondary | ICD-10-CM | POA: Diagnosis not present

## 2019-09-03 LAB — POCT URINALYSIS DIP (MANUAL ENTRY)
Bilirubin, UA: NEGATIVE
Blood, UA: NEGATIVE
Glucose, UA: NEGATIVE mg/dL
Ketones, POC UA: NEGATIVE mg/dL
Leukocytes, UA: NEGATIVE
Nitrite, UA: NEGATIVE
Protein Ur, POC: NEGATIVE mg/dL
Spec Grav, UA: 1.02 (ref 1.010–1.025)
Urobilinogen, UA: 1 E.U./dL
pH, UA: 7 (ref 5.0–8.0)

## 2019-09-03 MED ORDER — HYDROXYZINE HCL 25 MG PO TABS: 25.0000 mg | ORAL_TABLET | Freq: Every evening | ORAL | 0 refills | Status: DC | PRN

## 2019-09-03 MED ORDER — CIPROFLOXACIN HCL 500 MG PO TABS: 500.0000 mg | ORAL_TABLET | Freq: Two times a day (BID) | ORAL | 0 refills | Status: AC

## 2019-09-03 NOTE — Progress Notes (Signed)
Garrett Miller 34 y.o.   Chief Complaint  Patient presents with   Urinary Frequency    pt has had burning, frequencym urgency, pt also reports feeling as if he cannot empty his bladder fully. started apx. 5 days ago. pt is sexually active does not report more than one partner within the past month     HISTORY OF PRESENT ILLNESS: This is a 34 y.o. male complaining of urinary frequency with burning, unable to empty bladder fully, nocturia that started 5 days ago.  Has not been able to sleep well due to this.  No other significant symptoms.  HPI   Prior to Admission medications   Medication Sig Start Date End Date Taking? Authorizing Provider  lisinopril (ZESTRIL) 10 MG tablet TAKE 1 TABLET(10 MG) BY MOUTH DAILY 07/13/19  Yes Peyton Najjar, MD  cyclobenzaprine (FLEXERIL) 10 MG tablet Take 1 tablet (10 mg total) by mouth at bedtime. Patient not taking: Reported on 09/03/2019 02/13/19   Georgina Quint, MD  doxycycline (VIBRAMYCIN) 100 MG capsule Take 1 capsule (100 mg total) by mouth 2 (two) times daily. Patient not taking: Reported on 09/03/2019 07/13/19   Peyton Najjar, MD  famotidine (PEPCID) 40 MG tablet Take 1 tablet (40 mg total) by mouth daily. 10/05/18 11/04/18  Georgina Quint, MD  ofloxacin (OCUFLOX) 0.3 % ophthalmic solution Place 1 drop into the right eye 4 (four) times daily. Patient not taking: Reported on 09/03/2019 07/13/19   Peyton Najjar, MD  pantoprazole (PROTONIX) 40 MG tablet Take 1 tablet (40 mg total) by mouth daily. 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 12/06/2018   Essential hypertension 02/10/2015   HTN (hypertension) 08/22/2013    Past Medical History:  Diagnosis Date   Hypertension     Past Surgical History:  Procedure Laterality Date   CHOLECYSTECTOMY  2013   LAPAROSCOPIC APPENDECTOMY N/A 06/24/2015    Procedure: APPENDECTOMY LAPAROSCOPIC;  Surgeon: De Blanch Kinsinger, MD;  Location: [facility] OR;  Service: General;  Laterality: N/A;    Social History   Socioeconomic History   Marital status: Single    Spouse name: Not on file   Number of children: 2   Years of education: Not on file   Highest education level: Not on file  Occupational History   Occupation: Warehouse  Tobacco Use   Smoking status: Never Smoker   Smokeless tobacco: Never Used  Building services engineer Use: Never used  Substance and Sexual Activity   Alcohol use: Yes    Alcohol/week: 0.0 standard drinks    Comment: Ociassionally/ A beer once in a while    Drug use: No   Sexual activity: Yes  Other Topics Concern   Not on file  Social History Narrative   Marital status: single; dating seriously x 10 years.  From [place]; [place] since 10 years.      Children: one child.      Employment: Naval architect.   Social Determinants of Health   Financial Resource Strain:    Difficulty of Paying Living Expenses:   Food Insecurity:    Worried About Programme researcher, broadcasting/film/video in the Last Year:    Barista in the Last Year:   Transportation Needs:    Freight forwarder (Medical):    Lack of Transportation (Non-Medical):   Physical Activity:    Days  of Exercise per Week:    Minutes of Exercise per Session:   Stress:    Feeling of Stress :   Social Connections:    Frequency of Communication with Friends and Family:    Frequency of Social Gatherings with Friends and Family:    Attends Religious Services:    Active Member of Clubs or Organizations:    Attends Engineer, structural:    Marital Status:   Intimate Partner Violence:    Fear of Current or Ex-Partner:    Emotionally Abused:    Physically Abused:    Sexually Abused:     Family History  Problem Relation Age of Onset   Hypertension Mother    Colon cancer Neg Hx    Esophageal cancer Neg Hx      Review of Systems    Constitutional: Negative.  Negative for chills and fever.  HENT: Negative.  Negative for congestion and sore throat.   Respiratory: Negative.  Negative for shortness of breath.   Cardiovascular: Negative.  Negative for palpitations.  Gastrointestinal: Negative for abdominal pain, diarrhea, nausea and vomiting.  Genitourinary: Positive for dysuria, frequency and urgency. Negative for flank pain and hematuria.  Skin: Negative.   Neurological: Negative for dizziness and headaches.  All other systems reviewed and are negative.  Today's Vitals   09/03/19 1540  BP: 116/67  Pulse: 74  Resp: 15  Temp: 98.4 F (36.9 C)  TempSrc: Temporal  SpO2: 100%  Weight: 169 lb 6.4 oz (76.8 kg)  Height: 5\' 11"  (1.803 m)   Body mass index is 23.63 kg/m.   Physical Exam Vitals reviewed.  Constitutional:      Appearance: Normal appearance.  HENT:     Head: Normocephalic.  Eyes:     Extraocular Movements: Extraocular movements intact.     Pupils: Pupils are equal, round, and reactive to light.  Cardiovascular:     Rate and Rhythm: Normal rate and regular rhythm.     Heart sounds: Normal heart sounds.  Pulmonary:     Effort: Pulmonary effort is normal.     Breath sounds: Normal breath sounds.  Abdominal:     General: There is no distension.     Palpations: Abdomen is soft.     Tenderness: There is no abdominal tenderness.  Musculoskeletal:        General: Normal range of motion.     Cervical back: Normal range of motion.  Skin:    General: Skin is warm and dry.     Capillary Refill: Capillary refill takes less than 2 seconds.  Neurological:     General: No focal deficit present.     Mental Status: He is alert and oriented to person, place, and time.  Psychiatric:        Mood and Affect: Mood normal.        Behavior: Behavior normal.    Results for orders placed or performed in visit on 09/03/19 (from the past 24 hour(s))  POCT urinalysis dipstick     Status: Normal   Collection  Time: 09/03/19  3:49 PM  Result Value Ref Range   Color, UA yellow yellow   Clarity, UA clear clear   Glucose, UA negative negative mg/dL   Bilirubin, UA negative negative   Ketones, POC UA negative negative mg/dL   Spec Grav, UA 09/05/19 3.500 - 1.025   Blood, UA negative negative   pH, UA 7.0 5.0 - 8.0   Protein Ur, POC negative negative mg/dL  Urobilinogen, UA 1.0 0.2 or 1.0 E.U./dL   Nitrite, UA Negative Negative   Leukocytes, UA Negative Negative     ASSESSMENT & PLAN: Heather was seen today for urinary frequency.  Diagnoses and all orders for this visit:  Frequent urination -     POCT urinalysis dipstick -     Urine Culture  Clinical infection Comments: Suspect a UTI Orders: -     ciprofloxacin (CIPRO) 500 MG tablet; Take 1 tablet (500 mg total) by mouth 2 (two) times daily for 7 days.  Insomnia, unspecified type -     hydrOXYzine (ATARAX/VISTARIL) 25 MG tablet; Take 1 tablet (25 mg total) by mouth at bedtime as needed.    Patient Instructions       If you have lab work done today you will be contacted with your lab results within the next 2 weeks.  If you have not heard from [place] then please contact us. The fastest way to get your results is to register for My Chart.   IF you received an x-ray today, you will receive an invoice from [facility]. Please contact [facility] at [phone] with questions or concerns regarding your invoice.   IF you received labwork today, you will receive an invoice from [facility]. Please contact [facility] at [phone] with questions or concerns regarding your invoice.   Our billing staff will not be able to assist you with questions regarding bills from these companies.  You will be contacted with the lab results as soon as they are available. The fastest way to get your results is to activate your My Chart account. Instructions are located on the last page of this paperwork. If you have not heard from [place]  regarding the results in 2 weeks, please contact this office.     Disuria Dysuria La disuria es dolor o molestia al [employer]. El dolor o la molestia se pueden sentir en la parte del cuerpo que transporta la orina fuera de la vejiga (uretra) o en el tejido que rodea los genitales. El dolor tambin se puede sentir en la zona de la ingle, en la parte inferior del abdomen y de la zona lumbar. Quizs tenga que orinar con frecuencia o la sensacin repentina de tener que orinar (tenesmo vesical). La disuria puede afectar tanto a hombres como a mujeres, pero es ms comn en las mujeres. La causa puede deberse a muchos problemas diferentes:  Infeccin de las vas urinarias.  Clculos renales o en la vejiga.  Ciertas enfermedades de transmisin sexual (ETS), como la clamidia.  Deshidratacin.  Inflamacin de los tejidos de la vagina.  Uso de ciertos medicamentos.  Uso de ciertos jabones o productos perfumados que provocan irritacin. Siga estas indicaciones en su casa: Instrucciones generales  Controle su afeccin para detectar cualquier cambio.  Orine con frecuencia. Evite retener la orina durante largos perodos.  Despus de defecar, las mujeres deben limpiarse desde adelante hacia atrs, usando el papel higinico solo [place].  Orine despus de [employer]  Concurra a todas las visitas de control como se lo haya indicado el mdico. Esto es importante.  Si le realizaron pruebas para Landscape architect causa de la disuria, es su responsabilidad retirar los resultados de [place]. Consulte al mdico o pregunte en el departamento donde se realiza la prueba cundo estarn [employer]. Comida y bebida   Beba suficiente lquido como para Pharmacologist la orina de color amarillo plido.  Evite la cafena, el t y el alcohol. Estos productos  pueden irritar la vejiga y Probation officerempeorar la disuria. En los hombres, el alcohol puede irritar la prstata. Medicamentos  Intel Corporationome los  medicamentos de venta libre y los recetados solamente como se lo haya indicado el mdico.  Si le recetaron un antibitico, tmelo como se lo haya indicado el mdico. No deje de tomar el antibitico aunque comience a sentirse mejor. Comunquese con un mdico si:  Tiene fiebre.  Siente dolor en la espalda o a los costados del cuerpo.  Tiene nuseas o vmitos.  Observa sangre en la orina.  Est orinando con ms frecuencia que lo habitual. Solicite ayuda de inmediato si:  El dolor es intenso y no se Chief Executive Officeralivia con los medicamentos.  No puede comer ni beber sin vomitar.  Se siente confundido.  Tiene una frecuencia cardaca acelerada en reposo.  Tiene temblores o escalofros.  Se siente muy dbil. Resumen  La disuria es dolor o molestia al Geographical information systems officerorinar. Existen muchas afecciones que pueden causar disuria.  Si tiene disuria, es posible que tenga que orinar con frecuencia o tenga la sensacin repentina de tener que orinar (tenesmo vesical).  Controle su afeccin para Insurance risk surveyordetectar cualquier cambio. Concurra a todas las visitas de control como se lo haya indicado el mdico.  Asegrese de Geographical information systems officerorinar con frecuencia y beber suficiente lquido para mantener la orina de color amarillo plido. Esta informacin no tiene Theme park managercomo fin reemplazar el consejo del mdico. Asegrese de hacerle al mdico cualquier pregunta que tenga. Document Revised: 05/13/2017 Document Reviewed: 01/24/2017 Elsevier Patient Education  2020 Elsevier Inc.      Edwina BarthMiguel Charnay Nazario, MD [facility]

## 2019-09-03 NOTE — Patient Instructions (Addendum)
   If you have lab work done today you will be contacted with your lab results within the next 2 weeks.  If you have not heard from us then please contact us. The fastest way to get your results is to register for My Chart.   IF you received an x-ray today, you will receive an invoice from [facility]. Please contact [facility] at [phone] with questions or concerns regarding your invoice.   IF you received labwork today, you will receive an invoice from [facility]. Please contact [facility] at [phone] with questions or concerns regarding your invoice.   Our billing staff will not be able to assist you with questions regarding bills from these companies.  You will be contacted with the lab results as soon as they are available. The fastest way to get your results is to activate your My Chart account. Instructions are located on the last page of this paperwork. If you have not heard from us regarding the results in 2 weeks, please contact this office.     Disuria Dysuria La disuria es dolor o molestia al orinar. El dolor o la molestia se pueden sentir en la parte del cuerpo que transporta la orina fuera de la vejiga (uretra) o en el tejido que rodea los genitales. El dolor tambin se puede sentir en la zona de la ingle, en la parte inferior del abdomen y de la zona lumbar. Quizs tenga que orinar con frecuencia o la sensacin repentina de tener que orinar (tenesmo vesical). La disuria puede afectar tanto a hombres como a mujeres, pero es ms comn en las mujeres. La causa puede deberse a muchos problemas diferentes:  Infeccin de las vas urinarias.  Clculos renales o en la vejiga.  Ciertas enfermedades de transmisin sexual (ETS), como la clamidia.  Deshidratacin.  Inflamacin de los tejidos de la vagina.  Uso de ciertos medicamentos.  Uso de ciertos jabones o productos perfumados que provocan irritacin. Siga estas indicaciones en su casa: Instrucciones  generales  Controle su afeccin para detectar cualquier cambio.  Orine con frecuencia. Evite retener la orina durante largos perodos.  Despus de defecar, las mujeres deben limpiarse desde adelante hacia atrs, usando el papel higinico solo una vez.  Orine despus de mantener relaciones sexuales.  Concurra a todas las visitas de control como se lo haya indicado el mdico. Esto es importante.  Si le realizaron pruebas para detectar la causa de la disuria, es su responsabilidad retirar los resultados de las pruebas. Consulte al mdico o pregunte en el departamento donde se realiza la prueba cundo estarn listos los resultados. Comida y bebida   Beba suficiente lquido como para mantener la orina de color amarillo plido.  Evite la cafena, el t y el alcohol. Estos productos pueden irritar la vejiga y empeorar la disuria. En los hombres, el alcohol puede irritar la prstata. Medicamentos  Tome los medicamentos de venta libre y los recetados solamente como se lo haya indicado el mdico.  Si le recetaron un antibitico, tmelo como se lo haya indicado el mdico. No deje de tomar el antibitico aunque comience a sentirse mejor. Comunquese con un mdico si:  Tiene fiebre.  Siente dolor en la espalda o a los costados del cuerpo.  Tiene nuseas o vmitos.  Observa sangre en la orina.  Est orinando con ms frecuencia que lo habitual. Solicite ayuda de inmediato si:  El dolor es intenso y no se alivia con los medicamentos.  No puede comer ni beber sin vomitar.    Se siente confundido.  Tiene una frecuencia cardaca acelerada en reposo.  Tiene temblores o escalofros.  Se siente muy dbil. Resumen  La disuria es dolor o molestia al orinar. Existen muchas afecciones que pueden causar disuria.  Si tiene disuria, es posible que tenga que orinar con frecuencia o tenga la sensacin repentina de tener que orinar (tenesmo vesical).  Controle su afeccin para detectar cualquier  cambio. Concurra a todas las visitas de control como se lo haya indicado el mdico.  Asegrese de orinar con frecuencia y beber suficiente lquido para mantener la orina de color amarillo plido. Esta informacin no tiene como fin reemplazar el consejo del mdico. Asegrese de hacerle al mdico cualquier pregunta que tenga. Document Revised: 05/13/2017 Document Reviewed: 01/24/2017 Elsevier Patient Education  2020 Elsevier Inc.  

## 2019-09-20 ENCOUNTER — Other Ambulatory Visit: Payer: Self-pay | Admitting: Emergency Medicine

## 2019-09-20 DIAGNOSIS — I10 Essential (primary) hypertension: Secondary | ICD-10-CM

## 2019-10-18 ENCOUNTER — Other Ambulatory Visit: Payer: Self-pay

## 2019-10-18 ENCOUNTER — Encounter: Payer: Self-pay | Admitting: Emergency Medicine

## 2019-10-18 ENCOUNTER — Ambulatory Visit (INDEPENDENT_AMBULATORY_CARE_PROVIDER_SITE_OTHER): Payer: Managed Care, Other (non HMO) | Admitting: Emergency Medicine

## 2019-10-18 VITALS — BP 130/64 | HR 89 | Temp 98.5°F | Ht 70.5 in | Wt 172.2 lb

## 2019-10-18 DIAGNOSIS — R350 Frequency of micturition: Secondary | ICD-10-CM | POA: Diagnosis not present

## 2019-10-18 DIAGNOSIS — R399 Unspecified symptoms and signs involving the genitourinary system: Secondary | ICD-10-CM | POA: Diagnosis not present

## 2019-10-18 LAB — POCT URINALYSIS DIP (MANUAL ENTRY)
Bilirubin, UA: NEGATIVE
Glucose, UA: NEGATIVE mg/dL
Ketones, POC UA: NEGATIVE mg/dL
Leukocytes, UA: NEGATIVE
Nitrite, UA: NEGATIVE
Protein Ur, POC: NEGATIVE mg/dL
Spec Grav, UA: >=1.03 — AB (ref 1.010–1.025)
Urobilinogen, UA: 0.2 E.U./dL
pH, UA: 7 (ref 5.0–8.0)

## 2019-10-18 NOTE — Patient Instructions (Addendum)
   If you have lab work done today you will be contacted with your lab results within the next 2 weeks.  If you have not heard from us then please contact us. The fastest way to get your results is to register for My Chart.   IF you received an x-ray today, you will receive an invoice from [facility]. Please contact [facility] at [phone] with questions or concerns regarding your invoice.   IF you received labwork today, you will receive an invoice from [facility]. Please contact [facility] at [phone] with questions or concerns regarding your invoice.   Our billing staff will not be able to assist you with questions regarding bills from these companies.  You will be contacted with the lab results as soon as they are available. The fastest way to get your results is to activate your My Chart account. Instructions are located on the last page of this paperwork. If you have not heard from us regarding the results in 2 weeks, please contact this office.     Disuria Dysuria La disuria es dolor o molestia al orinar. El dolor o la molestia se pueden sentir en la parte del cuerpo que transporta la orina fuera de la vejiga (uretra) o en el tejido que rodea los genitales. El dolor tambin se puede sentir en la zona de la ingle, en la parte inferior del abdomen y de la zona lumbar. Quizs tenga que orinar con frecuencia o la sensacin repentina de tener que orinar (tenesmo vesical). La disuria puede afectar tanto a hombres como a mujeres, pero es ms comn en las mujeres. La causa puede deberse a muchos problemas diferentes:  Infeccin de las vas urinarias.  Clculos renales o en la vejiga.  Ciertas enfermedades de transmisin sexual (ETS), como la clamidia.  Deshidratacin.  Inflamacin de los tejidos de la vagina.  Uso de ciertos medicamentos.  Uso de ciertos jabones o productos perfumados que provocan irritacin. Siga estas indicaciones en su casa: Instrucciones  generales  Controle su afeccin para detectar cualquier cambio.  Orine con frecuencia. Evite retener la orina durante largos perodos.  Despus de defecar, las mujeres deben limpiarse desde adelante hacia atrs, usando el papel higinico solo una vez.  Orine despus de mantener relaciones sexuales.  Concurra a todas las visitas de control como se lo haya indicado el mdico. Esto es importante.  Si le realizaron pruebas para detectar la causa de la disuria, es su responsabilidad retirar los resultados de las pruebas. Consulte al mdico o pregunte en el departamento donde se realiza la prueba cundo estarn listos los resultados. Comida y bebida   Beba suficiente lquido como para mantener la orina de color amarillo plido.  Evite la cafena, el t y el alcohol. Estos productos pueden irritar la vejiga y empeorar la disuria. En los hombres, el alcohol puede irritar la prstata. Medicamentos  Tome los medicamentos de venta libre y los recetados solamente como se lo haya indicado el mdico.  Si le recetaron un antibitico, tmelo como se lo haya indicado el mdico. No deje de tomar el antibitico aunque comience a sentirse mejor. Comunquese con un mdico si:  Tiene fiebre.  Siente dolor en la espalda o a los costados del cuerpo.  Tiene nuseas o vmitos.  Observa sangre en la orina.  Est orinando con ms frecuencia que lo habitual. Solicite ayuda de inmediato si:  El dolor es intenso y no se alivia con los medicamentos.  No puede comer ni beber sin vomitar.    vomitar.  Se siente confundido.  Tiene una frecuencia cardaca acelerada en reposo.  Tiene temblores o escalofros.  Se siente muy dbil. Resumen  La disuria es dolor o molestia al Geographical information systems officer. Existen muchas afecciones que pueden causar disuria.  Si tiene disuria, es posible que tenga que orinar con frecuencia o tenga la sensacin repentina de tener que orinar (tenesmo vesical).  Controle su afeccin  para Insurance risk surveyor cambio. Concurra a todas las visitas de control como se lo haya indicado el mdico.  Asegrese de Geographical information systems officer con frecuencia y beber suficiente lquido para mantener la orina de color amarillo plido. Esta informacin no tiene Theme park manager el consejo del mdico. Asegrese de hacerle al mdico cualquier pregunta que tenga. Document Revised: 05/13/2017 Document Reviewed: 01/24/2017 Elsevier Patient Education  2020 [employer].

## 2019-10-18 NOTE — Progress Notes (Signed)
Garrett Miller 34 y.o.   Chief Complaint  Patient presents with   Urinary Frequency    3 month f/u. Still having frequency and burning with urination    HISTORY OF PRESENT ILLNESS: This is a 34 y.o. male complaining of urinary frequency and burning waxing and waning for the last 3 months. Seen by me last July and started on Cipro with no relief.  Urinalysis and urine culture were within normal limits. Went to another clinic a week later.  Started on a different antibiotic.  Had negative chlamydia test.  Normal urinalysis and culture. Digital rectal exam was unremarkable.  Started on Flomax with some mild improvement of symptoms. Denies fever or chills.  Able to eat and drink.  Denies nausea or vomiting.  Denies gross hematuria.  Married and only sexually active with his wife.  Denies any other associated significant symptoms.  HPI   Prior to Admission medications   Medication Sig Start Date End Date Taking? Authorizing Provider  lisinopril (ZESTRIL) 10 MG tablet TAKE 1 TABLET(10 MG) BY MOUTH DAILY 07/13/19  Yes Peyton NajjarHopper, David H, MD    No Known Allergies  Patient Active Problem List   Diagnosis Date Noted   Chronic superficial gastritis without bleeding 12/06/2018   Essential hypertension 02/10/2015   HTN (hypertension) 08/22/2013    Past Medical History:  Diagnosis Date   Hypertension     Past Surgical History:  Procedure Laterality Date   CHOLECYSTECTOMY  2013   LAPAROSCOPIC APPENDECTOMY N/A 06/24/2015   Procedure: APPENDECTOMY LAPAROSCOPIC;  Surgeon: De BlanchLuke Aaron Kinsinger, MD;  Location: [facility] OR;  Service: General;  Laterality: N/A;    Social History   Socioeconomic History   Marital status: Single    Spouse name: Not on file   Number of children: 2   Years of education: Not on file   Highest education level: Not on file  Occupational History   Occupation: Warehouse  Tobacco Use   Smoking status: Never Smoker   Smokeless tobacco: Never Used  Water quality scientistVaping  Use   Vaping Use: Never used  Substance and Sexual Activity   Alcohol use: Yes    Alcohol/week: 0.0 standard drinks    Comment: Ociassionally/ A beer once in a while    Drug use: No   Sexual activity: Yes  Other Topics Concern   Not on file  Social History Narrative   Marital status: single; dating seriously x 10 years.  From [place]; [place] since 10 years.      Children: one child.      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   Social Determinants of Health   Financial Resource Strain:    Difficulty of Paying Living Expenses: Not on file  Food Insecurity:    Worried About Programme researcher, broadcasting/film/videounning Out of Food in the Last Year: Not on file   The PNC Financialan Out of Food in the Last Year: Not on file  Transportation Needs:    Lack of Transportation (Medical): Not on file   Lack of Transportation (Non-Medical): Not on file  Physical Activity:    Days of Exercise per Week: Not on file   Minutes of Exercise per Session: Not on file  Stress:    Feeling of Stress : Not on file  Social Connections:    Frequency of Communication with Friends and Family: Not on file   Frequency of Social Gatherings with Friends and Family: Not on file   Attends Religious Services: Not on file   Active Member of Clubs or Organizations: Not on file  Attends Banker Meetings: Not on file   Marital Status: Not on file  Intimate Partner Violence:    Fear of Current or Ex-Partner: Not on file   Emotionally Abused: Not on file   Physically Abused: Not on file   Sexually Abused: Not on file    Family History  Problem Relation Age of Onset   Hypertension Mother    Colon cancer Neg Hx    Esophageal cancer Neg Hx      Review of Systems  Constitutional: Negative.  Negative for chills and fever.  HENT: Negative.  Negative for congestion and sore throat.   Respiratory: Negative.  Negative for cough and hemoptysis.   Cardiovascular: Negative.  Negative for chest pain and palpitations.  Gastrointestinal:  Negative.  Negative for abdominal pain, diarrhea, nausea and vomiting.  Genitourinary: Positive for dysuria, frequency and urgency. Negative for hematuria.  Musculoskeletal: Negative.  Negative for myalgias and neck pain.  Skin: Negative.  Negative for rash.  Neurological: Negative for dizziness.  All other systems reviewed and are negative.    Today's Vitals   10/18/19 1624  BP: 130/64  Pulse: 89  Temp: 98.5 F (36.9 C)  TempSrc: Temporal  SpO2: 98%  Weight: 172 lb 3.2 oz (78.1 kg)  Height: 5' 10.5" (1.791 m)   Body mass index is 24.36 kg/m.   Physical Exam Vitals reviewed.  Constitutional:      Appearance: Normal appearance.  HENT:     Head: Normocephalic.  Eyes:     Extraocular Movements: Extraocular movements intact.     Pupils: Pupils are equal, round, and reactive to light.  Cardiovascular:     Rate and Rhythm: Normal rate.  Pulmonary:     Effort: Pulmonary effort is normal.  Abdominal:     Palpations: Abdomen is soft.     Tenderness: There is no abdominal tenderness.  Musculoskeletal:        General: Normal range of motion.     Cervical back: Normal range of motion.  Skin:    General: Skin is warm and dry.     Capillary Refill: Capillary refill takes less than 2 seconds.  Neurological:     General: No focal deficit present.     Mental Status: He is alert and oriented to person, place, and time.  Psychiatric:        Mood and Affect: Mood normal.        Behavior: Behavior normal.    Results for orders placed or performed in visit on 10/18/19 (from the past 24 hour(s))  POCT urinalysis dipstick     Status: Abnormal   Collection Time: 10/18/19  4:39 PM  Result Value Ref Range   Color, UA yellow yellow   Clarity, UA clear clear   Glucose, UA negative negative mg/dL   Bilirubin, UA negative negative   Ketones, POC UA negative negative mg/dL   Spec Grav, UA >=2.725 (A) 1.010 - 1.025   Blood, UA trace-intact (A) negative   pH, UA 7.0 5.0 - 8.0   Protein  Ur, POC negative negative mg/dL   Urobilinogen, UA 0.2 0.2 or 1.0 E.U./dL   Nitrite, UA Negative Negative   Leukocytes, UA Negative Negative      ASSESSMENT & PLAN: Izrael was seen today for urinary frequency.  Diagnoses and all orders for this visit:  Lower urinary tract symptoms (LUTS) -     Urine Culture -     Ambulatory referral to Urology  Frequent urination -  POCT urinalysis dipstick    Patient Instructions       If you have lab work done today you will be contacted with your lab results within the next 2 weeks.  If you have not heard from [place] then please contact us. The fastest way to get your results is to register for My Chart.   IF you received an x-ray today, you will receive an invoice from [facility]. Please contact [facility] at [phone] with questions or concerns regarding your invoice.   IF you received labwork today, you will receive an invoice from [facility]. Please contact [facility] at [phone] with questions or concerns regarding your invoice.   Our billing staff will not be able to assist you with questions regarding bills from these companies.  You will be contacted with the lab results as soon as they are available. The fastest way to get your results is to activate your My Chart account. Instructions are located on the last page of this paperwork. If you have not heard from [place] regarding the results in 2 weeks, please contact this office.      Disuria Dysuria La disuria es dolor o molestia al [employer]. El dolor o la molestia se pueden sentir en la parte del cuerpo que transporta la orina fuera de la vejiga (uretra) o en el tejido que rodea los genitales. El dolor tambin se puede sentir en la zona de la ingle, en la parte inferior del abdomen y de la zona lumbar. Quizs tenga que orinar con frecuencia o la sensacin repentina de tener que orinar (tenesmo vesical). La disuria puede afectar tanto a hombres como a mujeres,  pero es ms comn en las mujeres. La causa puede deberse a muchos problemas diferentes:  Infeccin de las vas urinarias.  Clculos renales o en la vejiga.  Ciertas enfermedades de transmisin sexual (ETS), como la clamidia.  Deshidratacin.  Inflamacin de los tejidos de la vagina.  Uso de ciertos medicamentos.  Uso de ciertos jabones o productos perfumados que provocan irritacin. Siga estas indicaciones en su casa: Instrucciones generales  Controle su afeccin para detectar cualquier cambio.  Orine con frecuencia. Evite retener la orina durante largos perodos.  Despus de defecar, las mujeres deben limpiarse desde adelante hacia atrs, usando el papel higinico solo [place].  Orine despus de [employer]  Concurra a todas las visitas de control como se lo haya indicado el mdico. Esto es importante.  Si le realizaron pruebas para Landscape architect causa de la disuria, es su responsabilidad retirar los resultados de [place]. Consulte al mdico o pregunte en el departamento donde se realiza la prueba cundo estarn [employer]. Comida y bebida   Beba suficiente lquido como para Pharmacologist la orina de color amarillo plido.  Evite la cafena, el t y el alcohol. Estos productos pueden Theatre manager vejiga y Probation officer disuria. En los hombres, el alcohol puede irritar la prstata. Medicamentos  [employer] de venta libre y los recetados solamente como se lo haya indicado el mdico.  Si le recetaron un antibitico, tmelo como se lo haya indicado el mdico. No deje de tomar el antibitico aunque comience a sentirse mejor. Comunquese con un mdico si:  Tiene fiebre.  Siente dolor en la espalda o a los costados del cuerpo.  Tiene nuseas o vmitos.  Observa sangre en la orina.  Est orinando con ms frecuencia que lo habitual. Solicite ayuda de inmediato si:  El dolor es intenso y no se  alivia con los medicamentos.  No puede comer  ni beber sin vomitar.  Se siente confundido.  Tiene una frecuencia cardaca acelerada en reposo.  Tiene temblores o escalofros.  Se siente muy dbil. Resumen  La disuria es dolor o molestia al Geographical information systems officer. Existen muchas afecciones que pueden causar disuria.  Si tiene disuria, es posible que tenga que orinar con frecuencia o tenga la sensacin repentina de tener que orinar (tenesmo vesical).  Controle su afeccin para Insurance risk surveyor cambio. Concurra a todas las visitas de control como se lo haya indicado el mdico.  Asegrese de Geographical information systems officer con frecuencia y beber suficiente lquido para mantener la orina de color amarillo plido. Esta informacin no tiene Theme park manager el consejo del mdico. Asegrese de hacerle al mdico cualquier pregunta que tenga. Document Revised: 05/13/2017 Document Reviewed: 01/24/2017 Elsevier Patient Education  2020 Elsevier Inc.      Edwina Barth, MD [facility]

## 2019-10-20 LAB — URINE CULTURE: Organism ID, Bacteria: NO GROWTH

## 2019-10-30 ENCOUNTER — Telehealth: Payer: Self-pay | Admitting: Emergency Medicine

## 2019-10-30 NOTE — Telephone Encounter (Signed)
error 

## 2019-12-14 ENCOUNTER — Ambulatory Visit: Payer: Managed Care, Other (non HMO) | Admitting: Physical Therapy

## 2019-12-17 ENCOUNTER — Ambulatory Visit: Payer: Managed Care, Other (non HMO) | Admitting: Physical Therapy

## 2019-12-21 ENCOUNTER — Encounter: Payer: Self-pay | Admitting: Physical Therapy

## 2019-12-21 ENCOUNTER — Ambulatory Visit: Payer: Managed Care, Other (non HMO) | Attending: Urology | Admitting: Physical Therapy

## 2019-12-21 ENCOUNTER — Other Ambulatory Visit: Payer: Self-pay

## 2019-12-21 DIAGNOSIS — R279 Unspecified lack of coordination: Secondary | ICD-10-CM

## 2019-12-21 DIAGNOSIS — G8929 Other chronic pain: Secondary | ICD-10-CM | POA: Insufficient documentation

## 2019-12-21 DIAGNOSIS — M6281 Muscle weakness (generalized): Secondary | ICD-10-CM | POA: Insufficient documentation

## 2019-12-21 DIAGNOSIS — M545 Low back pain, unspecified: Secondary | ICD-10-CM | POA: Insufficient documentation

## 2019-12-21 NOTE — Therapy (Signed)
[facility] [address], [address] [place], [place], [address] Phone: [phone]   Fax:  [phone]  Physical Therapy Evaluation  Patient Details  Name: Garrett Miller MRN: [id number] Date of Birth: 02/18/1985 Referring Provider (PT): Marcine Matar, MD   Encounter Date: 12/21/2019   PT End of Session - 12/21/19 1235    Visit Number 1    Date for PT Re-Evaluation 03/14/20    Authorization Type [employer]    PT Start Time 0845    PT Stop Time 0930    PT Time Calculation (min) 45 min    Activity Tolerance Patient tolerated treatment well    Behavior During Therapy [facility] for tasks assessed/performed           Past Medical History:  Diagnosis Date   Hypertension     Past Surgical History:  Procedure Laterality Date   CHOLECYSTECTOMY  2013   LAPAROSCOPIC APPENDECTOMY N/A 06/24/2015   Procedure: APPENDECTOMY LAPAROSCOPIC;  Surgeon: Rodman Pickle, MD;  Location: [facility] OR;  Service: General;  Laterality: N/A;    There were no vitals filed for this visit.    Subjective Assessment - 12/21/19 0845    Subjective Pt referred to OPPT with ongoing history of frequency and intermittent burning with urination.  Sometimes has to go again after 5 min but not much urine.  Gets up 5x/night   MD says prostate is normal and no UTI.    Pertinent History laproscopic appendectomy, ongoing chronic back pain and buttock pain    Limitations Sitting    How long can you sit comfortably? 10 min    Patient Stated Goals be able to sleep more at night without bathroom trips, less interruption for bathroom trips    Currently in Pain? Yes    Pain Score 4     Pain Location Back    Pain Orientation Lower;Left;Right    Pain Descriptors / Indicators Sore    Pain Type Chronic pain    Pain Radiating Towards bil buttocks    Pain Onset More than a month ago    Pain Frequency Intermittent    Aggravating Factors  sitting              [facility] PT  Assessment - 12/21/19 0001      Assessment   Medical Diagnosis R30.0 (ICD-10-CM) - Dysuria, R35.0 (ICD-10-CM) - Frequency of micturition     Referring Provider (PT) Marcine Matar, MD    Onset Date/Surgical Date --   4 mos   Next MD Visit 4 mos    Prior Therapy no      Precautions   Precautions None      Restrictions   Weight Bearing Restrictions No      Balance Screen   Has the patient fallen in the past 6 months No      Home Environment   Living Environment Private residence    Living Arrangements Spouse/significant other;Children    Type of Home House      Prior Function   Level of Independence Independent    Vocation Full time employment    Vocation Requirements drive fork lift, lift, pack materials, landscaping on weekends    Leisure no time      Cognition   Overall Cognitive Status Within Functional Limits for tasks assessed      Functional Tests   Functional tests Single leg stance;Squat      Squat   Comments WNL      Single Leg  Stance   Comments more diffcult on Rt      Posture/Postural Control   Posture/Postural Control Postural limitations    Posture Comments Rt LE in ER      ROM / Strength   AROM / PROM / Strength AROM;PROM;Strength      AROM   Overall AROM Comments thoracic rot limited 25% bil, flexion/ext WNL, trunk SB limited end range bil, painful on Rt with bil SB      PROM   Overall PROM Comments bil hip IR/ER limited 30% bil      Strength   Overall Strength Comments LEs WFL with exception of Rt hip closed chain strength 4/5      Flexibility   Soft Tissue Assessment /Muscle Length yes   limited adductors bil 20%   Hamstrings limited 30% bil    Piriformis limited 30% bil    Quadratus Lumborum limited on Rt 25%      Palpation   SI assessment  WNL bil    Palpation comment tender Rt gluts, piriformis, QL      Special Tests   Other special tests limited diaphragmatic mobility bil      Ambulation/Gait   Gait Comments Rt out-toeing,  from hip                      Objective measurements completed on examination: See above findings.     Pelvic Floor Special Questions - 12/21/19 0001    Prior Pelvic/Prostate Exam Yes    Result Pelvic/Prostate Exam  normal    Prior Urinalysis Yes    Date of last Urinalysis --   2 mos ago   Result of last Urinalysis normal, clear    Currently Sexually Active Yes    Is this Painful No    History of sexually transmitted disease Yes    Urinary Leakage Yes    How often daily, drops of urine    Activities that cause leaking Other   after done urinating   Urinary urgency Yes    Urinary frequency yes, 10 daily, 5-6 nightly    Fecal incontinence No    Fluid intake 3-4 bottles water    Caffeine beverages 1 cup in AM    External Palpation able to contract PF, unable to bulge            [facility] Adult PT Treatment/Exercise - 12/21/19 0001      Self-Care   Self-Care Other Self-Care Comments    Other Self-Care Comments  pelvic model review of PF muscles and their function, how tension can affect this, Pt can try self-massage along perineum      Neuro Re-ed    Neuro Re-ed Details  SL diaphragmatic breathing with manual input at abdomen "breathe into my hand" and visual of PF drop/release of anus and perineum      Exercises   Exercises Lumbar      Lumbar Exercises: Stretches   Piriformis Stretch Left;Right;1 rep;30 seconds    Other Lumbar Stretch Exercise happy baby PF stretch 1x30                    PT Short Term Goals - 12/21/19 1247      PT SHORT TERM GOAL #1   Title pt ind with initial HEP    Time 3    Period Weeks    Status New    Target Date 01/11/20      PT SHORT TERM GOAL #2  Title Pt will be able to demo proper diaphragmatic breathing to lengthen PF    Time 3    Period Weeks    Status New    Target Date 01/11/20             PT Long Term Goals - 12/21/19 1247      PT LONG TERM GOAL #1   Title Pt will establish LE flexibility to [facility]  bil to reduce undue strain on pelvis.    Time 12    Period Weeks    Status New    Target Date 03/14/20      PT LONG TERM GOAL #2   Title Pt will be able to demo full excursion of PF motion for contract/relax/bulge for improved function for continence.    Time 12    Period Weeks    Status New    Target Date 03/14/20      PT LONG TERM GOAL #3   Title Pt will report reduced urge by at least 50% to achieve improved sleep at night and reduced interruption at work during day    Time 12    Period Weeks    Status New    Target Date 03/14/20      PT LONG TERM GOAL #4   Title Pt will report reduced incidence of post-void leakage by at least 70% due to improved emptying of bladder.    Time 12    Period Weeks    Status New    Target Date 03/14/20                  Plan - 12/21/19 1235    Clinical Impression Statement Pt referred to PT with ongoing history x 4+ months of urinary frequency day and night with occassional burning.  UTI and prostate ruled out by MD.  He has ongoing LBP worse with sitting x 10 min.  Pt reports he occassionally will leak urine after voiding or feel urge within 5 min of voiding, although there is little to void.  He voids 10-15x/day and 5-7x/night.  He would like to get more rest at night.  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  He presents with LE flexibility restrictions, decreased hip ROM, out-toed posture on Rt LE with some difficulty with Rt hip closed chain stability.  Trunk ROM is WNL with exception of bil SB with Rt QL pain with bil SB.  Rt gluts, piriformis and QL are signif tender.  He has restrictions in diaphragm and has difficulty performing abdominal breathing.  Min lift of PF and inability to bulge present as palpated through clothing today.  PT began Pt on PF stretches and SL diaphragmatic breathing for HEP.  Pt discussed using pelvic model the muscles he may try self-massage on along perineum to check for  tenderness and tension.  Pt with improved awareness of abdominal breathing and bulge initiation end of session.  Pt concerned about insurance coverage so will follow up as able given finances.  Pt is a good canditate for PT to address tension and coordination of PF.    Personal Factors and Comorbidities Profession;Time since onset of injury/illness/exacerbation    Examination-Activity Limitations Continence;Toileting;Sit    Examination-Participation Restrictions Community Activity;Occupation;Driving    Stability/Clinical Decision Making Stable/Uncomplicated    Clinical Decision Making Low    Rehab Potential Good    PT Frequency 1x / week    PT Duration 12 weeks    PT  Treatment/Interventions ADLs/Self Care Home Management;Electrical Stimulation;Cryotherapy;Moist Heat;Biofeedback;Functional mobility training;Therapeutic activities;Therapeutic exercise;Balance training;Neuromuscular re-education;Manual techniques;Patient/family education;Dry needling;Passive range of motion;Joint Manipulations;Spinal Manipulations;Taping    PT Next Visit Plan fascial massage abdominal wall/bladder, diaphragm release, have stretches helped? perineal massage, quadruped rocking and bulging, hip ROM/stretches    PT Home Exercise Plan Access Code: [email or web address] + urge drill    Consulted and Agree with Plan of Care Patient           Patient will benefit from skilled therapeutic intervention in order to improve the following deficits and impairments:  Postural dysfunction, Decreased strength, Decreased mobility, Impaired flexibility, Pain, Decreased range of motion, Increased fascial restricitons, Increased muscle spasms  Visit Diagnosis: Unspecified lack of coordination - Plan: PT plan of care cert/re-cert  Muscle weakness (generalized) - Plan: PT plan of care cert/re-cert  Chronic midline low back pain without sciatica - Plan: PT plan of care cert/re-cert     Problem List Patient Active Problem List   Diagnosis  Date Noted   Chronic superficial gastritis without bleeding 12/06/2018   Essential hypertension 02/10/2015   HTN (hypertension) 08/22/2013    Morton PetersJohanna Janica Eldred, PT 12/21/19 12:55 PM   [facility] [address] [place], [place], [address] Phone: [phone]   Fax:  [phone]  Name: Garrett Miller MRN: [id number] Date of Birth: 22-May-1985

## 2019-12-21 NOTE — Patient Instructions (Signed)
Access Code: [email or web address] URL: [email or web address] Date: 12/21/2019 Prepared by: Loistine Simas Lucian Baswell  Exercises Supine Pelvic Floor Stretch - 1 x daily - 7 x weekly - 1 sets - 2 reps - 60 hold Supine Piriformis Stretch with Foot on Ground - 1 x daily - 7 x weekly - 1 sets - 2 reps - 30 hold Sidelying Diaphragmatic Breathing - 1 x daily - 7 x weekly - 1 sets - 2-3 min hold   Urge Incontinence   Ideal urination frequency is every 2-4 wakeful hours, which equates to 5-8 times within a 24-hour period.    Urge incontinence is leakage that occurs when the bladder muscle contracts, creating a sudden need to go before getting to the bathroom.    Going too often when your bladder isn't actually full can disrupt the body's automatic signals to store and hold urine longer, which will increase urgency/frequency.  In this case, the bladder is running the show and strategies can be learned to retrain this pattern.    One should be able to control the first urge to urinate, at around .  The bladder can hold up to a grande latte, or .  To help you gain control, practice the Urge Drill below when urgency strikes.  This drill will help retrain your bladder signals and allow you to store and hold urine longer.  The overall goal is to stretch out your time between voids to reach a more manageable voiding schedule.     Practice your "quick flicks" often throughout the day (each waking hour) even when you don't need feel the urge to go.  This will help strengthen your pelvic floor muscles, making them more effective in controlling leakage.  Urge Drill  When you feel an urge to go, follow these steps to regain control: 1. Stop what you are doing and be still 2. Take one deep breath, directing your air into your abdomen 3. Think an affirming thought, such as I've got this. 4. Do 5 quick flicks of your pelvic floor 5. Walk with control to the bathroom to void, or delay  voiding

## 2020-01-04 ENCOUNTER — Ambulatory Visit: Payer: Managed Care, Other (non HMO) | Admitting: Physical Therapy

## 2020-02-01 ENCOUNTER — Encounter: Payer: Managed Care, Other (non HMO) | Admitting: Physical Therapy

## 2020-02-01 ENCOUNTER — Other Ambulatory Visit: Payer: Self-pay | Admitting: Emergency Medicine

## 2020-02-01 DIAGNOSIS — I10 Essential (primary) hypertension: Secondary | ICD-10-CM

## 2020-02-01 MED ORDER — LISINOPRIL 10 MG PO TABS: ORAL_TABLET | ORAL | 1 refills | Status: DC

## 2020-02-01 NOTE — Telephone Encounter (Signed)
Medication:  lisinopril (ZESTRIL) 10 MG tablet [[id number]]   Has the patient contacted their pharmacy? Yes   (Agent: If yes, when and what did the pharmacy advise?) call the office   Preferred Pharmacy (with phone number or street name):  [id number] [place], [place] - [address] AT [facility] [address]  [address] [place], [place] [address]  Phone:  [phone] Fax:  [phone]  Agent: Please be advised that RX refills may take up to 3 business days. We ask that you follow-up with your pharmacy.

## 2020-07-08 ENCOUNTER — Encounter: Payer: Self-pay | Admitting: Emergency Medicine

## 2020-07-08 ENCOUNTER — Other Ambulatory Visit: Payer: Self-pay

## 2020-07-08 ENCOUNTER — Ambulatory Visit (INDEPENDENT_AMBULATORY_CARE_PROVIDER_SITE_OTHER): Payer: Managed Care, Other (non HMO) | Admitting: Emergency Medicine

## 2020-07-08 VITALS — BP 136/68 | HR 63 | Temp 98.1°F | Ht 70.0 in | Wt 178.0 lb

## 2020-07-08 DIAGNOSIS — H6690 Otitis media, unspecified, unspecified ear: Secondary | ICD-10-CM

## 2020-07-08 DIAGNOSIS — I10 Essential (primary) hypertension: Secondary | ICD-10-CM

## 2020-07-08 LAB — COMPREHENSIVE METABOLIC PANEL
ALT: 17 U/L (ref 0–53)
AST: 18 U/L (ref 0–37)
Albumin: 4.7 g/dL (ref 3.5–5.2)
Alkaline Phosphatase: 60 U/L (ref 39–117)
BUN: 20 mg/dL (ref 6–23)
CO2: 29 mEq/L (ref 19–32)
Calcium: 9.5 mg/dL (ref 8.4–10.5)
Chloride: 102 mEq/L (ref 96–112)
Creatinine, Ser: 0.81 mg/dL (ref 0.40–1.50)
GFR: 113.22 mL/min (ref >60.00)
Glucose, Bld: 94 mg/dL (ref 70–99)
Potassium: 3.9 mEq/L (ref 3.5–5.1)
Sodium: 136 mEq/L (ref 135–145)
Total Bilirubin: 0.5 mg/dL (ref 0.2–1.2)
Total Protein: 7.6 g/dL (ref 6.0–8.3)

## 2020-07-08 LAB — CBC WITH DIFFERENTIAL/PLATELET
Basophils Absolute: 0 10*3/uL (ref 0.0–0.1)
Basophils Relative: 0.5 % (ref 0.0–3.0)
Eosinophils Absolute: 0.1 10*3/uL (ref 0.0–0.7)
Eosinophils Relative: 1.9 % (ref 0.0–5.0)
HCT: 44.2 % (ref 39.0–52.0)
Hemoglobin: 15.3 g/dL (ref 13.0–17.0)
Lymphocytes Relative: 29 % (ref 12.0–46.0)
Lymphs Abs: 1.9 10*3/uL (ref 0.7–4.0)
MCHC: 34.7 g/dL (ref 30.0–36.0)
MCV: 91.4 fl (ref 78.0–100.0)
Monocytes Absolute: 0.4 10*3/uL (ref 0.1–1.0)
Monocytes Relative: 6.7 % (ref 3.0–12.0)
Neutro Abs: 4.1 10*3/uL (ref 1.4–7.7)
Neutrophils Relative %: 61.9 % (ref 43.0–77.0)
Platelets: 218 10*3/uL (ref 150.0–400.0)
RBC: 4.83 Mil/uL (ref 4.22–5.81)
RDW: 12.9 % (ref 11.5–15.5)
WBC: 6.6 10*3/uL (ref 4.0–10.5)

## 2020-07-08 LAB — HEMOGLOBIN A1C: Hgb A1c MFr Bld: 5.3 % (ref 4.6–6.5)

## 2020-07-08 MED ORDER — HYDROCORTISONE-ACETIC ACID 1-2 % OT SOLN: 3.0000 [drp] | Freq: Three times a day (TID) | OTIC | 1 refills | Status: DC

## 2020-07-08 MED ORDER — AMOXICILLIN-POT CLAVULANATE 875-125 MG PO TABS: 1.0000 | ORAL_TABLET | Freq: Two times a day (BID) | ORAL | 0 refills | Status: AC

## 2020-07-08 NOTE — Progress Notes (Signed)
**Note Garrett-Identified via Obfuscation** Garrett Miller 35 y.o.   Chief Complaint  Patient presents with  . Hypertension    Pt here for BP check. Pt also states that he has ringing in right ear for x 2weeks. Medication refill of lisinopril    HISTORY OF PRESENT ILLNESS: This is a 35 y.o. male with history of hypertension here for follow-up. Also complaining of buzzing in the right ear, "like an echo", for the past 2 weeks. Hypertension on lisinopril 10 mg daily.  Normal blood pressure readings at home. No other complaints or medical concerns today.  HPI   Prior to Admission medications   Medication Sig Start Date End Date Taking? Authorizing Provider  lisinopril (ZESTRIL) 10 MG tablet TAKE 1 TABLET(10 MG) BY MOUTH DAILY 02/01/20  Yes Garrett Najjar, MD    No Known Allergies  Patient Active Problem List   Diagnosis Date Noted  . Chronic superficial gastritis without bleeding 12/06/2018  . Essential hypertension 02/10/2015  . HTN (hypertension) 08/22/2013    Past Medical History:  Diagnosis Date  . Hypertension     Past Surgical History:  Procedure Laterality Date  . CHOLECYSTECTOMY  2013  . LAPAROSCOPIC APPENDECTOMY N/A 06/24/2015   Procedure: APPENDECTOMY LAPAROSCOPIC;  Surgeon: Garrett Blanch Kinsinger, MD;  Location: [facility] OR;  Service: General;  Laterality: N/A;    Social History   Socioeconomic History  . Marital status: Single    Spouse name: Not on file  . Number of children: 2  . Years of education: Not on file  . Highest education level: Not on file  Occupational History  . Occupation: Warehouse  Tobacco Use  . Smoking status: Never Smoker  . Smokeless tobacco: Never Used  Vaping Use  . Vaping Use: Never used  Substance and Sexual Activity  . Alcohol use: Yes    Alcohol/week: 0.0 standard drinks    Comment: Ociassionally/ A beer once in a while   . Drug use: No  . Sexual activity: Yes  Other Topics Concern  . Not on file  Social History Narrative   Marital status: single; dating seriously  x 10 years.  From [place]; [place] since 10 years.      Children: one child.      Employment: Naval architect.   Social Determinants of Health   Financial Resource Strain: Not on file  Food Insecurity: Not on file  Transportation Needs: Not on file  Physical Activity: Not on file  Stress: Not on file  Social Connections: Not on file  Intimate Partner Violence: Not on file    Family History  Problem Relation Age of Onset  . Hypertension Mother   . Colon cancer Neg Hx   . Esophageal cancer Neg Hx      Review of Systems  Constitutional: Negative.  Negative for chills and fever.  HENT: Positive for ear pain and tinnitus. Negative for congestion, nosebleeds and sore throat.   Respiratory: Negative.  Negative for cough and shortness of breath.   Cardiovascular: Negative.  Negative for chest pain and palpitations.  Gastrointestinal: Negative.  Negative for abdominal pain, diarrhea, nausea and vomiting.  Genitourinary: Negative.  Negative for dysuria and hematuria.  Skin: Negative.  Negative for rash.  Neurological: Negative.  Negative for dizziness and headaches.  All other systems reviewed and are negative.   Today's Vitals   07/08/20 1442  BP: 136/68  Pulse: 63  Temp: 98.1 F (36.7 C)  TempSrc: Oral  SpO2: 97%  Weight: 178 lb (80.7 kg)  Height: 5\' 10"  (1.778  m)   Body mass index is 25.54 kg/m. Wt Readings from Last 3 Encounters:  07/08/20 178 lb (80.7 kg)  10/18/19 172 lb 3.2 oz (78.1 kg)  09/03/19 169 lb 6.4 oz (76.8 kg)    Physical Exam Vitals reviewed.  Constitutional:      Appearance: Normal appearance.  HENT:     Head: Normocephalic.     Right Ear: Tympanic membrane is injected.     Left Ear: Tympanic membrane, ear canal and external ear normal.     Mouth/Throat:     Mouth: Mucous membranes are moist.     Pharynx: Oropharynx is clear. No oropharyngeal exudate or posterior oropharyngeal erythema.  Eyes:     Extraocular Movements: Extraocular movements intact.      Conjunctiva/sclera: Conjunctivae normal.     Pupils: Pupils are equal, round, and reactive to light.  Cardiovascular:     Rate and Rhythm: Normal rate and regular rhythm.     Pulses: Normal pulses.     Heart sounds: Normal heart sounds.  Pulmonary:     Effort: Pulmonary effort is normal.     Breath sounds: Normal breath sounds.  Musculoskeletal:        General: Normal range of motion.     Cervical back: Normal range of motion and neck supple. No tenderness.  Lymphadenopathy:     Cervical: No cervical adenopathy.  Skin:    General: Skin is warm and dry.     Capillary Refill: Capillary refill takes less than 2 seconds.  Neurological:     General: No focal deficit present.     Mental Status: He is alert and oriented to person, place, and time.  Psychiatric:        Mood and Affect: Mood normal.        Behavior: Behavior normal.      ASSESSMENT & PLAN: Essential hypertension Well-controlled hypertension with normal blood pressure readings at home. Patient taking lisinopril 10 mg daily.  May be able to wean off medication and eventually stop it altogether.  Advised to continue monitoring blood pressure readings and keep a log before discontinuing medication. Diet and nutrition discussed.  Garrett Miller was seen today for hypertension.  Diagnoses and all orders for this visit:  Essential hypertension -     Comprehensive metabolic panel -     CBC with Differential/Platelet -     Hemoglobin A1c  Ear infection -     amoxicillin-clavulanate (AUGMENTIN) 875-125 MG tablet; Take 1 tablet by mouth 2 (two) times daily for 7 days. -     acetic acid-hydrocortisone (VOSOL-HC) OTIC solution; Place 3 drops into the right ear 3 (three) times daily.    Patient Instructions   Mantenimiento Garrett Radiographer, therapeuticla salud en los hombres Health Maintenance, Male Adoptar un estilo Garrett vida saludable y recibir atencin preventiva son importantes para promover la salud y Counsellorel bienestar. Consulte al mdico sobre:  El  esquema adecuado para hacerse pruebas y exmenes peridicos.  Cosas que puede hacer por su cuenta para prevenir enfermedades y Timber Lakemantenerse sano. Qu debo saber sobre la dieta, el peso y el ejercicio? Consuma una dieta saludable  Consuma una dieta que incluya muchas verduras, frutas, productos lcteos con bajo contenido Garrett [place]grasa y Associate Professorprotenas magras.  No consuma muchos alimentos ricos en grasas slidas, azcares agregados o sodio.   Mantenga un peso saludable El ndice Garrett masa muscular [facility](IMC) es una medida que puede utilizarse para identificar posibles problemas Garrett Wrightpeso. Proporciona una estimacin Garrett la Art gallery managergrasa corporal basndose en  el peso y Print production planner. Su mdico puede ayudarle a Engineer, site IMC y a Personnel officer o Pharmacologist un peso saludable. Haga ejercicio con regularidad Haga ejercicio con regularidad. Esta es una Garrett las prcticas ms importantes que puede hacer por su salud. La mayora Garrett los adultos deben seguir estas pautas:  Education officer, environmental, al menos, Garrett actividad fsica por semana. El ejercicio debe aumentar la frecuencia cardaca y Media planner transpirar (ejercicio Garrett intensidad moderada).  Hacer ejercicios Garrett fortalecimiento por lo [employer] por semana. Agregue esto a su plan Garrett ejercicio Garrett intensidad moderada.  Pasar menos tiempo sentados. Incluso la actividad fsica ligera puede ser beneficiosa. Controle sus niveles Garrett colesterol y lpidos en la sangre Comience a realizarse anlisis Garrett lpidos y Oncologist en la sangre a los 20aos y luego reptalos cada 5aos. Es posible que Insurance underwriter los niveles Garrett colesterol con mayor frecuencia si:  Sus niveles Garrett lpidos y colesterol son altos.  Es mayor Garrett 40aos.  Presenta un alto riesgo Garrett padecer enfermedades cardacas. Qu debo saber sobre las pruebas Garrett deteccin del cncer? Muchos tipos Garrett cncer pueden detectarse Garrett manera temprana y, a menudo, pueden prevenirse. Segn su historia clnica y sus antecedentes familiares,  es posible que deba realizarse pruebas Garrett deteccin del cncer en diferentes edades. Esto puede incluir pruebas Garrett deteccin Garrett lo siguiente:  Building services engineer.  Cncer Garrett prstata.  Cncer Garrett piel.  Cncer Garrett pulmn. Qu debo saber sobre la enfermedad cardaca, la diabetes y la hipertensin arterial? Presin arterial y enfermedad cardaca  La hipertensin arterial causa enfermedades cardacas y [place] el riesgo Garrett accidente cerebrovascular. Es ms probable que esto se manifieste en las personas que tienen lecturas Garrett presin arterial alta, tienen ascendencia africana o tienen sobrepeso.  Hable con el mdico sobre sus valores Garrett presin arterial deseados.  Hgase controlar la presin arterial: ? Cada 3 a 5 aos si tiene entre 18 y 75 aos. ? Todos los aos si es mayor Garrett [place].  Si tiene entre 65 y 42 aos y es fumador o Insurance underwriter, pregntele al mdico si debe realizarse una prueba Garrett deteccin Garrett aneurisma artico abdominal (AAA) por nica vez. Diabetes Realcese exmenes Garrett deteccin Garrett la diabetes con regularidad. Este anlisis revisa el nivel Garrett azcar en la sangre en [place]. Hgase las pruebas Garrett deteccin:  Cada tresaos despus Garrett los 45aos Garrett edad si tiene un peso normal y un bajo riesgo Garrett padecer diabetes.  Con ms frecuencia y a partir Garrett [place] edad inferior si tiene sobrepeso o un alto riesgo Garrett padecer diabetes. Qu debo saber sobre la prevencin Garrett infecciones? Hepatitis B Si tiene un riesgo ms alto Garrett contraer hepatitis B, debe someterse a un examen Garrett deteccin Garrett este virus. Hable con el mdico para averiguar si tiene riesgo Garrett contraer la infeccin por hepatitis B. Hepatitis C Se recomienda un anlisis Garrett [place] para:  Todos los que nacieron entre 1945 y [phone].  Todas las personas que tengan un riesgo Garrett haber contrado hepatitis C. Enfermedades Garrett transmisin sexual (ETS)  Debe realizarse pruebas Garrett deteccin Garrett ITS todos los aos, incluidas la  gonorrea y la clamidia, si: ? Es sexualmente activo y es menor Garrett 24aos. ? Es mayor Garrett 22aos, y Public affairs consultant informa que corre riesgo Garrett tener este tipo Garrett infecciones. ? La actividad sexual ha cambiado desde que le hicieron la ltima prueba Garrett deteccin y tiene un riesgo mayor Garrett Warehouse manager clamidia o Copy. Pregntele al mdico si  usted [employer].  Pregntele al mdico si usted tiene un alto riesgo Garrett Primary school teacher VIH. El mdico tambin puede recomendarle un medicamento recetado para ayudar a evitar la infeccin por el VIH. Si elige tomar medicamentos para prevenir el VIH, primero debe [employer] Garrett deteccin del VIH. Luego debe hacerse anlisis cada mientras est tomando los medicamentos. Siga estas instrucciones en su casa: Estilo Garrett vida  No consuma ningn producto que contenga nicotina o tabaco, como cigarrillos, cigarrillos electrnicos y tabaco Garrett Theatre manager. Si necesita ayuda para dejar Garrett fumar, consulte al mdico.  No consuma drogas.  No comparta agujas.  Solicite ayuda a su mdico si necesita apoyo o informacin para abandonar las drogas. Consumo Garrett alcohol  No beba alcohol si el mdico se lo prohbe.  Si bebe alcohol: ? Limite la cantidad que consume Garrett 0 a 2 medidas por da. ? Est atento a la cantidad Garrett alcohol que hay en las bebidas que toma. En los [place], una medida equivale a una botella Garrett cerveza Garrett 12oz ( ), un vaso Garrett vino Garrett 5oz ( ) o un vaso Garrett una bebida alcohlica Garrett alta graduacin Garrett 1oz (29ml). Instrucciones generales  Realcese los estudios Garrett rutina Garrett la salud, dentales y Garrett Wellsite geologist.  Mantngase al da con las vacunas.  Infrmele a su mdico si: ? Se siente deprimido con frecuencia. ? Alguna vez ha sido vctima Garrett [place] o no se siente seguro en su casa. Resumen  Adoptar un estilo Garrett vida saludable y recibir atencin preventiva son importantes para promover la salud y Counsellor.  Siga las instrucciones  del mdico acerca Garrett una dieta saludable, el ejercicio y la realizacin Garrett pruebas o exmenes para Hotel manager.  Siga las instrucciones del mdico con respecto al control del colesterol y la presin arterial. Esta informacin no tiene Theme park manager el consejo del mdico. Asegrese Garrett hacerle al mdico cualquier pregunta que tenga. Document Revised: 02/22/2018 Document Reviewed: 02/22/2018 Elsevier Patient Education  2021 Elsevier Inc.      Edwina Barth, MD [facility]

## 2020-07-08 NOTE — Patient Instructions (Signed)
Mantenimiento de la salud en los hombres Health Maintenance, Male Adoptar un estilo de vida saludable y recibir atencin preventiva son importantes para promover la salud y el bienestar. Consulte al mdico sobre:  El esquema adecuado para hacerse pruebas y exmenes peridicos.  Cosas que puede hacer por su cuenta para prevenir enfermedades y mantenerse sano. Qu debo saber sobre la dieta, el peso y el ejercicio? Consuma una dieta saludable  Consuma una dieta que incluya muchas verduras, frutas, productos lcteos con bajo contenido de grasa y protenas magras.  No consuma muchos alimentos ricos en grasas slidas, azcares agregados o sodio.   Mantenga un peso saludable El ndice de masa muscular (IMC) es una medida que puede utilizarse para identificar posibles problemas de peso. Proporciona una estimacin de la grasa corporal basndose en el peso y la altura. Su mdico puede ayudarle a determinar su IMC y a lograr o mantener un peso saludable. Haga ejercicio con regularidad Haga ejercicio con regularidad. Esta es una de las prcticas ms importantes que puede hacer por su salud. La mayora de los adultos deben seguir estas pautas:  Realizar, al menos, 150minutos de actividad fsica por semana. El ejercicio debe aumentar la frecuencia cardaca y hacerlo transpirar (ejercicio de intensidad moderada).  Hacer ejercicios de fortalecimiento por lo menos dos veces por semana. Agregue esto a su plan de ejercicio de intensidad moderada.  Pasar menos tiempo sentados. Incluso la actividad fsica ligera puede ser beneficiosa. Controle sus niveles de colesterol y lpidos en la sangre Comience a realizarse anlisis de lpidos y colesterol en la sangre a los 20aos y luego reptalos cada 5aos. Es posible que necesite controlar los niveles de colesterol con mayor frecuencia si:  Sus niveles de lpidos y colesterol son altos.  Es mayor de 40aos.  Presenta un alto riesgo de padecer enfermedades  cardacas. Qu debo saber sobre las pruebas de deteccin del cncer? Muchos tipos de cncer pueden detectarse de manera temprana y, a menudo, pueden prevenirse. Segn su historia clnica y sus antecedentes familiares, es posible que deba realizarse pruebas de deteccin del cncer en diferentes edades. Esto puede incluir pruebas de deteccin de lo siguiente:  Cncer colorrectal.  Cncer de prstata.  Cncer de piel.  Cncer de pulmn. Qu debo saber sobre la enfermedad cardaca, la diabetes y la hipertensin arterial? Presin arterial y enfermedad cardaca  La hipertensin arterial causa enfermedades cardacas y aumenta el riesgo de accidente cerebrovascular. Es ms probable que esto se manifieste en las personas que tienen lecturas de presin arterial alta, tienen ascendencia africana o tienen sobrepeso.  Hable con el mdico sobre sus valores de presin arterial deseados.  Hgase controlar la presin arterial: ? Cada 3 a 5 aos si tiene entre 18 y 39 aos. ? Todos los aos si es mayor de 40aos.  Si tiene entre 65 y 75 aos y es fumador o sola fumar, pregntele al mdico si debe realizarse una prueba de deteccin de aneurisma artico abdominal (AAA) por nica vez. Diabetes Realcese exmenes de deteccin de la diabetes con regularidad. Este anlisis revisa el nivel de azcar en la sangre en ayunas. Hgase las pruebas de deteccin:  Cada tresaos despus de los 45aos de edad si tiene un peso normal y un bajo riesgo de padecer diabetes.  Con ms frecuencia y a partir de una edad inferior si tiene sobrepeso o un alto riesgo de padecer diabetes. Qu debo saber sobre la prevencin de infecciones? Hepatitis B Si tiene un riesgo ms alto de contraer hepatitis B, debe   someterse a un examen de deteccin de este virus. Hable con el mdico para averiguar si tiene riesgo de contraer la infeccin por hepatitis B. Hepatitis C Se recomienda un anlisis de sangre para:  Todos los que  nacieron entre 1945 y 1965.  Todas las personas que tengan un riesgo de haber contrado hepatitis C. Enfermedades de transmisin sexual (ETS)  Debe realizarse pruebas de deteccin de ITS todos los aos, incluidas la gonorrea y la clamidia, si: ? Es sexualmente activo y es menor de 24aos. ? Es mayor de 24aos, y el mdico le informa que corre riesgo de tener este tipo de infecciones. ? La actividad sexual ha cambiado desde que le hicieron la ltima prueba de deteccin y tiene un riesgo mayor de tener clamidia o gonorrea. Pregntele al mdico si usted tiene riesgo.  Pregntele al mdico si usted tiene un alto riesgo de contraer VIH. El mdico tambin puede recomendarle un medicamento recetado para ayudar a evitar la infeccin por el VIH. Si elige tomar medicamentos para prevenir el VIH, primero debe hacerse los anlisis de deteccin del VIH. Luego debe hacerse anlisis cada 3meses mientras est tomando los medicamentos. Siga estas instrucciones en su casa: Estilo de vida  No consuma ningn producto que contenga nicotina o tabaco, como cigarrillos, cigarrillos electrnicos y tabaco de mascar. Si necesita ayuda para dejar de fumar, consulte al mdico.  No consuma drogas.  No comparta agujas.  Solicite ayuda a su mdico si necesita apoyo o informacin para abandonar las drogas. Consumo de alcohol  No beba alcohol si el mdico se lo prohbe.  Si bebe alcohol: ? Limite la cantidad que consume de 0 a 2 medidas por da. ? Est atento a la cantidad de alcohol que hay en las bebidas que toma. En los [place], una medida equivale a una botella de cerveza de 12oz (355ml), un vaso de vino de 5oz (148ml) o un vaso de una bebida alcohlica de alta graduacin de 1oz (44ml). Instrucciones generales  Realcese los estudios de rutina de la salud, dentales y de la vista.  Mantngase al da con las vacunas.  Infrmele a su mdico si: ? Se siente deprimido con frecuencia. ? Alguna vez  ha sido vctima de maltrato o no se siente seguro en su casa. Resumen  Adoptar un estilo de vida saludable y recibir atencin preventiva son importantes para promover la salud y el bienestar.  Siga las instrucciones del mdico acerca de una dieta saludable, el ejercicio y la realizacin de pruebas o exmenes para detectar enfermedades.  Siga las instrucciones del mdico con respecto al control del colesterol y la presin arterial. Esta informacin no tiene como fin reemplazar el consejo del mdico. Asegrese de hacerle al mdico cualquier pregunta que tenga. Document Revised: 02/22/2018 Document Reviewed: 02/22/2018 Elsevier Patient Education  2021 Elsevier Inc.  

## 2020-07-08 NOTE — Assessment & Plan Note (Signed)
Well-controlled hypertension with normal blood pressure readings at home. Patient taking lisinopril 10 mg daily.  May be able to wean off medication and eventually stop it altogether.  Advised to continue monitoring blood pressure readings and keep a log before discontinuing medication. Diet and nutrition discussed.

## 2020-08-14 ENCOUNTER — Telehealth: Payer: Self-pay | Admitting: Emergency Medicine

## 2020-08-14 NOTE — Telephone Encounter (Signed)
1.Medication Requested: lisinopril (ZESTRIL) 10 MG tablet   2. Pharmacy (Name, Street, [place]): [facility] [phone] - [place], [place] - [address] AT [facility] [address]  3. On Med List: yes   4. Last Visit with PCP: 07-08-20  5. Next visit date with PCP: 01-15-21   Agent: Please be advised that RX refills may take up to 3 business days. We ask that you follow-up with your pharmacy.

## 2020-08-15 ENCOUNTER — Other Ambulatory Visit: Payer: Self-pay

## 2020-08-15 DIAGNOSIS — I10 Essential (primary) hypertension: Secondary | ICD-10-CM

## 2020-08-15 MED ORDER — LISINOPRIL 10 MG PO TABS: ORAL_TABLET | ORAL | 1 refills | Status: DC

## 2020-08-15 NOTE — Telephone Encounter (Signed)
Refilled medication

## 2020-08-15 NOTE — Telephone Encounter (Signed)
   Patient called and is requesting a refill. Please advise

## 2020-08-15 NOTE — Telephone Encounter (Signed)
Medication refilled

## 2020-08-15 NOTE — Telephone Encounter (Signed)
Follow up message   Patient calling to report he is out of medication. He is upset because pharmacy says they faxed request to office on Monday No fax from Monday located  Please advise

## 2020-10-08 ENCOUNTER — Telehealth (INDEPENDENT_AMBULATORY_CARE_PROVIDER_SITE_OTHER): Payer: Managed Care, Other (non HMO) | Admitting: Emergency Medicine

## 2020-10-08 ENCOUNTER — Encounter: Payer: Self-pay | Admitting: Emergency Medicine

## 2020-10-08 DIAGNOSIS — K529 Noninfective gastroenteritis and colitis, unspecified: Secondary | ICD-10-CM | POA: Diagnosis not present

## 2020-10-08 DIAGNOSIS — R109 Unspecified abdominal pain: Secondary | ICD-10-CM

## 2020-10-08 MED ORDER — DICYCLOMINE HCL 20 MG PO TABS: 20.0000 mg | ORAL_TABLET | Freq: Three times a day (TID) | ORAL | 1 refills | Status: DC

## 2020-10-08 NOTE — Progress Notes (Signed)
Telemedicine Encounter- SOAP NOTE Established Patient MyChart video encounter Patient: Home  Provider: Office   Patient present only  This video telephone encounter was conducted with the patient's (or proxy's) verbal consent via video audio telecommunications: yes/no: Yes Patient was instructed to have this encounter in a suitably private space; and to only have persons present to whom they give permission to participate. In addition, patient identity was confirmed by use of name plus two identifiers (DOB and address).  I discussed the limitations, risks, security and privacy concerns of performing an evaluation and management service by telephone and the availability of in person appointments. I also discussed with the patient that there may be a patient responsible charge related to this service. The patient expressed understanding and agreed to proceed.  I spent a total of TIME; 0 MIN TO 60 MIN: 20 minutes talking with the patient or their proxy.  Chief complaint: Abdominal cramping and diarrhea  Subjective   Garrett Miller is a 35 y.o. male established patient. Telephone visit today intermittent diffuse abdominal cramping along with watery nonbloody diarrhea that started last Saturday after eating [employer] sandwich.  Feels better today than 48 hours ago.  Diarrhea is much improved but still having occasional bad abdominal cramping.  Denies fever or chills.  Able to eat and drink.  Denies nausea or vomiting.  Denies any other significant associated symptomatology.  HPI   Patient Active Problem List   Diagnosis Date Noted   Chronic superficial gastritis without bleeding 12/06/2018   Essential hypertension 02/10/2015   HTN (hypertension) 08/22/2013    Past Medical History:  Diagnosis Date   Hypertension     Current Outpatient Medications  Medication Sig Dispense Refill   acetic acid-hydrocortisone (VOSOL-HC) OTIC solution Place 3 drops into the right ear 3 (three) times daily.  10 mL 1   lisinopril (ZESTRIL) 10 MG tablet TAKE 1 TABLET(10 MG) BY MOUTH DAILY 90 tablet 1   No current facility-administered medications for this visit.    No Known Allergies  Social History   Socioeconomic History   Marital status: Single    Spouse name: Not on file   Number of children: 2   Years of education: Not on file   Highest education level: Not on file  Occupational History   Occupation: Warehouse  Tobacco Use   Smoking status: Never   Smokeless tobacco: Never  Vaping Use   Vaping Use: Never used  Substance and Sexual Activity   Alcohol use: Yes    Alcohol/week: 0.0 standard drinks    Comment: Ociassionally/ A beer once in a while    Drug use: No   Sexual activity: Yes  Other Topics Concern   Not on file  Social History Narrative   Marital status: single; dating seriously x 10 years.  From [place]; [place] since 10 years.      Children: one child.      Employment: Naval architect.   Social Determinants of Health   Financial Resource Strain: Not on file  Food Insecurity: Not on file  Transportation Needs: Not on file  Physical Activity: Not on file  Stress: Not on file  Social Connections: Not on file  Intimate Partner Violence: Not on file    Review of Systems  Constitutional: Negative.  Negative for chills and fever.  HENT: Negative.  Negative for congestion and sore throat.   Respiratory: Negative.  Negative for cough and shortness of breath.   Cardiovascular:  Negative for chest pain and palpitations.  Gastrointestinal:  Positive for abdominal pain and diarrhea. Negative for blood in stool, melena, nausea and vomiting.  Genitourinary: Negative.  Negative for dysuria and hematuria.  Skin: Negative.  Negative for rash.  Neurological:  Negative for dizziness and headaches.  All other systems reviewed and are negative.  Objective  Alert and oriented x3 in no apparent respiratory distress Vitals as reported by the patient: There were no vitals filed for  this visit.  Diagnoses and all orders for this visit:  Abdominal cramping -     dicyclomine (BENTYL) 20 MG tablet; Take 1 tablet (20 mg total) by mouth 3 (three) times daily before meals for 2 days.  Acute gastroenteritis   Clinically stable.  No red flag signs or symptoms.  Slowly improving.  Still having occasional cramping.  Will treat with Bentyl as needed. BRAT diet recommended.  Advised to contact the office if no better or worse during the next several days.  I discussed the assessment and treatment plan with the patient. The patient was provided an opportunity to ask questions and all were answered. The patient agreed with the plan and demonstrated an understanding of the instructions.   The patient was advised to call back or seek an in-person evaluation if the symptoms worsen or if the condition fails to improve as anticipated.  I provided 20 minutes of non-face-to-face time during this encounter.  Georgina Quint, MD  Primary Care at [facility]

## 2020-12-31 ENCOUNTER — Other Ambulatory Visit: Payer: Self-pay | Admitting: Emergency Medicine

## 2020-12-31 DIAGNOSIS — I10 Essential (primary) hypertension: Secondary | ICD-10-CM

## 2021-01-15 ENCOUNTER — Ambulatory Visit: Payer: Managed Care, Other (non HMO) | Admitting: Emergency Medicine

## 2021-06-28 ENCOUNTER — Other Ambulatory Visit: Payer: Self-pay | Admitting: Emergency Medicine

## 2021-06-28 DIAGNOSIS — I10 Essential (primary) hypertension: Secondary | ICD-10-CM

## 2021-07-27 ENCOUNTER — Encounter: Payer: Self-pay | Admitting: Emergency Medicine

## 2021-07-27 ENCOUNTER — Ambulatory Visit (INDEPENDENT_AMBULATORY_CARE_PROVIDER_SITE_OTHER): Payer: Managed Care, Other (non HMO) | Admitting: Emergency Medicine

## 2021-07-27 VITALS — BP 110/68 | HR 67 | Temp 98.1°F | Ht 70.0 in | Wt 181.5 lb

## 2021-07-27 DIAGNOSIS — S39012A Strain of muscle, fascia and tendon of lower back, initial encounter: Secondary | ICD-10-CM | POA: Insufficient documentation

## 2021-07-27 DIAGNOSIS — I10 Essential (primary) hypertension: Secondary | ICD-10-CM | POA: Diagnosis not present

## 2021-07-27 MED ORDER — DICLOFENAC SODIUM 75 MG PO TBEC: 75.0000 mg | DELAYED_RELEASE_TABLET | Freq: Two times a day (BID) | ORAL | 0 refills | Status: DC

## 2021-07-27 MED ORDER — CYCLOBENZAPRINE HCL 10 MG PO TABS: 10.0000 mg | ORAL_TABLET | Freq: Three times a day (TID) | ORAL | 0 refills | Status: DC | PRN

## 2021-07-27 NOTE — Assessment & Plan Note (Signed)
Mechanical pain.  Related to mild injury. Will benefit from muscle relaxant Flexeril 10 mg 3 times daily as needed and diclofenac 75 mg twice daily as needed. Referral to sports medicine for evaluation of chronic lumbar pain and status of lumbar spine.

## 2021-07-27 NOTE — Patient Instructions (Signed)
Dolor de espalda agudo en los adultos Acute Back Pain, Adult El dolor de espalda agudo es repentino y por lo general no dura mucho tiempo. Se debe generalmente a una lesin de los msculos y tejidos de la espalda. La lesin puede ser el resultado de: Estiramiento en exceso o desgarro de un msculo, tendn o ligamento. Los ligamentos son tejidos que conectan los huesos. Levantar algo de forma incorrecta puede producir un esguince de espalda. Desgaste (degeneracin) de los discos vertebrales. Los discos vertebrales son tejidos circulares que proporcionan amortiguacin entre los huesos de la columna vertebral (vrtebras). Movimientos de giro, como al practicar deportes o realizar trabajos de jardinera. Un golpe en la espalda. Artritis. Es posible que le realicen un examen fsico, anlisis de laboratorio u otros estudios de diagnstico por imgenes para encontrar la causa del dolor. El dolor de espalda agudo generalmente desaparece con reposo y cuidados en la casa. Siga estas instrucciones en su casa: Control del dolor, la rigidez y la hinchazn Use los medicamentos de venta libre y los recetados solamente como se lo haya indicado el mdico. El tratamiento puede incluir medicamentos para el dolor y la inflamacin que se toman por la boca o que se aplican sobre la piel, o relajantes musculares. El mdico puede recomendarle que se aplique hielo durante las primeras 24 a 48 horas despus del comienzo del dolor. Para hacer esto: Ponga el hielo en una bolsa plstica. Coloque una toalla entre la piel y la bolsa. Aplique el hielo durante 20 minutos, 2 o 3 veces por da. Retire el hielo si la piel se pone de color rojo brillante. Esto es muy importante. Si no puede sentir dolor, calor o fro, tiene un mayor riesgo de que se dae la zona. Si se lo indican, aplique calor en la zona afectada con la frecuencia que le haya indicado el mdico. Use la fuente de calor que el mdico le recomiende, como una compresa de  calor hmedo o una almohadilla trmica. Coloque una toalla entre la piel y la fuente de calor. Aplique calor durante 20 a 30 minutos. Retire la fuente de calor si la piel se pone de color rojo brillante. Esto es especialmente importante si no puede sentir dolor, calor o fro. Corre un mayor riesgo de sufrir quemaduras. Actividad  No permanezca en la cama. Hacer reposo en la cama por ms de 1 a 2 das puede demorar su recuperacin. Mantenga una buena postura al sentarse y pararse. No se incline hacia adelante al sentarse ni se encorve al pararse. Si trabaja en un escritorio, sintese cerca de este para no tener que inclinarse. Mantenga el mentn hacia abajo. Mantenga el cuello hacia atrs y los codos flexionados en un ngulo de 90 grados (ngulo recto). Cuando conduzca, sintese elevado y cerca del volante. Agregue un apoyo para la espalda (lumbar) al asiento del automvil, si es necesario. Realice caminatas cortas en superficies planas tan pronto como le sea posible. Trate de caminar un poco ms de tiempo cada da. No se siente, conduzca o permanezca de pie en un mismo lugar durante ms de 30 minutos seguidos. Pararse o sentarse durante largos perodos de tiempo puede sobrecargar la espalda. No conduzca ni use maquinaria pesada mientras toma analgsicos recetados. Use tcnicas apropiadas para levantar objetos. Cuando se inclina y levanta un objeto, utilice posiciones que no sobrecarguen tanto la espalda: Flexione las rodillas. Mantenga la carga cerca del cuerpo. No se tuerza. Haga actividad fsica habitualmente como se lo haya indicado el mdico. Hacer ejercicios ayuda   a que la espalda sane ms rpido y ayuda a evitar las lesiones de la espalda al mantener los msculos fuertes y flexibles. Trabaje con un fisioterapeuta para crear un programa de ejercicios seguros, segn lo recomiende el mdico. Haga ejercicios como se lo haya indicado el fisioterapeuta. Estilo de vida Mantenga un peso saludable.  El sobrepeso sobrecarga la espalda y hace que resulte difcil tener una buena postura. Evite actividades o situaciones que lo hagan sentirse ansioso o estresado. El estrs y la ansiedad aumentan la tensin muscular y pueden empeorar el dolor de espalda. Aprenda formas de manejar la ansiedad y el estrs, como a travs del ejercicio. Instrucciones generales [place] sobre un colchn firme en una posicin cmoda. Intente acostarse de costado, con las rodillas ligeramente flexionadas. Si se recuesta sobre la espalda, coloque una almohada debajo de las rodillas. Mantenga la cabeza y el cuello en lnea recta con la columna vertebral (posicin neutra) cuando use equipos electrnicos como telfonos inteligentes o tablets. Para hacer esto: Levante el telfono inteligente o la tablet para mirarlo en lugar de inclinar la cabeza o el cuello para mirar hacia abajo. Coloque el telfono inteligente o la tablet al nivel de su cara mientras mira la pantalla. Siga el plan de tratamiento como se lo haya indicado el mdico. Esto puede incluir: Terapia cognitiva o conductual. Acupuntura o terapia de masajes. Yoga o meditacin. Comunquese con un mdico si: Siente un dolor que no se alivia con reposo o medicamentos. Siente mucho dolor que se extiende a las piernas o las nalgas. El dolor no mejora luego de 2 semanas. Siente dolor por la noche. Pierde peso sin proponrselo. Tiene fiebre o escalofros. Siente nuseas o vmitos. Siente dolor abdominal. Solicite ayuda de inmediato si: Tiene nuevos problemas para controlar la vejiga o los intestinos. Siente debilidad o adormecimiento inusuales en los brazos o en las piernas. Siente que va a desmayarse. Estos sntomas pueden representar un problema grave que constituye una emergencia. No espere a ver si los sntomas desaparecen. Solicite atencin mdica de inmediato. Comunquese con el servicio de emergencias de su localidad (911 en los [place]). No conduzca por sus  propios medios hasta el hospital. Resumen El dolor de espalda agudo es repentino y por lo general no dura mucho tiempo. Use tcnicas apropiadas para levantar objetos. Cuando se inclina y levanta un objeto, utilice posiciones que no sobrecarguen tanto la espalda. Tome los medicamentos de venta libre y los recetados solamente como se lo haya indicado el mdico, y aplquese calor o hielo segn las indicaciones. Esta informacin no tiene como fin reemplazar el consejo del mdico. Asegrese de hacerle al mdico cualquier pregunta que tenga. Document Revised: 05/21/2020 Document Reviewed: 05/21/2020 Elsevier Patient Education  2023 [employer]  

## 2021-07-27 NOTE — Assessment & Plan Note (Signed)
Well-controlled hypertension.  Continue lisinopril 10 mg daily. BP Readings from Last 3 Encounters:  07/27/21 110/68  07/08/20 136/68  10/18/19 130/64

## 2021-07-27 NOTE — Progress Notes (Signed)
Garrett Miller 36 y.o.   Chief Complaint  Patient presents with   Back Pain    Off and on back pain for a few months    HISTORY OF PRESENT ILLNESS: This is a 36 y.o. male with history of intermittent chronic lumbar pain, lifted heavy object yesterday and started hurting right away in the lumbar area.  Sharp pain worse with movement without radiation and not associated with any other symptoms.  Denies bowel or bladder issues.  Denies leg tingling, numbness or weakness. No other complaints or medical concerns today.  Back Pain Pertinent negatives include no abdominal pain, chest pain, dysuria, fever or headaches.     Prior to Admission medications   Medication Sig Start Date End Date Taking? Authorizing Provider  cyclobenzaprine (FLEXERIL) 10 MG tablet Take 1 tablet (10 mg total) by mouth 3 (three) times daily as needed for muscle spasms. 07/27/21  Yes Vennessa Affinito, Eilleen KempfMiguel Jose, MD  diclofenac (VOLTAREN) 75 MG EC tablet Take 1 tablet (75 mg total) by mouth 2 (two) times daily. 07/27/21  Yes Mindee Robledo, Eilleen KempfMiguel Jose, MD  lisinopril (ZESTRIL) 10 MG tablet TAKE 1 TABLET(10 MG) BY MOUTH DAILY 06/28/21  Yes Tayten Heber, Eilleen KempfMiguel Jose, MD  acetic acid-hydrocortisone (VOSOL-HC) OTIC solution Place 3 drops into the right ear 3 (three) times daily. Patient not taking: Reported on 07/27/2021 07/08/20   Georgina QuintSagardia, Cliford Sequeira Jose, MD  dicyclomine (BENTYL) 20 MG tablet Take 1 tablet (20 mg total) by mouth 3 (three) times daily before meals for 2 days. 10/08/20 10/10/20  Georgina QuintSagardia, Stephinie Battisti Jose, MD    No Known Allergies  Patient Active Problem List   Diagnosis Date Noted   Acute lumbar myofascial strain 07/27/2021   Chronic superficial gastritis without bleeding 12/06/2018   Essential hypertension 02/10/2015   HTN (hypertension) 08/22/2013    Past Medical History:  Diagnosis Date   Hypertension     Past Surgical History:  Procedure Laterality Date   CHOLECYSTECTOMY  2013   LAPAROSCOPIC APPENDECTOMY N/A 06/24/2015    Procedure: APPENDECTOMY LAPAROSCOPIC;  Surgeon: De BlanchLuke Aaron Kinsinger, MD;  Location: [facility] OR;  Service: General;  Laterality: N/A;    Social History   Socioeconomic History   Marital status: Single    Spouse name: Not on file   Number of children: 2   Years of education: Not on file   Highest education level: Not on file  Occupational History   Occupation: Warehouse  Tobacco Use   Smoking status: Never   Smokeless tobacco: Never  Vaping Use   Vaping Use: Never used  Substance and Sexual Activity   Alcohol use: Yes    Alcohol/week: 0.0 standard drinks of alcohol    Comment: Ociassionally/ A beer once in a while    Drug use: No   Sexual activity: Yes  Other Topics Concern   Not on file  Social History Narrative   Marital status: single; dating seriously x 10 years.  From [place] since 10 [place].      Children: one child.      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   Social Determinants of Health   Financial Resource Strain: Not on file  Food Insecurity: Not on file  Transportation Needs: Not on file  Physical Activity: Not on file  Stress: Not on file  Social Connections: Not on file  Intimate Partner Violence: Not on file    Family History  Problem Relation Age of Onset   Hypertension Mother    Colon cancer Neg Hx    Esophageal cancer Neg  Hx      Review of Systems  Constitutional: Negative.  Negative for chills and fever.  HENT: Negative.  Negative for congestion and sore throat.   Respiratory: Negative.  Negative for cough and shortness of breath.   Cardiovascular: Negative.  Negative for chest pain and palpitations.  Gastrointestinal:  Negative for abdominal pain, diarrhea, nausea and vomiting.  Genitourinary: Negative.  Negative for dysuria and hematuria.  Musculoskeletal:  Positive for back pain.  Skin: Negative.  Negative for rash.  Neurological:  Negative for dizziness, sensory change, focal weakness and headaches.  All other systems reviewed and are  negative.  Today's Vitals   07/27/21 1539  BP: 110/68  Pulse: 67  Temp: 98.1 F (36.7 C)  TempSrc: Oral  SpO2: 98%  Weight: 181 lb 8 oz (82.3 kg)  Height:  (1.778 m)   Body mass index is 26.04 kg/m.   Physical Exam Vitals reviewed.  Constitutional:      Appearance: Normal appearance.  HENT:     Head: Normocephalic.  Eyes:     Extraocular Movements: Extraocular movements intact.     Pupils: Pupils are equal, round, and reactive to light.  Cardiovascular:     Rate and Rhythm: Normal rate and regular rhythm.     Pulses: Normal pulses.     Heart sounds: Normal heart sounds.  Pulmonary:     Effort: Pulmonary effort is normal.     Breath sounds: Normal breath sounds.  Abdominal:     Palpations: Abdomen is soft.     Tenderness: There is no abdominal tenderness.  Musculoskeletal:     Cervical back: No tenderness.     Lumbar back: Spasms and tenderness present. No bony tenderness. Decreased range of motion. Negative right straight leg raise test and negative left straight leg raise test.  Lymphadenopathy:     Cervical: No cervical adenopathy.  Skin:    Capillary Refill: Capillary refill takes less than 2 seconds.  Neurological:     General: No focal deficit present.     Mental Status: He is alert and oriented to person, place, and time.  Psychiatric:        Mood and Affect: Mood normal.        Behavior: Behavior normal.      ASSESSMENT & PLAN: Problem List Items Addressed This Visit       Cardiovascular and Mediastinum   Essential hypertension    Well-controlled hypertension.  Continue lisinopril 10 mg daily. BP Readings from Last 3 Encounters:  07/27/21 110/68  07/08/20 136/68  10/18/19 130/64           Musculoskeletal and Integument   Acute lumbar myofascial strain - Primary    Mechanical pain.  Related to mild injury. Will benefit from muscle relaxant Flexeril 10 mg 3 times daily as needed and diclofenac 75 mg twice daily as needed. Referral to  sports medicine for evaluation of chronic lumbar pain and status of lumbar spine.      Relevant Medications   cyclobenzaprine (FLEXERIL) 10 MG tablet   diclofenac (VOLTAREN) 75 MG EC tablet   Other Relevant Orders   Ambulatory referral to Sports Medicine   Patient Instructions  Dolor de espalda agudo en los adultos Acute Back Pain, Adult El dolor de espalda agudo es repentino y por lo general no dura mucho tiempo. Se debe generalmente a una lesin de los msculos y tejidos de la espalda. La lesin puede ser el resultado de: Estiramiento en exceso o desgarro de un  msculo, tendn o ligamento. Los ligamentos son tejidos que [employer]. Levantar algo de forma incorrecta puede producir un esguince de espalda. Desgaste (degeneracin) de los discos vertebrales. Los discos vertebrales son tejidos circulares que proporcionan amortiguacin entre los huesos de la columna vertebral (vrtebras). Movimientos de giro, como al practicar deportes o realizar trabajos de [place]. Un golpe en la espalda. Artritis. Es posible Producer, television/film/video un examen fsico, anlisis de laboratorio u otros estudios de diagnstico por imgenes para Veterinary surgeon causa del Engineer, mining. El dolor de espalda agudo generalmente desaparece con reposo y cuidados en la casa. Siga estas instrucciones en su casa: Control del dolor, la rigidez y Architect los medicamentos de venta libre y los recetados solamente como se lo haya indicado el mdico. El tratamiento puede incluir medicamentos para Chief Technology Officer y la inflamacin que se toman por la boca o que se aplican sobre la piel, o relajantes musculares. El mdico puede recomendarle que se aplique hielo durante las primeras 24 a 48 horas despus del comienzo del Engineer, mining. Para hacer esto: Ponga el hielo en una bolsa plstica. Coloque una toalla entre la piel y Copy. Aplique el hielo durante 20 minutos, 2 o 3 veces por da. Retire el hielo si la piel se pone de color rojo  brillante. Esto es Intel. Si no puede sentir dolor, calor o fro, tiene un mayor riesgo de que se dae la zona. Si se lo indican, aplique calor en la zona afectada con la frecuencia que le haya indicado el mdico. Use la fuente de calor que el mdico le recomiende, como una compresa de calor hmedo o una almohadilla trmica. Coloque una toalla entre la piel y la fuente de Airline pilot. Aplique calor durante 20 a 30 minutos. Retire la fuente de calor si la piel se pone de color rojo brillante. Esto es especialmente importante si no puede sentir dolor, calor o fro. Corre un mayor riesgo de sufrir quemaduras. Actividad  No permanezca en la cama. Hacer reposo en la cama por ms de 1 a 2 das puede demorar su recuperacin. Mantenga una buena postura al sentarse y pararse. No se incline hacia adelante al sentarse ni se encorve al pararse. Si trabaja en un escritorio, sintese cerca de este para no tener que inclinarse. Mantenga el mentn hacia abajo. Mantenga el cuello hacia atrs y los codos flexionados en un ngulo de 90 grados (ngulo recto). Cuando conduzca, sintese elevado y cerca del volante. Agregue un apoyo para la espalda (lumbar) al asiento del automvil, si es necesario. Realice caminatas cortas en superficies planas tan pronto como le sea posible. Trate de caminar un poco ms de Pharmacist, community. No se siente, conduzca o permanezca de pie en un mismo lugar durante ms de 30 minutos seguidos. Pararse o sentarse durante largos perodos de Contractor la espalda. No conduzca ni use maquinaria pesada mientras toma analgsicos recetados. Use tcnicas apropiadas para levantar objetos. Cuando se inclina y Solicitor un [place], utilice posiciones que no sobrecarguen tanto la espalda: Flexione las rodillas. Mantenga la carga cerca del cuerpo. No se tuerza. Haga actividad fsica habitualmente como se lo haya indicado el mdico. Hacer ejercicios ayuda a que la espalda sane ms rpido y  [place] a Automotive engineer las lesiones de la espalda al [employer] msculos fuertes y flexibles. Trabaje con un fisioterapeuta para crear un programa de ejercicios seguros, segn lo recomiende el mdico. Haga ejercicios como se lo haya indicado el fisioterapeuta. Estilo de vida Mantenga un  peso saludable. El sobrepeso sobrecarga la espalda y hace que resulte difcil tener una buena [place]. Evite actividades o situaciones que lo hagan sentirse ansioso o estresado. El estrs y la ansiedad aumentan la tensin muscular y pueden empeorar el dolor de espalda. Aprenda formas de [employer] ansiedad y [place], como a travs del ejercicio. Instrucciones generales [place] sobre un colchn firme en una posicin cmoda. Intente acostarse de costado, con las rodillas ligeramente flexionadas. Si se recuesta Fisher [employer], coloque una almohada debajo de las rodillas. Mantenga la cabeza y el cuello en lnea recta con la columna vertebral (posicin neutra) cuando use equipos electrnicos como telfonos inteligentes o tablets. Para hacer esto: Levante el telfono inteligente o la tablet para Education officer, community de inclinar la cabeza o el cuello para mirar hacia abajo. Coloque el telfono inteligente o la tablet al nivel de su cara mientras mira la pantalla. Siga el plan de tratamiento como se lo haya indicado el mdico. Esto puede incluir: Terapia cognitiva o conductual. Acupuntura o terapia de masajes. Yoga o meditacin. Comunquese con un mdico si: Siente un dolor que no se alivia con reposo o medicamentos. Siente mucho dolor que se extiende a las piernas o las nalgas. El dolor no mejora luego de 2 semanas. Siente dolor por la noche. Pierde peso sin proponrselo. Tiene fiebre o escalofros. Siente nuseas o vmitos. Siente dolor abdominal. Solicite ayuda de inmediato si: Tiene nuevos problemas para controlar la vejiga o los intestinos. Siente debilidad o adormecimiento inusuales en los brazos o en las  piernas. Siente que va a desmayarse. Estos sntomas pueden representar un problema grave que constituye Radio broadcast assistant. No espere a ver si los sntomas desaparecen. Solicite atencin mdica de inmediato. Comunquese con el servicio de emergencias de su localidad (911 en los [place]). No conduzca por sus propios medios [employer]. Resumen El dolor de espalda agudo es repentino y por lo general no dura mucho tiempo. Use tcnicas apropiadas para levantar objetos. Cuando se inclina y levanta un [place], utilice posiciones que no sobrecarguen tanto la espalda. Tome los medicamentos de venta libre y los recetados solamente como se lo haya indicado el mdico, y aplquese calor o hielo segn las indicaciones. Esta informacin no tiene Theme park manager el consejo del mdico. Asegrese de hacerle al mdico cualquier pregunta que tenga. Document Revised: 05/21/2020 Document Reviewed: 05/21/2020 Elsevier Patient Education  2023 Elsevier Inc.     Edwina Barth, MD [facility]

## 2021-07-29 NOTE — Progress Notes (Signed)
Garrett Miller D.[facility] [address] [place] [address] Phone: [phone]   Assessment and Plan:     1. Chronic bilateral low back pain with bilateral sciatica -Chronic with exacerbation, initial sports medicine visit - Intermittent low back pain with significant flare of pain over the past 3 days from patient lifting heavy objects at work - Patient's symptoms most consistent with lumbar musculature strain.  no red flag symptoms on physical exam with unremarkable x-ray imaging - Start meloxicam 15 mg daily x2 weeks.  If still having pain after 2 weeks, complete 3rd-week of meloxicam. May use remaining meloxicam as needed once daily for pain control.  Do not to use additional NSAIDs while taking meloxicam.  May use Tylenol [phone] mg 2 to 3 times a day for breakthrough pain. - Discontinue diclofenac twice daily as this medication was causing mild upset   stomach - Recommend cutting Flexeril 10 mg in half and using Flexeril 5 mg nightly for muscle spasms as 10 mg was making patient drowsy the next day - Start HEP.  Handout provided - X-ray obtained in clinic.  My interpretation: No acute fracture or vertebral collapse.  Mild loss of lumbar lordosis consistent with muscle spasms - DG Lumbar Spine 2-3 Views; Future    Pertinent previous records reviewed include internal medicine note 07/27/2021   Follow Up: As needed if no improvement or worsening of symptoms.  Could consider advanced imaging versus OMT   Subjective:   I, Garrett Miller, am serving as a Neurosurgeon for Doctor Richardean Sale  Chief Complaint: low back pain   HPI:   07/30/2021 Patient is a 36 year old male complaining of low back pain. Patient states chronic lumbar pain, lifted heavy object last Sunday  and started hurting right away in the lumbar area.  Sharp pain worse with movement , pain does radiate down his legs, does get some numbness and tingling going down his legs , used  flexeril for 2 days but took it at work so he didn't take it again , he isnt able to get up early for work when he takes it at night    Relevant Historical Information: Hypertension, chronic gastritis  Additional pertinent review of systems negative.   Current Outpatient Medications:    acetic acid-hydrocortisone (VOSOL-HC) OTIC solution, Place 3 drops into the right ear 3 (three) times daily., Disp: 10 mL, Rfl: 1   cyclobenzaprine (FLEXERIL) 10 MG tablet, Take 1 tablet (10 mg total) by mouth 3 (three) times daily as needed for muscle spasms., Disp: 30 tablet, Rfl: 0   diclofenac (VOLTAREN) 75 MG EC tablet, Take 1 tablet (75 mg total) by mouth 2 (two) times daily., Disp: 30 tablet, Rfl: 0   lisinopril (ZESTRIL) 10 MG tablet, TAKE 1 TABLET(10 MG) BY MOUTH DAILY, Disp: 90 tablet, Rfl: 1   meloxicam (MOBIC) 15 MG tablet, Take 1 tablet (15 mg total) by mouth daily., Disp: 30 tablet, Rfl: 0   dicyclomine (BENTYL) 20 MG tablet, Take 1 tablet (20 mg total) by mouth 3 (three) times daily before meals for 2 days., Disp: 6 tablet, Rfl: 1   Objective:     Vitals:   07/30/21 1540  BP: 120/80  Pulse: 60  SpO2: 98%  Weight: 181 lb (82.1 kg)  Height: 5\' 10"  (1.778 m)      Body mass index is 25.97 kg/m.    Physical Exam:    Gen: Appears well, nad, nontoxic and pleasant Psych: Alert  and oriented, appropriate mood and affect Neuro: sensation intact, strength is 5/5 in upper and lower extremities, muscle tone wnl Skin: no susupicious lesions or rashes  Back - Normal skin, Spine with normal alignment and no deformity.   No tenderness to vertebral process palpation.   Bilateral lumbar paraspinous muscles are moderately tender and without spasm Straight leg raise negative Trendelenberg negative  Pain worsens with lumbar extension No pain with side bending or rotation  Electronically signed by:  Garrett Miller D.[facility] 4:03 PM 07/30/21

## 2021-07-30 ENCOUNTER — Ambulatory Visit (INDEPENDENT_AMBULATORY_CARE_PROVIDER_SITE_OTHER): Payer: Managed Care, Other (non HMO) | Admitting: Sports Medicine

## 2021-07-30 ENCOUNTER — Ambulatory Visit (INDEPENDENT_AMBULATORY_CARE_PROVIDER_SITE_OTHER): Payer: Managed Care, Other (non HMO)

## 2021-07-30 VITALS — BP 120/80 | HR 60 | Ht 70.0 in | Wt 181.0 lb

## 2021-07-30 DIAGNOSIS — G8929 Other chronic pain: Secondary | ICD-10-CM

## 2021-07-30 DIAGNOSIS — M545 Low back pain, unspecified: Secondary | ICD-10-CM

## 2021-07-30 DIAGNOSIS — M5442 Lumbago with sciatica, left side: Secondary | ICD-10-CM | POA: Diagnosis not present

## 2021-07-30 DIAGNOSIS — M5441 Lumbago with sciatica, right side: Secondary | ICD-10-CM | POA: Diagnosis not present

## 2021-07-30 MED ORDER — MELOXICAM 15 MG PO TABS: 15.0000 mg | ORAL_TABLET | Freq: Every day | ORAL | 0 refills | Status: DC

## 2021-07-30 NOTE — Patient Instructions (Addendum)
Good to see you  - Start meloxicam 15 mg daily x2 weeks.  If still having pain after 2 weeks, complete 3rd-week of meloxicam. May use remaining meloxicam as needed once daily for pain control.  Do not to use additional NSAIDs while taking meloxicam.  May use Tylenol [phone] mg 2 to 3 times a day for breakthrough pain. Discontinue diclofenac Cut flexeril in half and take 5 mg for muscle spasm  Low back HEP  As needed follow up

## 2021-12-25 ENCOUNTER — Other Ambulatory Visit: Payer: Self-pay | Admitting: Emergency Medicine

## 2021-12-25 DIAGNOSIS — I10 Essential (primary) hypertension: Secondary | ICD-10-CM

## 2022-06-23 ENCOUNTER — Other Ambulatory Visit: Payer: Self-pay | Admitting: Emergency Medicine

## 2022-06-23 DIAGNOSIS — I10 Essential (primary) hypertension: Secondary | ICD-10-CM

## 2022-09-22 ENCOUNTER — Encounter: Payer: Self-pay | Admitting: Internal Medicine

## 2022-09-22 ENCOUNTER — Ambulatory Visit: Payer: Managed Care, Other (non HMO) | Admitting: Internal Medicine

## 2022-09-22 DIAGNOSIS — S39012A Strain of muscle, fascia and tendon of lower back, initial encounter: Secondary | ICD-10-CM

## 2022-09-22 MED ORDER — METHYLPREDNISOLONE ACETATE 80 MG/ML IJ SUSP
80.0000 mg | Freq: Once | INTRAMUSCULAR | Status: AC
Start: 2022-09-22 — End: 2022-09-22
  Administered 2022-09-22: 80 mg via INTRAMUSCULAR

## 2022-09-22 MED ORDER — CYCLOBENZAPRINE HCL 5 MG PO TABS: 5.0000 mg | ORAL_TABLET | Freq: Every evening | ORAL | 1 refills | Status: DC | PRN

## 2022-09-22 MED ORDER — MELOXICAM 15 MG PO TABS: 15.0000 mg | ORAL_TABLET | Freq: Every day | ORAL | 1 refills | Status: DC | PRN

## 2022-09-22 NOTE — Patient Instructions (Addendum)
    Steroid injection injection given today.    Medications changes include :   flexeril 5 mg at night as needed for back pain, meloxicam 15 mg daily with food.        Return if symptoms worsen or fail to improve.

## 2022-09-22 NOTE — Progress Notes (Signed)
Subjective:    Patient ID: Garrett Miller, male    DOB: 03-Jun-1985, 37 y.o.   MRN: [id number]      HPI Robinson is here for  Chief Complaint  Patient presents with   Back Pain    Low back pain and leg pain    Same as before - started two weeks ago.  Last weekend it was worse.  Taking tylenol and advil.    Pain in across lower back and radiates down to b/l posterior legs to knees.  Worse after getting out of bed and moving around.   Loosens up throughout day and is better, but still has pain.  No injury.  No numbness, tingling or weakness.  Extending back hurts.  Decreased flexion-both causes pain.  Pain is little better with sitting.   Medications and allergies reviewed with patient and updated if appropriate.  Current Outpatient Medications on File Prior to Visit  Medication Sig Dispense Refill   acetic acid-hydrocortisone (VOSOL-HC) OTIC solution Place 3 drops into the right ear 3 (three) times daily. 10 mL 1   lisinopril (ZESTRIL) 10 MG tablet TAKE 1 TABLET(10 MG) BY MOUTH DAILY 90 tablet 1   dicyclomine (BENTYL) 20 MG tablet Take 1 tablet (20 mg total) by mouth 3 (three) times daily before meals for 2 days. 6 tablet 1   No current facility-administered medications on file prior to visit.    Review of Systems     Objective:   Vitals:   09/22/22 1558  BP: 118/76  Pulse: 60  Temp: 98.6 F (37 C)  SpO2: 98%   BP Readings from Last 3 Encounters:  09/22/22 118/76  07/30/21 120/80  07/27/21 110/68   Wt Readings from Last 3 Encounters:  09/22/22 178 lb (80.7 kg)  07/30/21 181 lb (82.1 kg)  07/27/21 181 lb 8 oz (82.3 kg)   Body mass index is 25.54 kg/m.    Physical Exam Constitutional:      General: He is not in acute distress.    Appearance: Normal appearance. He is not ill-appearing.  HENT:     Head: Normocephalic and atraumatic.  Musculoskeletal:        General: No swelling or deformity.     Right lower leg: No edema.     Left lower leg: No edema.      Comments: Decreased lumbar flexion and extension-both cause pain.  No tenderness along lumbar spine.  No tenderness along paravertebral muscles, no SI joint tenderness  Skin:    General: Skin is warm and dry.     Findings: No rash.  Neurological:     Mental Status: He is alert.     Sensory: No sensory deficit.     Motor: No weakness.     Gait: Gait normal.            Assessment & Plan:    Acute lumbar myofascial strain: Acute on chronic Has had this chronically intermittently This episode started about 2 weeks ago Taking over-the-counter Advil and Tylenol which are not helping Pain is worse when he first gets out of bed in the morning and improves throughout the day Pain radiating down bilateral posterior legs to the knees, no numbness, tingling or weakness Depo-Medrol 80 mg IM x 1 Start Flexeril 5 mg at bedtime as needed Start meloxicam 15 mg daily with food as needed Discussed to take medications and then put them aside and use if he has flares in the future Discussed doing back exercises which  may help May need to see sports medicine again continues to have flares

## 2022-09-27 IMAGING — DX DG LUMBAR SPINE 2-3V
3 series · 3 of 3 positions shown · non-contrast
Comparison: None Available.

CLINICAL DATA: Low back pain

EXAM:
LUMBAR SPINE - 2-3 VIEW

[l-spine ap]
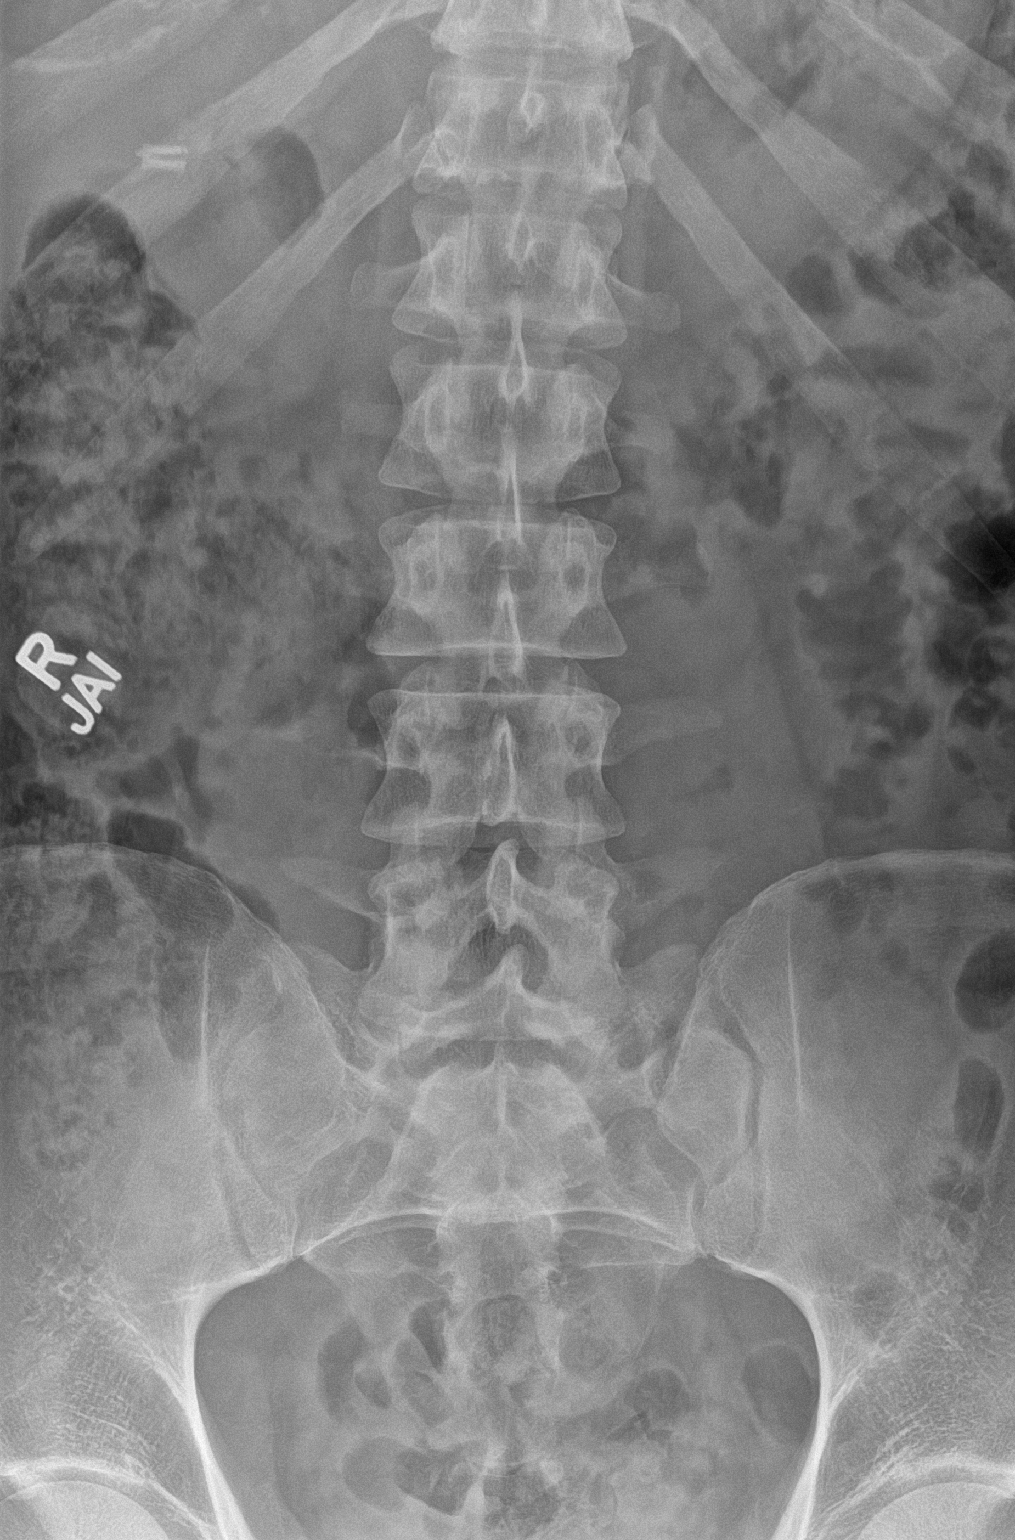

[l-spine lateral]
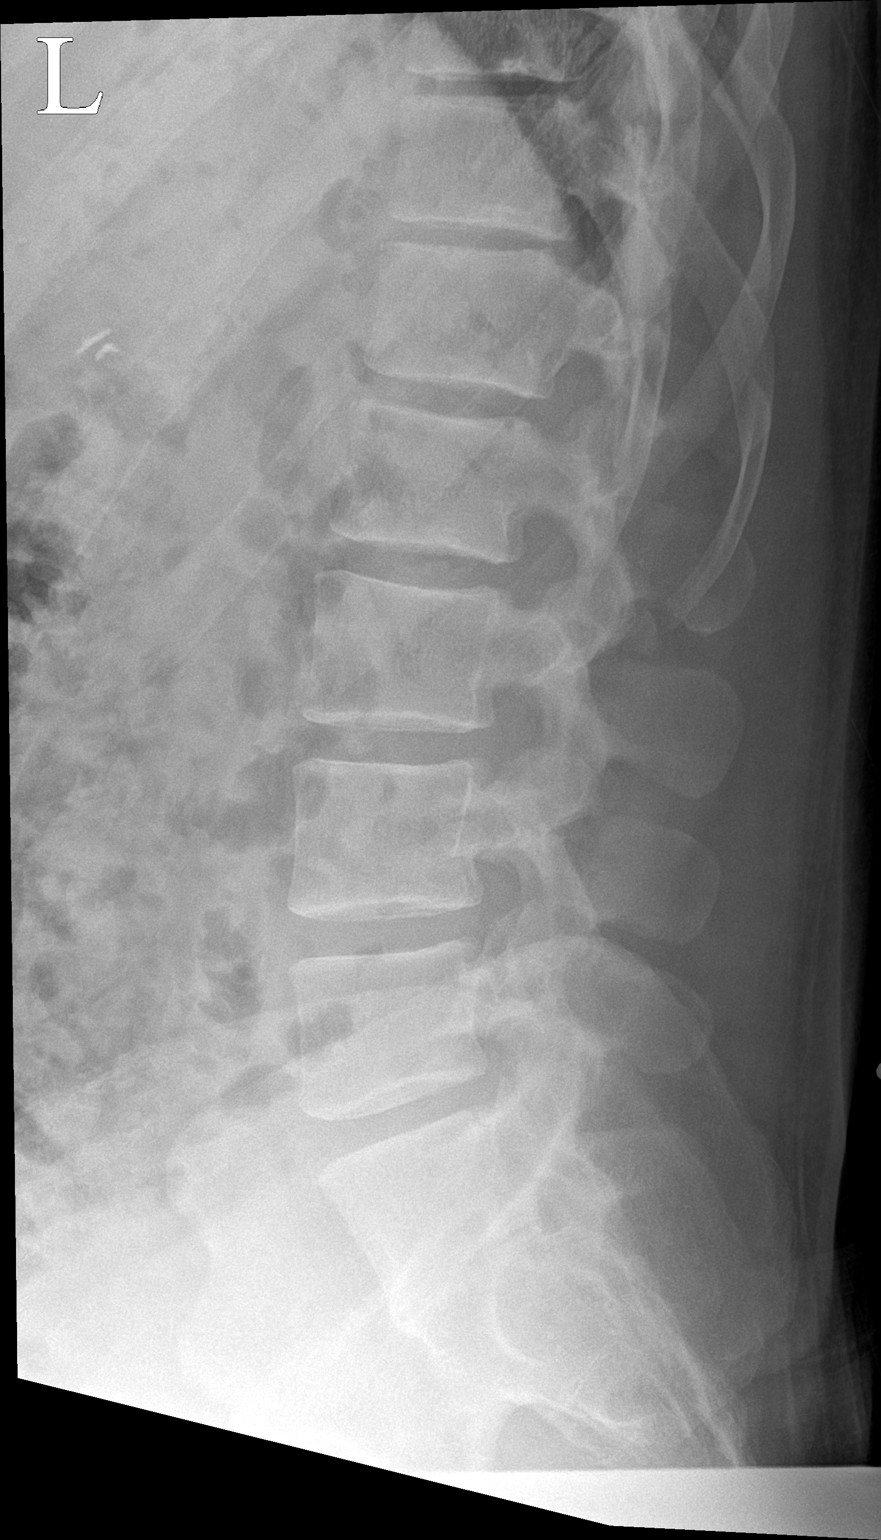

[l-spine spot]
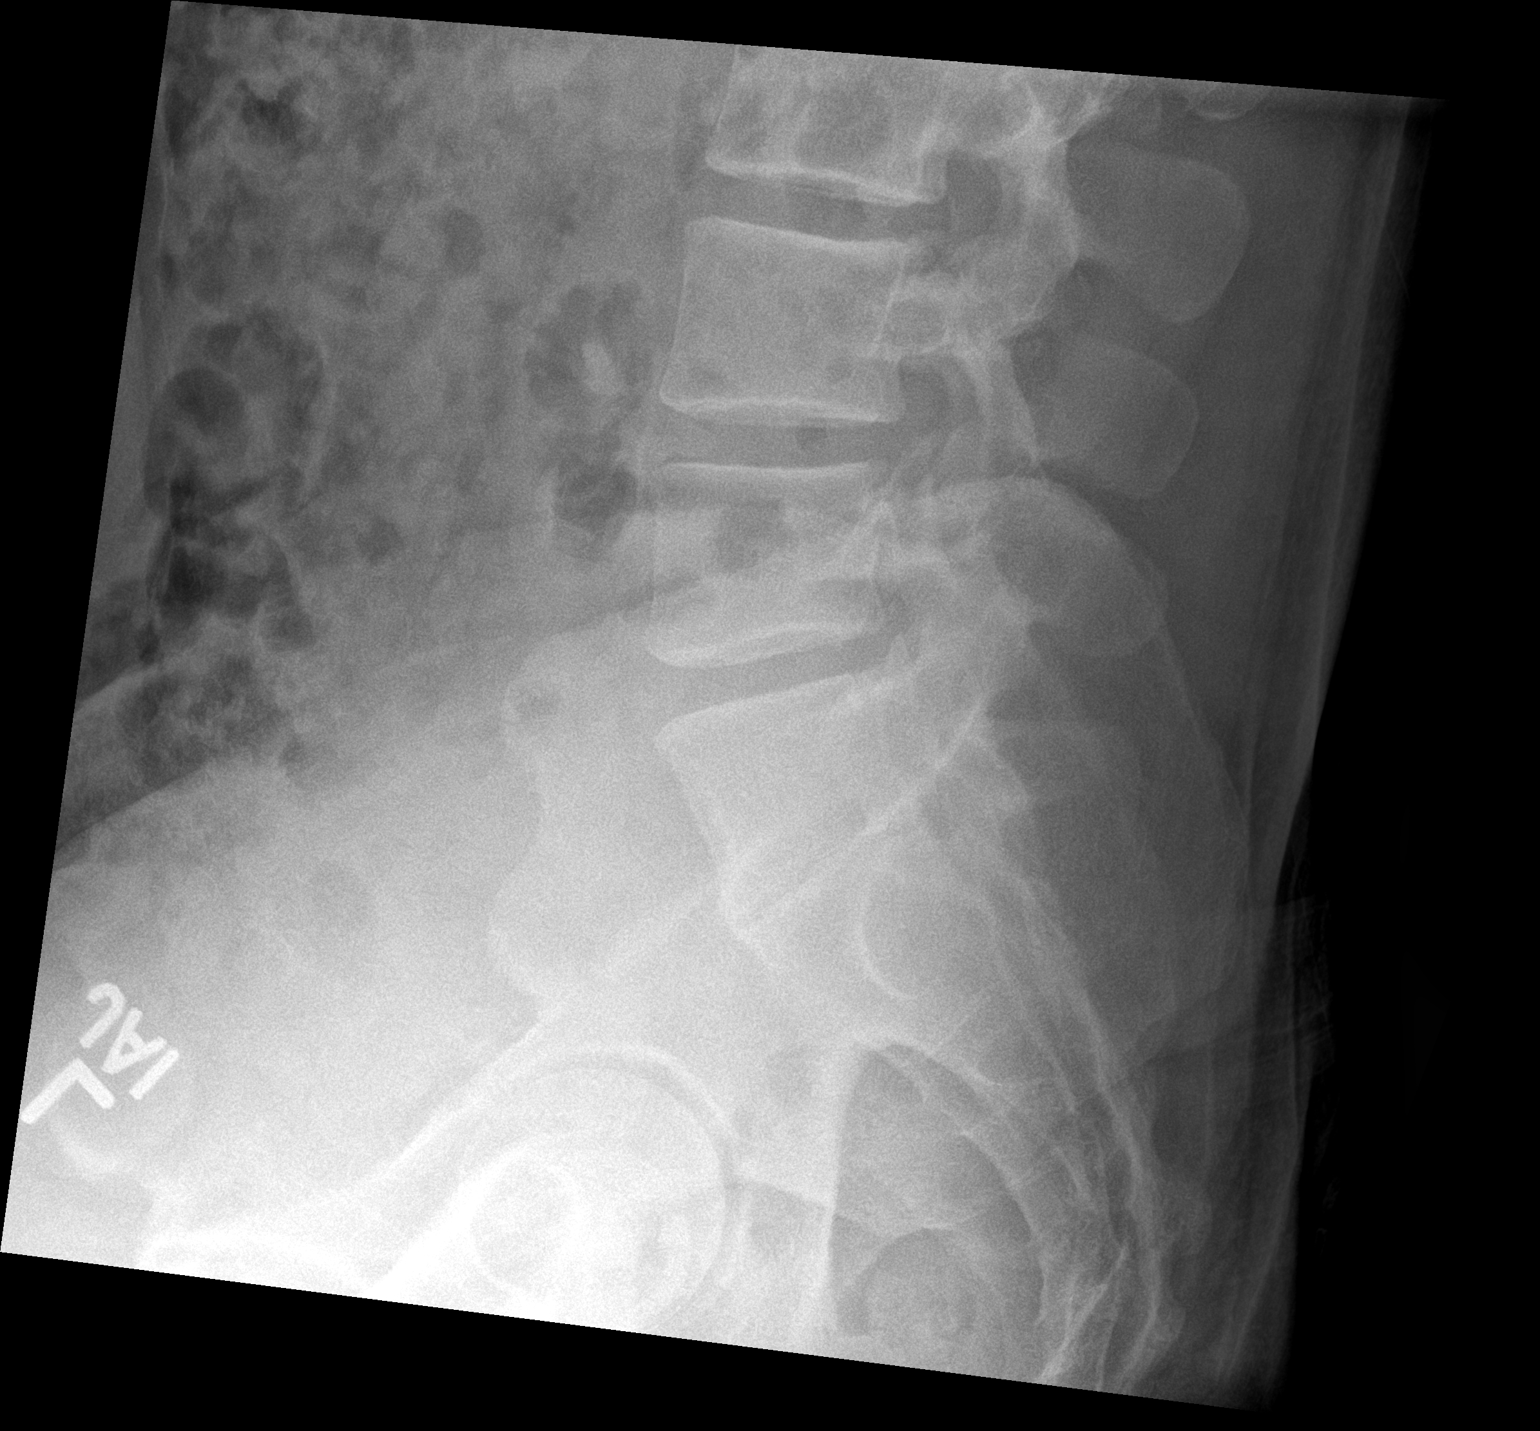

[3 of 3 positions shown; findings below may reference images not displayed]

FINDINGS: There is no evidence of lumbar spine fracture. Alignment is normal.
Intervertebral disc spaces are maintained.
IMPRESSION: Negative.

## 2022-11-24 ENCOUNTER — Other Ambulatory Visit: Payer: Self-pay | Admitting: Internal Medicine

## 2022-12-15 NOTE — Progress Notes (Unsigned)
    Aleen Sells D.[facility] [address] [place] [address] Phone: [phone]   Assessment and Plan:     There are no diagnoses linked to this encounter.  ***   Pertinent previous records reviewed include ***   Follow Up: ***     Subjective:   I, Garrett Miller, am serving as a Neurosurgeon for Doctor Richardean Sale   Chief Complaint: low back pain    HPI:    07/30/2021 Patient is a 37 year old male complaining of low back pain. Patient states chronic lumbar pain, lifted heavy object last Sunday  and started hurting right away in the lumbar area.  Sharp pain worse with movement , pain does radiate down his legs, does get some numbness and tingling going down his legs , used flexeril for 2 days but took it at work so he didn't take it again , he isnt able to get up early for work when he takes it at night   12/16/2022 Patient states    Relevant Historical Information: Hypertension, chronic gastritis    Additional pertinent review of systems negative.   Current Outpatient Medications:    acetic acid-hydrocortisone (VOSOL-HC) OTIC solution, Place 3 drops into the right ear 3 (three) times daily., Disp: 10 mL, Rfl: 1   cyclobenzaprine (FLEXERIL) 5 MG tablet, Take 1 tablet (5 mg total) by mouth at bedtime as needed for muscle spasms., Disp: 30 tablet, Rfl: 1   dicyclomine (BENTYL) 20 MG tablet, Take 1 tablet (20 mg total) by mouth 3 (three) times daily before meals for 2 days., Disp: 6 tablet, Rfl: 1   lisinopril (ZESTRIL) 10 MG tablet, TAKE 1 TABLET(10 MG) BY MOUTH DAILY, Disp: 90 tablet, Rfl: 1   meloxicam (MOBIC) 15 MG tablet, TAKE 1 TABLET(15 MG) BY MOUTH DAILY WITH FOOD AS NEEDED FOR PAIN, Disp: 30 tablet, Rfl: 0   Objective:     There were no vitals filed for this visit.    There is no height or weight on file to calculate BMI.    Physical Exam:    ***   Electronically signed by:  Aleen Sells D.[facility] 2:13 PM 12/15/22

## 2022-12-16 ENCOUNTER — Ambulatory Visit: Payer: Managed Care, Other (non HMO) | Admitting: Sports Medicine

## 2022-12-16 VITALS — BP 132/84 | HR 82 | Ht 70.0 in | Wt 178.0 lb

## 2022-12-16 DIAGNOSIS — G8929 Other chronic pain: Secondary | ICD-10-CM | POA: Diagnosis not present

## 2022-12-16 DIAGNOSIS — M5442 Lumbago with sciatica, left side: Secondary | ICD-10-CM | POA: Diagnosis not present

## 2022-12-16 DIAGNOSIS — M5441 Lumbago with sciatica, right side: Secondary | ICD-10-CM

## 2022-12-16 MED ORDER — CYCLOBENZAPRINE HCL 5 MG PO TABS: 5.0000 mg | ORAL_TABLET | Freq: Every day | ORAL | 0 refills | Status: DC

## 2022-12-16 MED ORDER — METHYLPREDNISOLONE 4 MG PO TBPK: ORAL_TABLET | ORAL | 0 refills | Status: DC

## 2022-12-16 NOTE — Patient Instructions (Signed)
Prednisone dos pak  Flexeril 5 mg nightly  Low back HEP  4 week follow up

## 2023-01-19 ENCOUNTER — Other Ambulatory Visit: Payer: Self-pay | Admitting: Emergency Medicine

## 2023-01-19 DIAGNOSIS — I10 Essential (primary) hypertension: Secondary | ICD-10-CM

## 2023-03-14 ENCOUNTER — Ambulatory Visit
Admission: EM | Admit: 2023-03-14 | Discharge: 2023-03-14 | Disposition: A | Discharge status: Home / Self Care | Payer: Managed Care, Other (non HMO) | Attending: Family Medicine | Admitting: Family Medicine

## 2023-03-14 DIAGNOSIS — L03213 Periorbital cellulitis: Secondary | ICD-10-CM | POA: Diagnosis not present

## 2023-03-14 MED ORDER — AMOXICILLIN-POT CLAVULANATE 875-125 MG PO TABS: 1.0000 | ORAL_TABLET | Freq: Two times a day (BID) | ORAL | 0 refills | Status: DC

## 2023-03-14 MED ORDER — IBUPROFEN 600 MG PO TABS: 600.0000 mg | ORAL_TABLET | Freq: Four times a day (QID) | ORAL | 0 refills | Status: DC | PRN

## 2023-03-14 NOTE — ED Triage Notes (Signed)
Pt c/o redness, swelling to left upper eyelid x 5 days-NAD-steady gait

## 2023-03-14 NOTE — ED Provider Notes (Signed)
[facility]  Note:  This document was prepared using Conservation officer, historic buildings and may include unintentional dictation errors.  MRN: [id number] DOB: Dec 30, 1985  Subjective:   Garrett Miller is a 38 y.o. male presenting for 5-day history of acute onset persistent and worsening swelling of the left upper eyelid with pain and redness.  No recent illness.  No eye trauma.  No vision changes.  No current facility-administered medications for this encounter.  Current Outpatient Medications:    acetic acid-hydrocortisone (VOSOL-HC) OTIC solution, Place 3 drops into the right ear 3 (three) times daily., Disp: 10 mL, Rfl: 1   cyclobenzaprine (FLEXERIL) 5 MG tablet, Take 1 tablet (5 mg total) by mouth at bedtime as needed for muscle spasms., Disp: 30 tablet, Rfl: 1   cyclobenzaprine (FLEXERIL) 5 MG tablet, Take 1 tablet (5 mg total) by mouth at bedtime., Disp: 30 tablet, Rfl: 0   dicyclomine (BENTYL) 20 MG tablet, Take 1 tablet (20 mg total) by mouth 3 (three) times daily before meals for 2 days., Disp: 6 tablet, Rfl: 1   lisinopril (ZESTRIL) 10 MG tablet, TAKE 1 TABLET(10 MG) BY MOUTH DAILY, Disp: 90 tablet, Rfl: 0   meloxicam (MOBIC) 15 MG tablet, TAKE 1 TABLET(15 MG) BY MOUTH DAILY WITH FOOD AS NEEDED FOR PAIN, Disp: 30 tablet, Rfl: 0   methylPREDNISolone (MEDROL DOSEPAK) 4 MG TBPK tablet, Take 6 tablets on day 1.  Take 5 tablets on day 2.  Take 4 tablets on day 3.  Take 3 tablets on day 4.  Take 2 tablets on day 5.  Take 1 tablet on day 6., Disp: 21 tablet, Rfl: 0   No Known Allergies  Past Medical History:  Diagnosis Date   Hypertension      Past Surgical History:  Procedure Laterality Date   CHOLECYSTECTOMY  2013   LAPAROSCOPIC APPENDECTOMY N/A 06/24/2015   Procedure: APPENDECTOMY LAPAROSCOPIC;  Surgeon: De Blanch Kinsinger, MD;  Location: [facility] OR;  Service: General;  Laterality: N/A;    Family History  Problem Relation Age of Onset   Hypertension Mother     Colon cancer Neg Hx    Esophageal cancer Neg Hx     Social History   Tobacco Use   Smoking status: Never   Smokeless tobacco: Never  Vaping Use   Vaping status: Never Used  Substance Use Topics   Alcohol use: Yes    Alcohol/week: 0.0 standard drinks of alcohol    Comment: Ociassionally/ A beer once in a while    Drug use: No    ROS   Objective:   Vitals: BP 124/74 (BP Location: Right Arm)   Pulse (!) 54   Temp 97.9 F (36.6 C) (Oral)   Resp 16   SpO2 96%   Physical Exam Constitutional:      General: He is not in acute distress.    Appearance: Normal appearance. He is well-developed and normal weight. He is not ill-appearing, toxic-appearing or diaphoretic.  HENT:     Head: Normocephalic and atraumatic.     Right Ear: External ear normal.     Left Ear: External ear normal.     Nose: Nose normal.     Mouth/Throat:     Pharynx: Oropharynx is clear.  Eyes:     General: No scleral icterus.       Right eye: No foreign body, discharge or hordeolum.        Left eye: No foreign body, discharge or hordeolum.  Extraocular Movements: Extraocular movements intact.     Conjunctiva/sclera:     Right eye: Right conjunctiva is not injected. No chemosis, exudate or hemorrhage.    Left eye: Left conjunctiva is not injected. No chemosis, exudate or hemorrhage.  Cardiovascular:     Rate and Rhythm: Normal rate.  Pulmonary:     Effort: Pulmonary effort is normal.  Musculoskeletal:     Cervical back: Normal range of motion.  Neurological:     Mental Status: He is alert and oriented to person, place, and time.  Psychiatric:        Mood and Affect: Mood normal.        Behavior: Behavior normal.        Thought Content: Thought content normal.        Judgment: Judgment normal.     Assessment and Plan :   PDMP not reviewed this encounter.  1. Preseptal cellulitis of left upper eyelid    Will manage for preseptal cellulitis of the left upper eyelid with Augmentin.   Recommend supportive care otherwise.  Counseled patient on potential for adverse effects with medications prescribed/recommended today, ER and return-to-clinic precautions discussed, patient verbalized understanding.    Wallis Bamberg, [place] 03/14/23 1610

## 2023-04-26 ENCOUNTER — Other Ambulatory Visit: Payer: Self-pay | Admitting: Emergency Medicine

## 2023-04-26 DIAGNOSIS — I10 Essential (primary) hypertension: Secondary | ICD-10-CM

## 2023-04-26 NOTE — Telephone Encounter (Signed)
 Marland Kitchen

## 2023-07-25 ENCOUNTER — Encounter: Payer: Self-pay | Admitting: Emergency Medicine

## 2023-07-25 ENCOUNTER — Ambulatory Visit (INDEPENDENT_AMBULATORY_CARE_PROVIDER_SITE_OTHER): Admitting: Emergency Medicine

## 2023-07-25 VITALS — BP 120/82 | HR 57 | Temp 98.5°F | Ht 70.0 in | Wt 180.0 lb

## 2023-07-25 DIAGNOSIS — I10 Essential (primary) hypertension: Secondary | ICD-10-CM | POA: Diagnosis not present

## 2023-07-25 DIAGNOSIS — K2900 Acute gastritis without bleeding: Secondary | ICD-10-CM | POA: Insufficient documentation

## 2023-07-25 MED ORDER — PANTOPRAZOLE SODIUM 40 MG PO TBEC
40.0000 mg | DELAYED_RELEASE_TABLET | Freq: Every day | ORAL | 1 refills | Status: DC
Start: 2023-07-25 — End: 2023-11-08

## 2023-07-25 NOTE — Assessment & Plan Note (Signed)
 Acute exacerbation of chronic problem Diet and nutrition discussed Clinically stable.  No red flag signs or symptoms. Recommend to start pantoprazole  40 mg daily for 8 weeks. If symptoms recurred after that or no better, recommend upper endoscopy. Advised to stay well-hydrated and avoid NSAIDs May continue liquid antacids during the day as needed.

## 2023-07-25 NOTE — Assessment & Plan Note (Signed)
 BP Readings from Last 3 Encounters:  07/25/23 120/82  03/14/23 124/74  12/16/22 132/84  Well-controlled hypertension. Continue lisinopril  10 mg daily.

## 2023-07-25 NOTE — Progress Notes (Signed)
 Garrett Miller 38 y.o.   Chief Complaint  Patient presents with   Gastroesophageal Reflux    Patient states every time he eats he's been having stomach pain/ acid reflux states its been going on for about 2-3 weeks, has gotten better with taking tums/ Pepcid      HISTORY OF PRESENT ILLNESS: This is a 38 y.o. male complaining of burning epigastric pain on and off for the past couple weeks.  Worse with eating. Has history of gastritis.  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 Has been taking Riopan liquid antacid with good results Denies nausea or vomiting.  Denies melena or hematemesis No other associated symptoms No other complaints or medical concerns today Non-smoker.  No EtOH user.  Not taking NSAIDs at present time Increased stress for different reasons.  Known triggers.  Gastroesophageal Reflux He complains of abdominal pain. He reports no chest pain, no coughing, no nausea or no sore throat. Pertinent negatives include no melena.     Prior to Admission medications   Medication Sig Start Date End Date Taking? Authorizing Provider  lisinopril  (ZESTRIL ) 10 MG tablet TAKE 1 TABLET(10 MG) BY MOUTH DAILY 04/26/23  Yes Cantrell Larouche, Isidro Margo, MD  dicyclomine  (BENTYL ) 20 MG tablet Take 1 tablet (20 mg total) by mouth 3 (three) times daily before meals for 2 days. 10/08/20 10/10/20  Elvira Hammersmith, MD    No Known Allergies  Patient Active Problem List   Diagnosis Date Noted   Chronic superficial gastritis without bleeding 12/06/2018   Essential hypertension 02/10/2015   HTN (hypertension) 08/22/2013    Past Medical History:  Diagnosis Date   Hypertension     Past Surgical History:  Procedure Laterality Date   CHOLECYSTECTOMY  2013   LAPAROSCOPIC APPENDECTOMY N/A 06/24/2015   Procedure: APPENDECTOMY LAPAROSCOPIC;  Surgeon: Alphonso Aschoff Kinsinger, MD;  Location: [facility] OR;  Service: General;  Laterality: N/A;    Social History   Socioeconomic History   Marital status:  Single    Spouse name: Not on file   Number of children: 2   Years of education: Not on file   Highest education level: Not on file  Occupational History   Occupation: Warehouse  Tobacco Use   Smoking status: Never   Smokeless tobacco: Never  Vaping Use   Vaping status: Never Used  Substance and Sexual Activity   Alcohol use: Not Currently    Comment: occ   Drug use: Yes    Types: Marijuana   Sexual activity: Yes  Other Topics Concern   Not on file  Social History Narrative   Marital status: single; dating seriously x 10 years.  From [place]; [place]  [place] 10 years.      Children: one child.      Employment: Naval architect.   Social Drivers of Corporate investment banker Strain: Not on file  Food Insecurity: Not on file  Transportation Needs: Not on file  Physical Activity: Not on file  Stress: Not on file  Social Connections: Not on file  Intimate Partner Violence: Not on file    Family History  Problem Relation Age of Onset   Hypertension Mother    Colon cancer Neg Hx    Esophageal cancer Neg Hx      Review of Systems  Constitutional: Negative.  Negative for chills and fever.  HENT: Negative.  Negative for congestion and sore throat.   Respiratory: Negative.  Negative for cough and shortness of breath.   Cardiovascular: Negative.  Negative for chest pain  and palpitations.  Gastrointestinal:  Positive for abdominal pain. Negative for blood in stool, diarrhea, melena, nausea and vomiting.  Genitourinary: Negative.  Negative for dysuria and hematuria.  Skin: Negative.  Negative for rash.  Neurological: Negative.  Negative for dizziness and headaches.  All other systems reviewed and are negative.   Vitals:   07/25/23 0808  BP: 120/82  Pulse: (!) 57  Temp: 98.5 F (36.9 C)  SpO2: 97%    Physical Exam Vitals reviewed.  Constitutional:      Appearance: Normal appearance.  HENT:     Head: Normocephalic.     Mouth/Throat:     Mouth: Mucous membranes are moist.      Pharynx: Oropharynx is clear.  Eyes:     Extraocular Movements: Extraocular movements intact.     Pupils: Pupils are equal, round, and reactive to light.  Cardiovascular:     Rate and Rhythm: Normal rate and regular rhythm.     Pulses: Normal pulses.     Heart sounds: Normal heart sounds.  Pulmonary:     Effort: Pulmonary effort is normal.     Breath sounds: Normal breath sounds.  Abdominal:     Palpations: Abdomen is soft.     Tenderness: There is no abdominal tenderness.  Musculoskeletal:     Cervical back: No tenderness.  Lymphadenopathy:     Cervical: No cervical adenopathy.  Skin:    General: Skin is warm and dry.     Capillary Refill: Capillary refill takes less than 2 seconds.  Neurological:     General: No focal deficit present.     Mental Status: He is alert and oriented to person, place, and time.  Psychiatric:        Mood and Affect: Mood normal.        Behavior: Behavior normal.      ASSESSMENT & PLAN: A total of 42 minutes was spent with the patient and counseling/coordination of care regarding preparing for this visit, review of most recent office visit notes, review of multiple chronic medical conditions and their management, diagnosis of gastritis and management, review of all medications and changes made, review of most recent bloodwork results, review of health maintenance items, education on nutrition, prognosis, documentation, and need for follow up.   Problem List Items Addressed This Visit       Cardiovascular and Mediastinum   Essential hypertension   BP Readings from Last 3 Encounters:  07/25/23 120/82  03/14/23 124/74  12/16/22 132/84  Well-controlled hypertension. Continue lisinopril  10 mg daily.          Digestive   Acute superficial gastritis without hemorrhage - Primary   Acute exacerbation of chronic problem Diet and nutrition discussed Clinically stable.  No red flag signs or symptoms. Recommend to start pantoprazole  40 mg  daily for 8 weeks. If symptoms recurred after that or no better, recommend upper endoscopy. Advised to stay well-hydrated and avoid NSAIDs May continue liquid antacids during the day as needed.      Relevant Medications   pantoprazole  (PROTONIX ) 40 MG tablet   Patient Instructions  Gastritis en adultos Gastritis, Adult La gastritis es irritacin e hinchazn (inflamacin) del estmago. Hay dos tipos de gastritis: Gastritis aguda. Este tipo se desarrolla rpidamente. Gastritis crnica. Este tipo es mucho ms frecuente. Se desarrolla lentamente y dura un largo tiempo. Es importante recibir ayuda para Copy. Si no obtiene [place], Radiographer, therapeutic y Water engineer (lceras) en el [employer]. Cules son las causas? Esta  afeccin puede ser causada por lo siguiente: Grmenes que llegan al estmago y provocan una infeccin. Beber alcohol en exceso. Los medicamentos que toma. Tener demasiado cido en el estmago. Tener una enfermedad del estmago. Algunas otras causas son las siguientes: Runner, broadcasting/film/video. Algunos tratamientos para el cncer (radiacin). Fumar cigarrillos o usar productos que contienen nicotina o tabaco. En algunos casos, se desconoce la causa de esta afeccin. Qu incrementa el riesgo? Tener una enfermedad de los intestinos. Tener enfermedad de Crohn. Usar aspirina o ibuprofeno y otros antiinflamatorios no esteroideos (AINE) para tratar otras afecciones. Estrs. Cules son los signos o sntomas? Dolor en Investment banker, corporate. Una sensacin de Surveyor, minerals. Sensacin de que va a vomitar (nuseas). Vmitos o vmitos con sangre. Sensacin de estar demasiado lleno luego de comer. Prdida de peso. Mal aliento. Sangre en las heces (deposiciones). En algunos casos, no hay sntomas. Cmo se trata? Esta afeccin se trata con medicamentos. Los medicamentos que se usan dependen de lo que provoc la afeccin. Es posible que le administren lo  siguiente: Antibiticos, si la causa de la afeccin fue una infeccin provocada por grmenes. Bloqueadores H2 y medicamentos similares, si la afeccin fue causada por una gran cantidad de cido en el estmago. El tratamiento tambin puede incluir interrumpir el uso de ciertos medicamentos, como aspirina o ibuprofeno. Siga estas instrucciones en su casa: Medicamentos Use los medicamentos de venta libre y los recetados solamente como se lo haya indicado el mdico. Si le recetaron un antibitico, tmelo como se lo haya indicado el mdico. No deje de tomarlo aunque comience a sentirse mejor. Consumo de alcohol No beba alcohol si: El mdico le indica que no lo haga. Est embarazada, puede estar embarazada o est tratando de quedar embarazada. Si bebe alcohol: Limite su uso a las siguientes medidas: De 0 a 1 medida por da para las mujeres. De 0 a 2 medidas por da para los hombres. Sepa cunta cantidad de alcohol hay en las bebidas que toma. En los [address], una medida equivale a una botella de cerveza de 12 oz (355 ml), un vaso de vino de 5 oz (148 ml) o un vaso de una bebida alcohlica de alta graduacin de 1 oz (44 ml). Instrucciones generales  Haga comidas pequeas y frecuentes en lugar de comidas abundantes. Evite los alimentos y las bebidas que lo Dietitian. Beba suficiente lquido para Radio producer pis (la orina) de color amarillo plido. Converse con el mdico sobre maneras de Charity fundraiser. Puede realizar ejercicios de respiracin profunda, meditacin o yoga. No fume ni consuma ningn producto que contenga nicotina o tabaco. Si necesita ayuda para dejar de fumar, consulte al mdico. Concurra a todas las visitas de seguimiento. Comunquese con un mdico si: Sus sntomas empeoran. El dolor de [place]. Los sntomas desaparecen y luego reaparecen. Tiene fiebre. Solicite ayuda de inmediato si: Vomita sangre o una sustancia parecida a los granos de [place]. La  materia fecal es negra o de color rojo oscuro. Vomita cada vez que intenta tomar lquidos. Estos sntomas pueden Customer service manager. Solicite ayuda de inmediato. Comunquese con el servicio de emergencias de su localidad (911 en los [place]). No espere a ver si los sntomas desaparecen. No conduzca por sus propios medios [facility]. Resumen La gastritis es irritacin e hinchazn (inflamacin) del estmago. Debe obtener ayuda para tratar esta afeccin. Si no obtiene [place], Radiographer, therapeutic y Water engineer (lceras) en el [employer]. Puede recibir tratamiento con [employer]  para los grmenes o medicamentos para bloquear la presencia de demasiada cantidad de cido en el estmago. Esta informacin no tiene Theme park manager el consejo del mdico. Asegrese de hacerle al mdico cualquier pregunta que tenga. Document Revised: 07/03/2020 Document Reviewed: 07/03/2020 Elsevier Patient Education  2024 Elsevier Inc.   Maryagnes Small, MD [facility]

## 2023-07-25 NOTE — Patient Instructions (Signed)
Gastritis en adultos Gastritis, Adult La gastritis es irritacin e hinchazn (inflamacin) del estmago. Hay dos tipos de gastritis: Gastritis aguda. Este tipo se desarrolla rpidamente. Gastritis crnica. Este tipo es mucho ms frecuente. Se desarrolla lentamente y dura un largo tiempo. Es importante recibir ayuda para Copy. Si no obtiene [place], Radiographer, therapeutic y Water engineer (lceras) en el [employer]. Cules son las causas? Esta afeccin puede ser causada por lo siguiente: Grmenes que llegan al [employer] y provocan una infeccin. Beber alcohol en exceso. Los medicamentos que toma. Tener demasiado cido [employer]. Tener una enfermedad del estmago. Algunas otras causas son las siguientes: Runner, broadcasting/film/video. Algunos tratamientos para el cncer (radiacin). Fumar cigarrillos o usar productos que contienen nicotina o tabaco. En algunos casos, se desconoce la causa de esta afeccin. Qu incrementa el riesgo? Tener una enfermedad de los intestinos. Tener enfermedad de Crohn. Usar aspirina o ibuprofeno y otros antiinflamatorios no esteroideos (AINE) para tratar otras afecciones. Estrs. Cules son los signos o sntomas? Dolor en Investment banker, corporate. Una sensacin de Surveyor, minerals. Sensacin de que va a vomitar (nuseas). Vmitos o vmitos con sangre. Sensacin de estar demasiado lleno luego de comer. Prdida de peso. Mal aliento. Sangre en las heces (deposiciones). En algunos casos, no hay sntomas. Cmo se trata? Esta afeccin se trata con medicamentos. Los medicamentos que se usan dependen de lo que provoc la afeccin. Es posible que le administren lo siguiente: Antibiticos, si la causa de la afeccin fue una infeccin provocada por grmenes. Bloqueadores H2 y medicamentos similares, si la afeccin fue causada por una gran cantidad de cido en el estmago. El tratamiento tambin puede incluir interrumpir el uso de ciertos medicamentos,  como aspirina o ibuprofeno. Siga estas instrucciones en su casa: Medicamentos Use los medicamentos de venta libre y los recetados solamente como se lo haya indicado el mdico. Si le recetaron un antibitico, tmelo como se lo haya indicado el mdico. No deje de tomarlo aunque comience a sentirse mejor. Consumo de alcohol No beba alcohol si: El mdico le indica que no lo haga. Est embarazada, puede estar embarazada o est tratando de [place]. Si bebe alcohol: Limite su uso a las siguientes medidas: De 0 a 1 medida por da para las mujeres. De 0 a 2 medidas por da para los hombres. Sepa cunta cantidad de alcohol hay en las bebidas que toma. En los [address], una medida equivale a una botella de cerveza de 12 oz (355 ml), un vaso de vino de 5 oz (148 ml) o un vaso de una bebida alcohlica de alta graduacin de 1 oz (44 ml). Instrucciones generales  Haga comidas pequeas y frecuentes en lugar de comidas abundantes. Evite los alimentos y las bebidas que lo Dietitian. Beba suficiente lquido para Radio producer pis (la orina) de color amarillo plido. Converse con el mdico sobre maneras de Charity fundraiser. Puede realizar ejercicios de respiracin profunda, meditacin o yoga. No fume ni consuma ningn producto que contenga nicotina o tabaco. Si necesita ayuda para dejar de fumar, consulte al mdico. Concurra a todas las visitas de seguimiento. Comunquese con un mdico si: Sus sntomas empeoran. El dolor de [place]. Los sntomas desaparecen y Stage manager. Tiene fiebre. Solicite ayuda de inmediato si: Vomita sangre o una sustancia parecida a los granos de [place]. La materia fecal es negra o de color rojo oscuro. Vomita cada vez que intenta tomar lquidos. Estos sntomas pueden Customer service manager. Solicite ayuda de inmediato.  Comunquese con el servicio de emergencias de su localidad (911 en los [place]). No espere a ver si los sntomas  desaparecen. No conduzca por sus propios medios [facility]. Resumen La gastritis es irritacin e hinchazn (inflamacin) del estmago. Debe obtener ayuda para tratar esta afeccin. Si no obtiene [place], Radiographer, therapeutic y Water engineer (lceras) en el [employer]. Puede recibir tratamiento con medicamentos para los grmenes o medicamentos para bloquear la presencia de demasiada cantidad de cido Higher education careers adviser. Esta informacin no tiene Theme park manager el consejo del mdico. Asegrese de hacerle al mdico cualquier pregunta que tenga. Document Revised: 07/03/2020 Document Reviewed: 07/03/2020 Elsevier Patient Education  2024 [employer].

## 2023-07-29 ENCOUNTER — Other Ambulatory Visit: Payer: Self-pay | Admitting: Emergency Medicine

## 2023-07-29 DIAGNOSIS — I10 Essential (primary) hypertension: Secondary | ICD-10-CM

## 2023-08-04 ENCOUNTER — Encounter: Payer: Self-pay | Admitting: Emergency Medicine

## 2023-08-04 ENCOUNTER — Ambulatory Visit: Payer: Self-pay | Admitting: Emergency Medicine

## 2023-08-04 ENCOUNTER — Ambulatory Visit: Admitting: Emergency Medicine

## 2023-08-04 VITALS — BP 120/80 | HR 54 | Temp 98.0°F | Ht 70.0 in | Wt 176.0 lb

## 2023-08-04 DIAGNOSIS — Z0000 Encounter for general adult medical examination without abnormal findings: Secondary | ICD-10-CM

## 2023-08-04 DIAGNOSIS — Z13228 Encounter for screening for other metabolic disorders: Secondary | ICD-10-CM | POA: Diagnosis not present

## 2023-08-04 DIAGNOSIS — Z13220 Encounter for screening for lipoid disorders: Secondary | ICD-10-CM | POA: Diagnosis not present

## 2023-08-04 DIAGNOSIS — Z1329 Encounter for screening for other suspected endocrine disorder: Secondary | ICD-10-CM

## 2023-08-04 DIAGNOSIS — I10 Essential (primary) hypertension: Secondary | ICD-10-CM

## 2023-08-04 DIAGNOSIS — Z130 Encounter for screening for diseases of the blood and blood-forming organs and certain disorders involving the immune mechanism: Secondary | ICD-10-CM

## 2023-08-04 DIAGNOSIS — K2930 Chronic superficial gastritis without bleeding: Secondary | ICD-10-CM

## 2023-08-04 LAB — CBC WITH DIFFERENTIAL/PLATELET
Basophils Absolute: 0 10*3/uL (ref 0.0–0.1)
Basophils Relative: 0.3 % (ref 0.0–3.0)
Eosinophils Absolute: 0.1 10*3/uL (ref 0.0–0.7)
Eosinophils Relative: 2.2 % (ref 0.0–5.0)
HCT: 45.4 % (ref 39.0–52.0)
Hemoglobin: 15.7 g/dL (ref 13.0–17.0)
Lymphocytes Relative: 29.4 % (ref 12.0–46.0)
Lymphs Abs: 1.9 10*3/uL (ref 0.7–4.0)
MCHC: 34.5 g/dL (ref 30.0–36.0)
MCV: 90.1 fl (ref 78.0–100.0)
Monocytes Absolute: 0.4 10*3/uL (ref 0.1–1.0)
Monocytes Relative: 6.7 % (ref 3.0–12.0)
Neutro Abs: 3.9 10*3/uL (ref 1.4–7.7)
Neutrophils Relative %: 61.4 % (ref 43.0–77.0)
Platelets: 200 10*3/uL (ref 150.0–400.0)
RBC: 5.04 Mil/uL (ref 4.22–5.81)
RDW: 12.8 % (ref 11.5–15.5)
WBC: 6.3 10*3/uL (ref 4.0–10.5)

## 2023-08-04 LAB — COMPREHENSIVE METABOLIC PANEL WITH GFR
ALT: 31 U/L (ref 0–53)
AST: 24 U/L (ref 0–37)
Albumin: 4.7 g/dL (ref 3.5–5.2)
Alkaline Phosphatase: 66 U/L (ref 39–117)
BUN: 18 mg/dL (ref 6–23)
CO2: 30 meq/L (ref 19–32)
Calcium: 9.5 mg/dL (ref 8.4–10.5)
Chloride: 101 meq/L (ref 96–112)
Creatinine, Ser: 0.95 mg/dL (ref 0.40–1.50)
GFR: 100.59 mL/min (ref >60.00)
Glucose, Bld: 95 mg/dL (ref 70–99)
Potassium: 4.3 meq/L (ref 3.5–5.1)
Sodium: 137 meq/L (ref 135–145)
Total Bilirubin: 0.5 mg/dL (ref 0.2–1.2)
Total Protein: 7.7 g/dL (ref 6.0–8.3)

## 2023-08-04 LAB — LIPID PANEL
Cholesterol: 171 mg/dL (ref 0–200)
HDL: 36.1 mg/dL — ABNORMAL LOW (ref >39.00)
LDL Cholesterol: 112 mg/dL — ABNORMAL HIGH (ref 0–99)
NonHDL: 135.29
Total CHOL/HDL Ratio: 5
Triglycerides: 117 mg/dL (ref 0.0–149.0)
VLDL: 23.4 mg/dL (ref 0.0–40.0)

## 2023-08-04 LAB — HEMOGLOBIN A1C: Hgb A1c MFr Bld: 5.7 % (ref 4.6–6.5)

## 2023-08-04 NOTE — Patient Instructions (Signed)
 Mantenimiento de Research officer, political party, Male Adoptar un estilo de vida saludable y recibir atencin preventiva son importantes para promover la salud y Counsellor. Consulte al mdico sobre: El esquema adecuado para hacerse pruebas y exmenes peridicos. Cosas que puede hacer por su cuenta para prevenir enfermedades y [place] sano. Qu debo saber sobre la dieta, el peso y el ejercicio? Consuma una dieta saludable  Consuma una dieta que incluya muchas verduras, frutas, productos lcteos con bajo contenido de [place] y Associate Professor. No consuma muchos alimentos ricos en grasas slidas, azcares agregados o sodio. Mantenga un peso saludable El ndice de masa muscular [facility]) es una medida que puede utilizarse para identificar posibles problemas de [place]. Proporciona una estimacin de la grasa corporal basndose en el peso y la altura. Su mdico puede ayudarle a Engineer, site IMC y a Personnel officer o Pharmacologist un peso saludable. Haga ejercicio con regularidad Haga ejercicio con regularidad. Esta es una de las prcticas ms importantes que puede hacer por su salud. La [employer] de los adultos deben seguir estas pautas: Education officer, environmental, al menos, 150 minutos de actividad fsica por semana. El ejercicio debe aumentar la frecuencia cardaca y Media planner transpirar (ejercicio de intensidad moderada). Hacer ejercicios de fortalecimiento por lo [employer] por semana. Agregue esto a su plan de ejercicio de intensidad moderada. Pase menos tiempo sentado. Incluso la actividad fsica ligera puede ser beneficiosa. Controle sus niveles de colesterol y lpidos en la sangre Comience a realizarse anlisis de lpidos y Oncologist en la sangre a los 20 aos y luego reptalos cada 5 aos. Es posible que Insurance underwriter los niveles de colesterol con mayor frecuencia si: Sus niveles de lpidos y colesterol son altos. Es mayor de 40 aos. Presenta un alto riesgo de padecer enfermedades cardacas. Qu debo  saber sobre las pruebas de deteccin del cncer? Muchos tipos de cncer pueden detectarse de manera temprana y, a menudo, pueden prevenirse. Segn su historia clnica y sus antecedentes familiares, es posible que deba realizarse pruebas de deteccin del cncer en diferentes edades. Esto puede incluir pruebas de deteccin de lo siguiente: Building services engineer. Cncer de prstata. Cncer de piel. Cncer de pulmn. Qu debo saber sobre la enfermedad cardaca, la diabetes y la hipertensin arterial? Presin arterial y enfermedad cardaca La hipertensin arterial causa enfermedades cardacas y [place] el riesgo de accidente cerebrovascular. Es ms probable que esto se manifieste en las personas que tienen lecturas de presin arterial alta o tienen sobrepeso. Hable con el mdico sobre sus valores de presin arterial deseados. Hgase controlar la presin arterial: Cada 3 a 5 aos si tiene entre 18 y 71 aos. Todos los aos si es mayor de 40 aos. Si tiene entre 65 y 26 aos y es fumador o Insurance underwriter, pregntele al mdico si debe realizarse una prueba de deteccin de aneurisma artico abdominal (AAA) por nica vez. Diabetes Realcese exmenes de deteccin de la diabetes con regularidad. Este anlisis revisa el nivel de azcar en la sangre en [place]. Hgase las pruebas de deteccin: Cada tres aos despus de los 45 aos de edad si tiene un peso normal y un bajo riesgo de padecer diabetes. Con ms frecuencia y a partir de [place] edad inferior si tiene sobrepeso o un alto riesgo de padecer diabetes. Qu debo saber sobre la prevencin de infecciones? Hepatitis B Si tiene un riesgo ms alto de contraer hepatitis B, debe someterse a un examen de deteccin de este virus. Hable con el mdico para averiguar si tiene riesgo de  contraer la infeccin por hepatitis B. Hepatitis C Se recomienda un anlisis de [place] para: Todos los que nacieron entre 1945 y [phone]. Todas las personas que tengan un riesgo de haber  contrado hepatitis C. Enfermedades de transmisin sexual (ETS) Debe realizarse pruebas de deteccin de ITS todos los aos, incluidas la gonorrea y la clamidia, si: Es sexualmente activo y es menor de [address]. Es mayor de [address], y Public affairs consultant informa que corre riesgo de tener este tipo de infecciones. La actividad sexual ha cambiado desde que le hicieron la ltima prueba de deteccin y tiene un riesgo mayor de Warehouse manager clamidia o Copy. Pregntele al mdico si usted tiene riesgo. Pregntele al mdico si usted tiene un alto riesgo de Primary school teacher VIH. El mdico tambin puede recomendarle un medicamento recetado para ayudar a evitar la infeccin por el VIH. Si elige tomar medicamentos para prevenir el VIH, primero debe [employer] de deteccin del VIH. Luego debe hacerse anlisis cada 3 meses mientras est tomando los medicamentos. Siga estas indicaciones en su casa: Consumo de alcohol No beba alcohol si el mdico se lo prohbe. Si bebe alcohol: Limite la cantidad que consume de 0 a 2 bebidas por da. Sepa cunta cantidad de alcohol hay en las bebidas que toma. En los [address], una medida equivale a una botella de cerveza de 12 oz (355 ml), un vaso de vino de 5 oz (148 ml) o un vaso de una bebida alcohlica de alta graduacin de 1 oz (44 ml). Estilo de vida No consuma ningn producto que contenga nicotina o tabaco. Estos productos incluyen cigarrillos, tabaco para Theatre manager y aparatos de vapeo, como los Administrator, Civil Service. Si necesita ayuda para dejar de consumir estos productos, consulte al mdico. No consuma drogas. No comparta agujas. Solicite ayuda a su mdico si necesita apoyo o informacin para abandonar las drogas. Indicaciones generales Realcese los estudios de rutina de [address], dentales y de Wellsite geologist. Mantngase al da con las vacunas. Infrmele a su mdico si: Se siente deprimido con frecuencia. Alguna vez ha sido vctima de [place] o no se siente seguro en su  casa. Resumen Adoptar un estilo de vida saludable y recibir atencin preventiva son importantes para promover la salud y Counsellor. Siga las instrucciones del mdico acerca de una dieta saludable, el ejercicio y la realizacin de pruebas o exmenes para Hotel manager. Siga las instrucciones del mdico con respecto al control del colesterol y la presin arterial. Esta informacin no tiene Theme park manager el consejo del mdico. Asegrese de hacerle al mdico cualquier pregunta que tenga. Document Revised: 07/09/2020 Document Reviewed: 07/09/2020 Elsevier Patient Education  2024 [employer].

## 2023-08-04 NOTE — Assessment & Plan Note (Signed)
 BP Readings from Last 3 Encounters:  08/04/23 120/80  07/25/23 120/82  03/14/23 124/74  Well-controlled hypertension. Continue lisinopril  10 mg daily.

## 2023-08-04 NOTE — Progress Notes (Signed)
 Garrett Miller 38 y.o.   Chief Complaint  Patient presents with   Annual Exam    Patient here for physical. No other concerns     HISTORY OF PRESENT ILLNESS: This is a 38 y.o. male here for annual exam. Has no complaints or medical concerns today.  HPI   Prior to Admission medications   Medication Sig Start Date End Date Taking? Authorizing Provider  lisinopril  (ZESTRIL ) 10 MG tablet TAKE 1 TABLET(10 MG) BY MOUTH DAILY 07/29/23  Yes Bee Marchiano, Isidro Margo, MD  pantoprazole  (PROTONIX ) 40 MG tablet Take 1 tablet (40 mg total) by mouth daily. 07/25/23 09/23/23 Yes Levester Waldridge, Isidro Margo, MD  dicyclomine  (BENTYL ) 20 MG tablet Take 1 tablet (20 mg total) by mouth 3 (three) times daily before meals for 2 days. Patient not taking: Reported on 08/04/2023 10/08/20 10/10/20  Elvira Hammersmith, MD    No Known Allergies  Patient Active Problem List   Diagnosis Date Noted   Acute superficial gastritis without hemorrhage 07/25/2023   Chronic superficial gastritis without bleeding 12/06/2018   Essential hypertension 02/10/2015   HTN (hypertension) 08/22/2013    Past Medical History:  Diagnosis Date   Hypertension     Past Surgical History:  Procedure Laterality Date   CHOLECYSTECTOMY  2013   LAPAROSCOPIC APPENDECTOMY N/A 06/24/2015   Procedure: APPENDECTOMY LAPAROSCOPIC;  Surgeon: Alphonso Aschoff Kinsinger, MD;  Location: [facility] OR;  Service: General;  Laterality: N/A;    Social History   Socioeconomic History   Marital status: Single    Spouse name: Not on file   Number of children: 2   Years of education: Not on file   Highest education level: Not on file  Occupational History   Occupation: Warehouse  Tobacco Use   Smoking status: Never   Smokeless tobacco: Never  Vaping Use   Vaping status: Never Used  Substance and Sexual Activity   Alcohol use: Not Currently    Comment: occ   Drug use: Yes    Types: Marijuana   Sexual activity: Yes  Other Topics Concern   Not on file  Social  History Narrative   Marital status: single; dating seriously x 10 years.  From [place]; [place]  [place] 10 years.      Children: one child.      Employment: Naval architect.   Social Drivers of Corporate investment banker Strain: Not on file  Food Insecurity: Not on file  Transportation Needs: Not on file  Physical Activity: Not on file  Stress: Not on file  Social Connections: Not on file  Intimate Partner Violence: Not on file    Family History  Problem Relation Age of Onset   Hypertension Mother    Colon cancer Neg Hx    Esophageal cancer Neg Hx      Review of Systems  Constitutional: Negative.  Negative for chills and fever.  HENT: Negative.  Negative for congestion and sore throat.   Respiratory: Negative.  Negative for cough and shortness of breath.   Cardiovascular: Negative.  Negative for chest pain and palpitations.  Gastrointestinal:  Negative for abdominal pain, diarrhea, nausea and vomiting.  Genitourinary: Negative.  Negative for dysuria and hematuria.  Skin: Negative.  Negative for rash.  Neurological: Negative.  Negative for dizziness and headaches.  All other systems reviewed and are negative.   Vitals:   08/04/23 0836  BP: 120/80  Pulse: (!) 54  Temp: 98 F (36.7 C)  SpO2: 98%    Physical Exam Vitals reviewed.  Constitutional:  Appearance: Normal appearance.  HENT:     Head: Normocephalic.     Right Ear: Tympanic membrane, ear canal and external ear normal.     Left Ear: Tympanic membrane, ear canal and external ear normal.     Mouth/Throat:     Mouth: Mucous membranes are moist.     Pharynx: Oropharynx is clear.   Eyes:     Extraocular Movements: Extraocular movements intact.     Conjunctiva/sclera: Conjunctivae normal.     Pupils: Pupils are equal, round, and reactive to light.    Cardiovascular:     Rate and Rhythm: Normal rate and regular rhythm.     Pulses: Normal pulses.     Heart sounds: Normal heart sounds.  Pulmonary:     Effort:  Pulmonary effort is normal.     Breath sounds: Normal breath sounds.   Musculoskeletal:     Cervical back: No tenderness.  Lymphadenopathy:     Cervical: No cervical adenopathy.   Skin:    General: Skin is warm and dry.     Capillary Refill: Capillary refill takes less than 2 seconds.   Neurological:     General: No focal deficit present.     Mental Status: He is alert and oriented to person, place, and time.   Psychiatric:        Mood and Affect: Mood normal.        Behavior: Behavior normal.      ASSESSMENT & PLAN: Problem List Items Addressed This Visit       Cardiovascular and Mediastinum   Essential hypertension   BP Readings from Last 3 Encounters:  08/04/23 120/80  07/25/23 120/82  03/14/23 124/74  Well-controlled hypertension. Continue lisinopril  10 mg daily.          Relevant Orders   Comprehensive metabolic panel with GFR     Digestive   Chronic superficial gastritis without bleeding   Other Visit Diagnoses       Routine general medical examination at a health care facility    -  Primary   Relevant Orders   CBC with Differential/Platelet   Comprehensive metabolic panel with GFR   Hemoglobin A1c   Lipid panel     Screening for deficiency anemia       Relevant Orders   CBC with Differential/Platelet     Screening for lipoid disorders       Relevant Orders   Lipid panel     Screening for endocrine, metabolic and immunity disorder       Relevant Orders   Comprehensive metabolic panel with GFR   Hemoglobin A1c      Modifiable risk factors discussed with patient. Anticipatory guidance according to age provided. The following topics were also discussed: Social Determinants of Health Smoking.  Non-smoker Diet and nutrition Benefits of exercise Cancer family history review Vaccinations review and recommendations Cardiovascular risk assessment and need for blood work Mental health including depression and anxiety Fall and accident  prevention  Patient Instructions  Mantenimiento de Radiographer, therapeutic en los hombres Health Maintenance, Male Adoptar un estilo de vida saludable y recibir atencin preventiva son importantes para promover la salud y Counsellor. Consulte al mdico sobre: El esquema adecuado para hacerse pruebas y exmenes peridicos. Cosas que puede hacer por su cuenta para prevenir enfermedades y [place] sano. Qu debo saber sobre la dieta, el peso y el ejercicio? Consuma una dieta saludable  Consuma una dieta que incluya muchas verduras, frutas, productos lcteos con bajo contenido de  grasa y protenas magras. No consuma muchos alimentos ricos en grasas slidas, azcares agregados o sodio. Mantenga un peso saludable El ndice de masa muscular [facility]) es una medida que puede utilizarse para identificar posibles problemas de [place]. Proporciona una estimacin de la grasa corporal basndose en el peso y la altura. Su mdico puede ayudarle a determinar su IMC y a Personnel officer o Pharmacologist un peso saludable. Haga ejercicio con regularidad Haga ejercicio con regularidad. Esta es una de las prcticas ms importantes que puede hacer por su salud. La [employer] de los adultos deben seguir estas pautas: Education officer, environmental, al menos, 150 minutos de actividad fsica por semana. El ejercicio debe aumentar la frecuencia cardaca y Media planner transpirar (ejercicio de intensidad moderada). Hacer ejercicios de fortalecimiento por lo [facility] por semana. Agregue esto a su plan de ejercicio de intensidad moderada. Pase menos tiempo sentado. Incluso la actividad fsica ligera puede ser beneficiosa. Controle sus niveles de colesterol y lpidos en la sangre Comience a realizarse anlisis de lpidos y Oncologist en la sangre a los 20 aos y luego reptalos cada 5 aos. Es posible que Insurance underwriter los niveles de colesterol con mayor frecuencia si: Sus niveles de lpidos y colesterol son altos. Es mayor de 40 aos. Presenta un alto riesgo de padecer  enfermedades cardacas. Qu debo saber sobre las pruebas de deteccin del cncer? Muchos tipos de cncer pueden detectarse de manera temprana y, a menudo, pueden prevenirse. Segn su historia clnica y sus antecedentes familiares, es posible que deba realizarse pruebas de deteccin del cncer en diferentes edades. Esto puede incluir pruebas de deteccin de lo siguiente: Building services engineer. Cncer de prstata. Cncer de piel. Cncer de pulmn. Qu debo saber sobre la enfermedad cardaca, la diabetes y la hipertensin arterial? Presin arterial y enfermedad cardaca La hipertensin arterial causa enfermedades cardacas y [place] el riesgo de accidente cerebrovascular. Es ms probable que esto se manifieste en las personas que tienen lecturas de presin arterial alta o tienen sobrepeso. Hable con el mdico sobre sus valores de presin arterial deseados. Hgase controlar la presin arterial: Cada 3 a 5 aos si tiene entre 18 y 81 aos. Todos los aos si es mayor de 40 aos. Si tiene entre 65 y 44 aos y es fumador o Insurance underwriter, pregntele al mdico si debe realizarse una prueba de deteccin de aneurisma artico abdominal (AAA) por nica vez. Diabetes Realcese exmenes de deteccin de la diabetes con regularidad. Este anlisis revisa el nivel de azcar en la sangre en [place]. Hgase las pruebas de deteccin: Cada tres aos despus de los 45 aos de edad si tiene un peso normal y un bajo riesgo de padecer diabetes. Con ms frecuencia y a partir de [place] edad inferior si tiene sobrepeso o un alto riesgo de padecer diabetes. Qu debo saber sobre la prevencin de infecciones? Hepatitis B Si tiene un riesgo ms alto de contraer hepatitis B, debe someterse a un examen de deteccin de este virus. Hable con el mdico para averiguar si tiene riesgo de contraer la infeccin por hepatitis B. Hepatitis C Se recomienda un anlisis de [place] para: Todos los que nacieron entre 1945 y [phone]. Todas las personas  que tengan un riesgo de haber contrado hepatitis C. Enfermedades de transmisin sexual (ETS) Debe realizarse pruebas de deteccin de ITS todos los aos, incluidas la gonorrea y la clamidia, si: Es sexualmente activo y es menor de [address]. Es mayor de [address], y Public affairs consultant informa que corre riesgo de tener este tipo de  infecciones. La actividad sexual ha cambiado desde que le hicieron la ltima prueba de deteccin y tiene un riesgo mayor de tener clamidia o Copy. Pregntele al mdico si usted tiene riesgo. Pregntele al mdico si usted tiene un alto riesgo de Primary school teacher VIH. El mdico tambin puede recomendarle un medicamento recetado para ayudar a evitar la infeccin por el VIH. Si elige tomar medicamentos para prevenir el VIH, primero debe [employer] de deteccin del VIH. Luego debe hacerse anlisis cada 3 meses mientras est tomando los medicamentos. Siga estas indicaciones en su casa: Consumo de alcohol No beba alcohol si el mdico se lo prohbe. Si bebe alcohol: Limite la cantidad que consume de 0 a 2 bebidas por da. Sepa cunta cantidad de alcohol hay en las bebidas que toma. En los [address], una medida equivale a una botella de cerveza de 12 oz (355 ml), un vaso de vino de 5 oz (148 ml) o un vaso de una bebida alcohlica de alta graduacin de 1 oz (44 ml). Estilo de vida No consuma ningn producto que contenga nicotina o tabaco. Estos productos incluyen cigarrillos, tabaco para Theatre manager y aparatos de vapeo, como los cigarrillos electrnicos. Si necesita ayuda para dejar de consumir estos productos, consulte al mdico. No consuma drogas. No comparta agujas. Solicite ayuda a su mdico si necesita apoyo o informacin para abandonar las drogas. Indicaciones generales Realcese los estudios de rutina de [address], dentales y de Wellsite geologist. Mantngase al da con las vacunas. Infrmele a su mdico si: Se siente deprimido con frecuencia. Alguna vez ha sido vctima de maltrato o  no se siente seguro en su casa. Resumen Adoptar un estilo de vida saludable y recibir atencin preventiva son importantes para promover la salud y Counsellor. Siga las instrucciones del mdico acerca de una dieta saludable, el ejercicio y la realizacin de pruebas o exmenes para Hotel manager. Siga las instrucciones del mdico con respecto al control del colesterol y la presin arterial. Esta informacin no tiene Theme park manager el consejo del mdico. Asegrese de hacerle al mdico cualquier pregunta que tenga. Document Revised: 07/09/2020 Document Reviewed: 07/09/2020 Elsevier Patient Education  2024 Elsevier Inc.      Maryagnes Small, MD [facility]

## 2023-10-10 NOTE — Progress Notes (Unsigned)
    [name] [address] [place] [address] Phone: [phone]   Assessment and Plan:     1. 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  Could consider epidural CSI if appropriate based on imaging   Subjective:   I, Garrett Miller, am serving as a Neurosurgeon for Doctor Morene Mace  Chief Complaint: back and leg pain   HPI:   10/11/2023 Patient is  38 year old male with back and leg pain. Patient states intermittent low back pain for 2 weeks. Pain has increased last 2 weeks. Tylenol  and ibu don't help.does endorse numbness and tingling. Pain radiates from low back down to back of knee   Relevant Historical Information: Hypertension, chronic gastritis  Additional pertinent review of systems negative.   Current Outpatient Medications:    methylPREDNISolone  (MEDROL  DOSEPAK) 4 MG TBPK tablet, Take 6 tablets on day 1.  Take 5 tablets on day 2.  Take 4 tablets on day 3.  Take 3 tablets on day 4.  Take 2 tablets on day 5.  Take 1 tablet on day 6., Disp: 21 tablet, Rfl: 0   dicyclomine  (BENTYL ) 20 MG tablet, Take 1 tablet (20 mg total) by mouth 3 (three) times daily before meals for 2 days. (Patient not taking: Reported on 08/04/2023), Disp: 6 tablet, Rfl: 1   lisinopril  (ZESTRIL ) 10 MG tablet, TAKE 1 TABLET(10 MG) BY MOUTH DAILY, Disp: 90 tablet, Rfl: 0   pantoprazole   (PROTONIX ) 40 MG tablet, Take 1 tablet (40 mg total) by mouth daily., Disp: 60 tablet, Rfl: 1   Objective:     Vitals:   10/11/23 1515  Pulse: 61  SpO2: 98%  Weight: 176 lb (79.8 kg)  Height: 5' 10 (1.778 m)      Body mass index is 25.25 kg/m.    Physical Exam:    Gen: Appears well, nad, nontoxic and pleasant Psych: Alert and oriented, appropriate mood and affect Neuro: sensation intact, strength is 5/5 in upper and lower extremities, muscle tone wnl Skin: no susupicious lesions or rashes   Back - Normal skin, Spine with normal alignment and no deformity.   No tenderness to vertebral process palpation.   Left lumbar paraspinous muscles are mildly tender and without spasm Mild TTP gluteal musculature Straight leg raise positive on left Trendelenberg negative Piriformis Test negative Gait normal  Pain with lumbar extension    Electronically signed by:  Odis Mace D.CLEMENTEEN [name] 3:32 PM 10/11/23

## 2023-10-11 ENCOUNTER — Ambulatory Visit (INDEPENDENT_AMBULATORY_CARE_PROVIDER_SITE_OTHER): Admitting: Sports Medicine

## 2023-10-11 VITALS — HR 61 | Ht 70.0 in | Wt 176.0 lb

## 2023-10-11 DIAGNOSIS — M5441 Lumbago with sciatica, right side: Secondary | ICD-10-CM

## 2023-10-11 DIAGNOSIS — M5442 Lumbago with sciatica, left side: Secondary | ICD-10-CM | POA: Diagnosis not present

## 2023-10-11 DIAGNOSIS — G8929 Other chronic pain: Secondary | ICD-10-CM | POA: Diagnosis not present

## 2023-10-11 MED ORDER — METHYLPREDNISOLONE 4 MG PO TBPK: ORAL_TABLET | ORAL | 0 refills | Status: AC

## 2023-10-11 NOTE — Patient Instructions (Signed)
 Prednisone dos pak   MRI lumbar   Follow up 5 days after MRI to discuss results

## 2023-10-18 ENCOUNTER — Telehealth: Payer: Self-pay

## 2023-10-18 NOTE — Telephone Encounter (Signed)
 MRI lumbar denied.   1) patient must have completed 6 full weeks of PT, Medication, injections, and/ or HEP  2) treatment must have occurred in the last three months  3)symptoms must be the same or worse after treatment  4) contact with provider after completed treatment to discuss failure of treatment   Last appointment was 10/11/23  Appointment before that was 12/16/2022  I cannot get it approved since it was not done in the last 3 months. Patient would need to follow up in clinic 6 weeks after 10/11/23 appointment with same or worse symptoms. That would be 11/22/23.  I tried to set up a P2P but was denied and told it had to be submitted as an appeal, but without this information above I cannot get it approved

## 2023-11-02 ENCOUNTER — Other Ambulatory Visit: Payer: Self-pay | Admitting: Emergency Medicine

## 2023-11-02 DIAGNOSIS — I10 Essential (primary) hypertension: Secondary | ICD-10-CM

## 2023-11-08 ENCOUNTER — Other Ambulatory Visit: Payer: Self-pay | Admitting: Emergency Medicine

## 2023-11-08 DIAGNOSIS — K2900 Acute gastritis without bleeding: Secondary | ICD-10-CM

## 2023-11-16 NOTE — Progress Notes (Unsigned)
 Ben Jackson D.[name] [address] [place] [address] Phone: [phone]   Assessment and Plan:     1. Chronic left-sided low back pain with left-sided sciatica (Primary) -Chronic with exacerbation, subsequent visit - Continued significant low back pain with left-sided radicular symptoms consistent with lumbar etiology of sciatica.  Concern for disc herniation. - Recommend lumbar MRI based on no improvement despite greater than 6 weeks of conservative therapy within the last 3 months that has included physical therapy, patient directed home exercise program, prednisone course.  Patient continues to experience pain levels of 9/10, and pain that affects day-to-day activities. - Use Tylenol  500 to 1000 mg tablets 2-3 times a day for day-to-day pain relief - Start gabapentin 100 mg daily as needed and can titrate up to a maximum of gabapentin 200 mg twice daily as needed.  Prescription provided - Continue HEP   Pertinent previous records reviewed include none   Follow Up: 5 days after MRI to discuss treatment plan and review MRI.  Could consider epidural CSI if appropriate based on imaging   Subjective:   I, Sulaiman Imbert, am serving as a Neurosurgeon for Doctor Morene Mace   Chief Complaint: back and leg pain    HPI:    10/11/2023 Patient is  38 year old male with back and leg pain. Patient states intermittent low back pain for 2 weeks. Pain has increased last 2 weeks. Tylenol  and ibu don't help.does endorse numbness and tingling. Pain radiates from low back down to back of knee   11/17/2023 Patient states he is the same. No change.   Relevant Historical Information: Hypertension, chronic gastritis  Additional pertinent review of systems negative.   Current Outpatient Medications:    gabapentin (NEURONTIN) 100 MG capsule, Take 1 capsule (100 mg total) by mouth 2 (two) times daily., Disp: 60 capsule, Rfl: 0   dicyclomine  (BENTYL ) 20  MG tablet, Take 1 tablet (20 mg total) by mouth 3 (three) times daily before meals for 2 days. (Patient not taking: Reported on 08/04/2023), Disp: 6 tablet, Rfl: 1   lisinopril  (ZESTRIL ) 10 MG tablet, TAKE 1 TABLET(10 MG) BY MOUTH DAILY, Disp: 90 tablet, Rfl: 3   methylPREDNISolone  (MEDROL  DOSEPAK) 4 MG TBPK tablet, Take 6 tablets on day 1.  Take 5 tablets on day 2.  Take 4 tablets on day 3.  Take 3 tablets on day 4.  Take 2 tablets on day 5.  Take 1 tablet on day 6., Disp: 21 tablet, Rfl: 0   pantoprazole  (PROTONIX ) 40 MG tablet, TAKE 1 TABLET(40 MG) BY MOUTH DAILY, Disp: 60 tablet, Rfl: 1   Objective:     Vitals:   11/17/23 0747  Pulse: 60  SpO2: 99%  Weight: 183 lb (83 kg)  Height: 5' 10 (1.778 m)      Body mass index is 26.26 kg/m.    Physical Exam:    Gen: Appears well, nad, nontoxic and pleasant Psych: Alert and oriented, appropriate mood and affect Neuro: sensation intact, strength is 5/5 in upper and lower extremities, muscle tone wnl Skin: no susupicious lesions or rashes   Back - Normal skin, Spine with normal alignment and no deformity.   No tenderness to vertebral process palpation.   Left lumbar paraspinous muscles are mildly tender and without spasm Mild TTP gluteal musculature Straight leg raise positive on left Trendelenberg negative Piriformis Test negative Gait normal  Pain with lumbar extension    Electronically signed by:  Odis  Leonce BIRCH.[name] 8:02 AM 11/17/23

## 2023-11-17 ENCOUNTER — Ambulatory Visit: Admitting: Sports Medicine

## 2023-11-17 VITALS — HR 60 | Ht 70.0 in | Wt 183.0 lb

## 2023-11-17 DIAGNOSIS — M5442 Lumbago with sciatica, left side: Secondary | ICD-10-CM

## 2023-11-17 DIAGNOSIS — G8929 Other chronic pain: Secondary | ICD-10-CM

## 2023-11-17 MED ORDER — GABAPENTIN 100 MG PO CAPS: 100.0000 mg | ORAL_CAPSULE | Freq: Two times a day (BID) | ORAL | 0 refills | Status: AC

## 2023-11-17 NOTE — Patient Instructions (Addendum)
 Mri lumbar   Tylenol  [phone] mg 2-3 times a day for pain relief   Follow up 5 days later to discuss results   Gabapentin 200 mg 2x a day as needed

## 2023-11-30 ENCOUNTER — Ambulatory Visit
Admission: RE | Admit: 2023-11-30 | Discharge: 2023-11-30 | Disposition: A | Discharge status: Home / Self Care | Source: Ambulatory Visit | Attending: Sports Medicine | Admitting: Sports Medicine

## 2023-11-30 DIAGNOSIS — G8929 Other chronic pain: Secondary | ICD-10-CM

## 2023-12-05 ENCOUNTER — Ambulatory Visit: Payer: Self-pay | Admitting: Sports Medicine

## 2023-12-06 NOTE — Progress Notes (Unsigned)
 [facility] [address] [place] [address] Phone: [phone]   Assessment and Plan:     1. Chronic left-sided low back pain with left-sided sciatica 2. Lumbar disc herniation with radiculopathy -Chronic with exacerbation, subsequent visit - Continued low back pain with left-sided radicular symptoms consistent with lumbar disc herniation as seen on MRI - Reviewed MRI of lumbar spine from 11/30/2023 which showed moderate left subarticular recess stenosis at L5-S1 due to disc bulging - Recommend epidural CSI to left-sided L5-S1 - Continue HEP for low back and core - May continue gabapentin 100-200 mg daily as needed for neurologic pain - Use Tylenol  500 to 1000 mg tablets 2-3 times a day for day-to-day pain relief  -No significant relief with NSAIDs and recent gastritis likely related to frequent NSAID use, so we will avoid additional NSAIDs at this time  Pertinent previous records reviewed include lumbar MRI 11/30/2023   Follow Up: 2 weeks after epidural to review benefit.  Could consider repeat if necessary   Subjective:   I, Garrett Miller, am serving as a Neurosurgeon for Doctor Garrett Miller   Chief Complaint: back and leg pain    HPI:    10/11/2023 Patient is  38 year old male with back and leg pain. Patient states intermittent low back pain for 2 weeks. Pain has increased last 2 weeks. Tylenol  and ibu don't help.does endorse numbness and tingling. Pain radiates from low back down to back of knee    11/17/2023 Patient states he is the same. No change.  12/07/2023 Patient states he is the same    Relevant Historical Information: Hypertension, chronic gastritis    Additional pertinent review of systems negative.   Current Outpatient Medications:    dicyclomine  (BENTYL ) 20 MG tablet, Take 1 tablet (20 mg total) by mouth 3 (three) times daily before meals for 2 days. (Patient not taking: Reported on 08/04/2023),  Disp: 6 tablet, Rfl: 1   gabapentin (NEURONTIN) 100 MG capsule, Take 1 capsule (100 mg total) by mouth 2 (two) times daily., Disp: 60 capsule, Rfl: 0   lisinopril  (ZESTRIL ) 10 MG tablet, TAKE 1 TABLET(10 MG) BY MOUTH DAILY, Disp: 90 tablet, Rfl: 3   methylPREDNISolone  (MEDROL  DOSEPAK) 4 MG TBPK tablet, Take 6 tablets on day 1.  Take 5 tablets on day 2.  Take 4 tablets on day 3.  Take 3 tablets on day 4.  Take 2 tablets on day 5.  Take 1 tablet on day 6., Disp: 21 tablet, Rfl: 0   pantoprazole  (PROTONIX ) 40 MG tablet, TAKE 1 TABLET(40 MG) BY MOUTH DAILY, Disp: 60 tablet, Rfl: 1   Objective:     Vitals:   12/07/23 1456  Pulse: 73  SpO2: 98%  Weight: 189 lb (85.7 kg)  Height: 5' 10 (1.778 m)      Body mass index is 27.12 kg/m.    Physical Exam:    Gen: Appears well, nad, nontoxic and pleasant Psych: Alert and oriented, appropriate mood and affect Neuro: sensation intact, strength is 5/5 in upper and lower extremities, muscle tone wnl Skin: no susupicious lesions or rashes   Back - Normal skin, Spine with normal alignment and no deformity.   No tenderness to vertebral process palpation.   Left lumbar paraspinous muscles are mildly tender and without spasm Mild TTP gluteal musculature Straight leg raise positive on left Trendelenberg negative Piriformis Test negative Gait normal  Pain with lumbar extension    Electronically signed by:  [facility] 3:10 PM 12/07/23

## 2023-12-07 ENCOUNTER — Ambulatory Visit (INDEPENDENT_AMBULATORY_CARE_PROVIDER_SITE_OTHER): Admitting: Sports Medicine

## 2023-12-07 DIAGNOSIS — G8929 Other chronic pain: Secondary | ICD-10-CM

## 2023-12-07 DIAGNOSIS — M5116 Intervertebral disc disorders with radiculopathy, lumbar region: Secondary | ICD-10-CM

## 2023-12-07 NOTE — Patient Instructions (Addendum)
 Epidural Left L5-S1  Low back HEP   Follow up 2 weeks after to discuss results

## 2023-12-15 ENCOUNTER — Encounter: Payer: Self-pay | Admitting: Sports Medicine

## 2023-12-22 NOTE — Discharge Instructions (Signed)

## 2023-12-23 ENCOUNTER — Ambulatory Visit
Admission: RE | Admit: 2023-12-23 | Discharge: 2023-12-23 | Disposition: A | Discharge status: Home / Self Care | Source: Ambulatory Visit | Attending: Sports Medicine | Admitting: Sports Medicine

## 2023-12-23 DIAGNOSIS — G8929 Other chronic pain: Secondary | ICD-10-CM

## 2023-12-23 DIAGNOSIS — M5116 Intervertebral disc disorders with radiculopathy, lumbar region: Secondary | ICD-10-CM

## 2023-12-23 MED ORDER — IOPAMIDOL (ISOVUE-M 200) INJECTION 41%
1.0000 mL | Freq: Once | INTRAMUSCULAR | Status: AC
  Administered 2023-12-23: 1 mL via EPIDURAL

## 2023-12-23 MED ORDER — METHYLPREDNISOLONE ACETATE 40 MG/ML INJ SUSP (RADIOLOG
80.0000 mg | Freq: Once | INTRAMUSCULAR | Status: AC
  Administered 2023-12-23: 80 mg via EPIDURAL

## 2024-01-05 NOTE — Progress Notes (Deleted)
    [facility] [address] [place] [address] Phone: [phone]   Assessment and Plan:     ***    Pertinent previous records reviewed include ***   Follow Up: ***     Subjective:   I, Garrett Miller, am serving as a neurosurgeon for Doctor [facility]   Chief Complaint: back and leg pain    HPI:    10/11/2023 Patient is  38 year old male with back and leg pain. Patient states intermittent low back pain for 2 weeks. Pain has increased last 2 weeks. Tylenol  and ibu don't help.does endorse numbness and tingling. Pain radiates from low back down to back of knee    11/17/2023 Patient states he is the same. No change.   12/07/2023 Patient states he is the same   01/06/2024 Patient states   Relevant Historical Information: Hypertension, chronic gastritis    Additional pertinent review of systems negative.   Current Outpatient Medications:    dicyclomine  (BENTYL ) 20 MG tablet, Take 1 tablet (20 mg total) by mouth 3 (three) times daily before meals for 2 days. (Patient not taking: Reported on 08/04/2023), Disp: 6 tablet, Rfl: 1   gabapentin (NEURONTIN) 100 MG capsule, Take 1 capsule (100 mg total) by mouth 2 (two) times daily., Disp: 60 capsule, Rfl: 0   lisinopril  (ZESTRIL ) 10 MG tablet, TAKE 1 TABLET(10 MG) BY MOUTH DAILY, Disp: 90 tablet, Rfl: 3   methylPREDNISolone  (MEDROL  DOSEPAK) 4 MG TBPK tablet, Take 6 tablets on day 1.  Take 5 tablets on day 2.  Take 4 tablets on day 3.  Take 3 tablets on day 4.  Take 2 tablets on day 5.  Take 1 tablet on day 6., Disp: 21 tablet, Rfl: 0   pantoprazole  (PROTONIX ) 40 MG tablet, TAKE 1 TABLET(40 MG) BY MOUTH DAILY, Disp: 60 tablet, Rfl: 1   Objective:     There were no vitals filed for this visit.    There is no height or weight on file to calculate BMI.    Physical Exam:    ***   Electronically signed by:  [facility] 7:28 AM  01/05/24

## 2024-01-06 ENCOUNTER — Ambulatory Visit: Admitting: Sports Medicine

## 2024-01-16 NOTE — Progress Notes (Deleted)
    Ben Jackson D.[name] [address] [place] [address] Phone: [phone]   Assessment and Plan:     ***    Pertinent previous records reviewed include ***   Follow Up: ***     Subjective:    Chief Complaint: Left leg pain   HPI:   10/11/2023 Patient is  38 year old male with back and leg pain. Patient states intermittent low back pain for 2 weeks. Pain has increased last 2 weeks. Tylenol  and ibu don't help.does endorse numbness and tingling. Pain radiates from low back down to back of knee    11/17/2023 Patient states he is the same. No change.   12/07/2023 Patient states he is the same  01/16/24 Today patient states   Relevant Historical Information: Hypertension, chronic gastritis   Additional pertinent review of systems negative.   Current Outpatient Medications:    dicyclomine  (BENTYL ) 20 MG tablet, Take 1 tablet (20 mg total) by mouth 3 (three) times daily before meals for 2 days. (Patient not taking: Reported on 08/04/2023), Disp: 6 tablet, Rfl: 1   gabapentin  (NEURONTIN ) 100 MG capsule, Take 1 capsule (100 mg total) by mouth 2 (two) times daily., Disp: 60 capsule, Rfl: 0   lisinopril  (ZESTRIL ) 10 MG tablet, TAKE 1 TABLET(10 MG) BY MOUTH DAILY, Disp: 90 tablet, Rfl: 3   methylPREDNISolone  (MEDROL  DOSEPAK) 4 MG TBPK tablet, Take 6 tablets on day 1.  Take 5 tablets on day 2.  Take 4 tablets on day 3.  Take 3 tablets on day 4.  Take 2 tablets on day 5.  Take 1 tablet on day 6., Disp: 21 tablet, Rfl: 0   pantoprazole  (PROTONIX ) 40 MG tablet, TAKE 1 TABLET(40 MG) BY MOUTH DAILY, Disp: 60 tablet, Rfl: 1   Objective:     There were no vitals filed for this visit.    There is no height or weight on file to calculate BMI.    Physical Exam:    ***   Electronically signed by:  Odis Mace D.[name] 3:12 PM 01/16/24

## 2024-01-17 ENCOUNTER — Ambulatory Visit: Admitting: Sports Medicine

## 2024-01-17 NOTE — Progress Notes (Unsigned)
 [name] [address] [place] [address] Phone: [phone]   Assessment and Plan:     1. Chronic left-sided low back pain with left-sided sciatica (Primary) 2. Lumbar disc herniation with radiculopathy -Chronic with exacerbation, subsequent visit - Overall improvement after epidural CSI, however continued low back pain with left-sided radicular symptoms and new right sided radicular symptoms - Left-sided L5-S1 epidural CSI performed on 12/23/2023 provided patient with 50% relief, decreased need for pain medication, and improved function.  However with patient continued to experience radicular symptoms, I recommend repeat epidural CSI at left-sided L5-S1.  Order placed - Continue HEP for low back and core - Continue gabapentin  100 to 200 mg daily as needed for neurologic pain - Use Tylenol  500 to 1000 mg tablets 2-3 times a day for day-to-day pain relief - No significant relief with prior NSAID use and history of gastritis with NSAID use    Pertinent previous records reviewed include procedure note   Follow Up: 2 weeks after epidural to review benefit.  Could consider third epidural if necessary versus neuro surgery referral   Subjective:   I, Moenique Maybell, am serving as a neurosurgeon for Doctor Morene Mace  Chief Complaint: Left leg pain   HPI:   10/11/2023 Patient is  38 year old male with back and leg pain. Patient states intermittent low back pain for 2 weeks. Pain has increased last 2 weeks. Tylenol  and ibu don't help.does endorse numbness and tingling. Pain radiates from low back down to back of knee    11/17/2023 Patient states he is the same. No change.   12/07/2023 Patient states he is the same  01/18/24 Today patient states epidural helped with the lower back pain. Pain down the leg is still present thigh to calf has numbness. Right leg is compensating , he notes weakness and pain   Relevant  Historical Information: Hypertension, chronic gastritis   Additional pertinent review of systems negative.   Current Outpatient Medications:    dicyclomine  (BENTYL ) 20 MG tablet, Take 1 tablet (20 mg total) by mouth 3 (three) times daily before meals for 2 days. (Patient not taking: Reported on 08/04/2023), Disp: 6 tablet, Rfl: 1   gabapentin  (NEURONTIN ) 100 MG capsule, Take 1 capsule (100 mg total) by mouth 2 (two) times daily., Disp: 60 capsule, Rfl: 0   lisinopril  (ZESTRIL ) 10 MG tablet, TAKE 1 TABLET(10 MG) BY MOUTH DAILY, Disp: 90 tablet, Rfl: 3   methylPREDNISolone  (MEDROL  DOSEPAK) 4 MG TBPK tablet, Take 6 tablets on day 1.  Take 5 tablets on day 2.  Take 4 tablets on day 3.  Take 3 tablets on day 4.  Take 2 tablets on day 5.  Take 1 tablet on day 6., Disp: 21 tablet, Rfl: 0   pantoprazole  (PROTONIX ) 40 MG tablet, TAKE 1 TABLET(40 MG) BY MOUTH DAILY, Disp: 60 tablet, Rfl: 1   Objective:     Vitals:   01/18/24 0802  BP: 120/82  Pulse: (!) 59  SpO2: 99%  Weight: 185 lb (83.9 kg)  Height: 5' 10 (1.778 m)      Body mass index is 26.54 kg/m.    Physical Exam:    Gen: Appears well, nad, nontoxic and pleasant Psych: Alert and oriented, appropriate mood and affect Neuro: sensation intact, strength is 5/5 in upper and lower extremities, muscle tone wnl Skin: no susupicious lesions or rashes   Back - Normal skin, Spine with normal alignment and no deformity.  No tenderness to vertebral process palpation.   Left lumbar paraspinous muscles are mildly tender and without spasm Mild TTP gluteal musculature Straight leg raise positive on left Trendelenberg negative Piriformis Test negative Gait normal  Pain with lumbar extension  Electronically signed by:  Odis Mace [name] 8:18 AM 01/18/24

## 2024-01-18 ENCOUNTER — Ambulatory Visit: Admitting: Sports Medicine

## 2024-01-18 VITALS — BP 120/82 | HR 59 | Ht 70.0 in | Wt 185.0 lb

## 2024-01-18 DIAGNOSIS — M5116 Intervertebral disc disorders with radiculopathy, lumbar region: Secondary | ICD-10-CM

## 2024-01-18 DIAGNOSIS — G8929 Other chronic pain: Secondary | ICD-10-CM | POA: Diagnosis not present

## 2024-01-18 NOTE — Patient Instructions (Signed)
 Epidural left L5-S1  Follow up 2 weeks after to discuss result

## 2024-01-20 ENCOUNTER — Ambulatory Visit
Admission: RE | Admit: 2024-01-20 | Discharge: 2024-01-20 | Disposition: A | Discharge status: Home / Self Care | Source: Ambulatory Visit | Attending: Sports Medicine | Admitting: Sports Medicine

## 2024-01-20 DIAGNOSIS — M5116 Intervertebral disc disorders with radiculopathy, lumbar region: Secondary | ICD-10-CM

## 2024-01-20 DIAGNOSIS — G8929 Other chronic pain: Secondary | ICD-10-CM

## 2024-01-20 MED ORDER — IOPAMIDOL (ISOVUE-M 200) INJECTION 41%
1.0000 mL | Freq: Once | INTRAMUSCULAR | Status: AC
  Administered 2024-01-20: 1 mL via EPIDURAL

## 2024-01-20 MED ORDER — METHYLPREDNISOLONE ACETATE 40 MG/ML INJ SUSP (RADIOLOG
80.0000 mg | Freq: Once | INTRAMUSCULAR | Status: AC
  Administered 2024-01-20: 80 mg via EPIDURAL

## 2024-01-20 NOTE — Discharge Instructions (Signed)

## 2024-02-06 NOTE — Progress Notes (Unsigned)
 "               [facility] [address] [place] [address] Phone: [phone]   Assessment and Plan:     1. Chronic left-sided low back pain with left-sided sciatica (Primary) 2. Lumbar disc herniation with radiculopathy 3. Somatic dysfunction of lumbar region 4. Somatic dysfunction of pelvic region 5. Somatic dysfunction of sacral region - Chronic with exacerbation, subsequent visit - Overall significant improvement in radicular symptoms, however continued episodes of flared low back pain, now without radiation - Patient has had epidural CSI x 2 on 12/23/2023 and 01/20/2024 with 80% percent improvement, decreased need for pain medication and improved function.  Could consider repeat epidural in the future, though I do not feel the patient currently needs epidural injection based on nearly resolved radicular symptoms - Continue HEP and start new physical therapy for low back and core - Continue gabapentin  100 to 100 mg daily as needed for neurologic pain - No significant relief with NSAID use in the past and history of gastritis with NSAID use, so will not repeat NSAID course at this time  - Patient elected for initial OMT today.  Tolerated well per note below. - Decision today to treat with OMT was based on Physical Exam  After verbal consent patient was treated with HVLA (high velocity low amplitude), ME (muscle energy), FPR (flex positional release), ST (soft tissue), PC/PD (Pelvic Compression/ Pelvic Decompression) techniques in sacrum, lumbar, and pelvic areas. Patient tolerated the procedure well with improvement in symptoms.  Patient educated on potential side effects of soreness and recommended to rest, hydrate, and use Tylenol  as needed for pain control.   Pertinent previous records reviewed include epidural procedure notes   Follow Up: 4 to 6 weeks for reevaluation.  Could consider repeat OMT.  Could consider repeat epidural if radicular  symptoms return.   Subjective:   I, Garrett Miller, am serving as a neurosurgeon for Doctor Morene Mace   Chief Complaint: Left leg pain    HPI:    10/11/2023 Patient is  38 year old male with back and leg pain. Patient states intermittent low back pain for 2 weeks. Pain has increased last 2 weeks. Tylenol  and ibu don't help.does endorse numbness and tingling. Pain radiates from low back down to back of knee    11/17/2023 Patient states he is the same. No change.   12/07/2023 Patient states he is the same   01/18/24 Today patient states epidural helped with the lower back pain. Pain down the leg is still present thigh to calf has numbness. Right leg is compensating , he notes weakness and pain    02/07/2024 Patient states still has pain low back and glute. Pain is flared when he is at work being active    Relevant Historical Information: Hypertension, chronic gastritis     Additional pertinent review of systems negative.  Current Medications[1]   Objective:     Vitals:   02/07/24 1549  BP: 136/84  Pulse: 62  SpO2: 98%  Weight: 189 lb (85.7 kg)  Height: 5' 10 (1.778 m)      Body mass index is 27.12 kg/m.    Physical Exam:    Gen: Appears well, nad, nontoxic and pleasant Psych: Alert and oriented, appropriate mood and affect Neuro: sensation intact, strength is 5/5 in upper and lower extremities, muscle tone wnl Skin: no susupicious lesions or rashes   Back - Normal skin, Spine with  normal alignment and no deformity.   No tenderness to vertebral process palpation.   Left lumbar paraspinous muscles are mildly tender and without spasm Mild TTP gluteal musculature Straight leg raise positive on left Trendelenberg negative Piriformis Test negative Gait normal  Pain with lumbar extension  General: Well-appearing, cooperative, sitting comfortably in no acute distress.   OMT Physical Exam:  ASIS Compression Test: Positive Right Sacrum: Positive sphinx, TTP  left sacral base Lumbar: TTP paraspinal, L1-3 RRSL Pelvis: Right anterior innominate   Electronically signed by:  [facility] 4:22 PM 02/07/2024     [1]  Current Outpatient Medications:    gabapentin  (NEURONTIN ) 100 MG capsule, Take 1 capsule (100 mg total) by mouth 2 (two) times daily., Disp: 60 capsule, Rfl: 0   lisinopril  (ZESTRIL ) 10 MG tablet, TAKE 1 TABLET(10 MG) BY MOUTH DAILY, Disp: 90 tablet, Rfl: 3   methylPREDNISolone  (MEDROL  DOSEPAK) 4 MG TBPK tablet, Take 6 tablets on day 1.  Take 5 tablets on day 2.  Take 4 tablets on day 3.  Take 3 tablets on day 4.  Take 2 tablets on day 5.  Take 1 tablet on day 6., Disp: 21 tablet, Rfl: 0   pantoprazole  (PROTONIX ) 40 MG tablet, TAKE 1 TABLET(40 MG) BY MOUTH DAILY, Disp: 60 tablet, Rfl: 1  "

## 2024-02-07 ENCOUNTER — Ambulatory Visit: Admitting: Sports Medicine

## 2024-02-07 VITALS — BP 136/84 | HR 62 | Ht 70.0 in | Wt 189.0 lb

## 2024-02-07 DIAGNOSIS — G8929 Other chronic pain: Secondary | ICD-10-CM

## 2024-02-07 DIAGNOSIS — M9905 Segmental and somatic dysfunction of pelvic region: Secondary | ICD-10-CM

## 2024-02-07 DIAGNOSIS — M9903 Segmental and somatic dysfunction of lumbar region: Secondary | ICD-10-CM | POA: Diagnosis not present

## 2024-02-07 DIAGNOSIS — M9904 Segmental and somatic dysfunction of sacral region: Secondary | ICD-10-CM

## 2024-02-07 DIAGNOSIS — M5116 Intervertebral disc disorders with radiculopathy, lumbar region: Secondary | ICD-10-CM

## 2024-02-07 NOTE — Patient Instructions (Signed)
 PT referral  Continue HEP  4-6 week follow up

## 2024-02-13 ENCOUNTER — Ambulatory Visit: Admitting: Emergency Medicine

## 2024-02-13 ENCOUNTER — Encounter: Payer: Self-pay | Admitting: Emergency Medicine

## 2024-02-13 VITALS — BP 122/80 | HR 60 | Temp 98.0°F | Ht 70.0 in | Wt 189.0 lb

## 2024-02-13 DIAGNOSIS — I10 Essential (primary) hypertension: Secondary | ICD-10-CM

## 2024-02-13 DIAGNOSIS — R42 Dizziness and giddiness: Secondary | ICD-10-CM | POA: Diagnosis not present

## 2024-02-13 DIAGNOSIS — B349 Viral infection, unspecified: Secondary | ICD-10-CM | POA: Diagnosis not present

## 2024-02-13 MED ORDER — MECLIZINE HCL 25 MG PO TABS: 25.0000 mg | ORAL_TABLET | Freq: Three times a day (TID) | ORAL | 0 refills | Status: AC | PRN

## 2024-02-13 NOTE — Assessment & Plan Note (Signed)
 Clinically stable and running its course without complications Symptom management discussed Advised to rest and stay well-hydrated Advised to contact the office if no better or worse during the next several days

## 2024-02-13 NOTE — Progress Notes (Signed)
 Garrett Miller 38 y.o.   Chief Complaint  Patient presents with   Dizziness    And  nausea for a week now    HISTORY OF PRESENT ILLNESS: This is a 38 y.o. male complaining of feeling dizzy and nauseous Started last week.  Had headache.  Took Excedrin with relief.  No longer having headache. Family members at home with viral illnesses Slowly getting better Took leftover meclizine  with success No other associated symptoms No other complaints or medical concerns today  Dizziness Associated symptoms include headaches and nausea. Pertinent negatives include no abdominal pain, chest pain, chills, congestion, coughing, fever, rash, sore throat or vomiting.     Prior to Admission medications  Medication Sig Start Date End Date Taking? Authorizing Provider  gabapentin  (NEURONTIN ) 100 MG capsule Take 1 capsule (100 mg total) by mouth 2 (two) times daily. 11/17/23   Leonce Katz, DO  lisinopril  (ZESTRIL ) 10 MG tablet TAKE 1 TABLET(10 MG) BY MOUTH DAILY 11/02/23   Purcell Emil Schanz, MD  methylPREDNISolone  (MEDROL  DOSEPAK) 4 MG TBPK tablet Take 6 tablets on day 1.  Take 5 tablets on day 2.  Take 4 tablets on day 3.  Take 3 tablets on day 4.  Take 2 tablets on day 5.  Take 1 tablet on day 6. 10/11/23   Leonce Katz, DO  pantoprazole  (PROTONIX ) 40 MG tablet TAKE 1 TABLET(40 MG) BY MOUTH DAILY 11/08/23   Purcell Emil Schanz, MD    Allergies[1]  Patient Active Problem List   Diagnosis Date Noted   Chronic superficial gastritis without bleeding 12/06/2018   Essential hypertension 02/10/2015   HTN (hypertension) 08/22/2013    Past Medical History:  Diagnosis Date   Hypertension     Past Surgical History:  Procedure Laterality Date   CHOLECYSTECTOMY  2013   LAPAROSCOPIC APPENDECTOMY N/A 06/24/2015   Procedure: APPENDECTOMY LAPAROSCOPIC;  Surgeon: Herlene Righter Kinsinger, MD;  Location: [facility] OR;  Service: General;  Laterality: N/A;    Social History   Socioeconomic History    Marital status: Single    Spouse name: Not on file   Number of children: 2   Years of education: Not on file   Highest education level: Not on file  Occupational History   Occupation: Warehouse  Tobacco Use   Smoking status: Never   Smokeless tobacco: Never  Vaping Use   Vaping status: Never Used  Substance and Sexual Activity   Alcohol use: Not Currently    Comment: occ   Drug use: Yes    Types: Marijuana   Sexual activity: Yes  Other Topics Concern   Not on file  Social History Narrative   Marital status: single; dating seriously x 10 years.  From [place]; [place]  [place] 10 years.      Children: one child.      Employment: naval architect.   Social Drivers of Health   Tobacco Use: Low Risk (02/13/2024)   Patient History    Smoking Tobacco Use: Never    Smokeless Tobacco Use: Never    Passive Exposure: Not on file  Financial Resource Strain: Not on file  Food Insecurity: Not on file  Transportation Needs: Not on file  Physical Activity: Not on file  Stress: Not on file  Social Connections: Not on file  Intimate Partner Violence: Not on file  Depression (PHQ2-9): Low Risk (02/13/2024)   Depression (PHQ2-9)    PHQ-2 Score: 0  Alcohol Screen: Not on file  Housing: Not on file  Utilities: Not on file  Health Literacy: Not on file    Family History  Problem Relation Age of Onset   Hypertension Mother    Colon cancer Neg Hx    Esophageal cancer Neg Hx      Review of Systems  Constitutional: Negative.  Negative for chills and fever.  HENT: Negative.  Negative for congestion and sore throat.   Respiratory: Negative.  Negative for cough and shortness of breath.   Cardiovascular: Negative.  Negative for chest pain and palpitations.  Gastrointestinal:  Positive for nausea. Negative for abdominal pain, blood in stool, diarrhea and vomiting.  Genitourinary: Negative.  Negative for dysuria and hematuria.  Skin: Negative.  Negative for rash.  Neurological:  Positive for  dizziness and headaches.  All other systems reviewed and are negative.   Vitals:   02/13/24 1527  BP: 122/80  Pulse: 60  Temp: 98 F (36.7 C)  SpO2: 98%    Physical Exam Vitals reviewed.  Constitutional:      Appearance: Normal appearance.  HENT:     Head: Normocephalic.     Right Ear: Tympanic membrane, ear canal and external ear normal.     Left Ear: Tympanic membrane, ear canal and external ear normal.     Mouth/Throat:     Mouth: Mucous membranes are moist.     Pharynx: Oropharynx is clear.  Eyes:     Extraocular Movements: Extraocular movements intact.     Conjunctiva/sclera: Conjunctivae normal.     Pupils: Pupils are equal, round, and reactive to light.  Cardiovascular:     Rate and Rhythm: Normal rate and regular rhythm.     Pulses: Normal pulses.     Heart sounds: Normal heart sounds.  Pulmonary:     Effort: Pulmonary effort is normal.     Breath sounds: Normal breath sounds.  Abdominal:     Palpations: Abdomen is soft.     Tenderness: There is no abdominal tenderness.  Musculoskeletal:     Cervical back: No tenderness.  Lymphadenopathy:     Cervical: No cervical adenopathy.  Skin:    General: Skin is warm and dry.     Capillary Refill: Capillary refill takes less than 2 seconds.  Neurological:     General: No focal deficit present.     Mental Status: He is alert and oriented to person, place, and time.  Psychiatric:        Mood and Affect: Mood normal.        Behavior: Behavior normal.      ASSESSMENT & PLAN: Problem List Items Addressed This Visit       Cardiovascular and Mediastinum   Essential hypertension   BP Readings from Last 3 Encounters:  02/13/24 122/80  02/07/24 136/84  01/20/24 127/81  Well-controlled hypertension Continue lisinopril  10 mg daily         Other   Viral illness - Primary   Clinically stable and running its course without complications Symptom management discussed Advised to rest and stay  well-hydrated Advised to contact the office if no better or worse during the next several days      Dizziness   Clinically stable.  Most likely related to recent viral illness Unremarkable physical examination No neurological deficits Differential diagnosis discussed Recommend meclizine  25 mg every 6-8 hours as needed      Relevant Medications   meclizine  (ANTIVERT ) 25 MG tablet   Patient Instructions  Mareos Dizziness Los mareos son un problema muy frecuente. Causan sensacin de inestabilidad o de desvanecimiento. Puede  sentir que se va a desmayar. Los [employer] pueden provocarle una lesin si se tropieza o se cae. Es ms frecuente sentirse mareado si es un adulto mayor. Hay muchas cosas que pueden hacer que se sienta mareado. Esto incluye lo siguiente: Medicamentos. Deshidratacin. Esto ocurre cuando no hay suficiente agua en el cuerpo. Enfermedad. Siga estas instrucciones en su casa: Comida y bebida  Beber suficiente lquido para mantener el pis (orina) de color amarillo plido. Esto evita la deshidratacin. Trate de beber ms lquidos transparentes, como agua. No beba alcohol. Trate de limitar la cantidad de cafena que consume. Trate de limitar la cantidad de sal, tambin llamada sodio, que consume. Actividad Trate de no hacer movimientos rpidos. Prese lentamente despus de estar sentado en una silla. Sostngase hasta que se sienta bien. Por la maana, sintese primero a un lado de la cama. Cuando se sienta bien, sostngase de algo y pngase lentamente de pie. Haga esto hasta que se sienta seguro en cuanto al equilibrio. Mueva las piernas con frecuencia si debe estar de pie en un lugar durante mucho tiempo. Mientras est de pie, contraiga y relaje los msculos de las piernas. No conduzca ni use mquinas si se siente mareado. Evite agacharse si se siente mareado. En su casa, coloque los objetos en algn lugar que pueda alcanzarlos sin agacharse. Estilo de vida No fume ni  use vapeadores o productos que tengan nicotina o tabaco. Si necesita ayuda para dejar de fumar, hable con el mdico. Intente bajar el nivel de estrs. Puede hacerlo usando mtodos como el yoga o la meditacin. Hable con el mdico si necesita ayuda. Instrucciones generales Controle sus mareos para ver si hay cambios. Use los medicamentos solamente como se lo haya indicado el mdico. Hable con el mdico si cree que la causa de sus mareos es algn medicamento que est tomando. Dgale a un amigo o a un familiar si se siente mareado. Si detectan algn cambio en su comportamiento, pdales que llamen a su mdico. Comunquese con un mdico si: Los mareos no desaparecen o tiene sntomas nuevos. Su mareo empeora. Siente que va a vomitar. Tiene problemas para escuchar. Tiene fiebre. Tiene dolor o rigidez en el cuello. Se cae o se lastima. Solicite ayuda de inmediato si: Vomita cada vez que come o bebe. Tiene heces acuosas y no puede comer ni beber. Tiene problemas para hablar, caminar, tragar o boeing, las manos o las piernas. Se siente muy dbil. Est sangrando. No piensa con claridad o tiene problemas para armar oraciones. Un amigo o un familiar pueden notar que esto ocurre. Le cambia la visin o tiene un dolor de cabeza muy intenso. Estos sntomas pueden customer service manager. Llame al 911 de inmediato. No espere a ver si los sntomas desaparecen. No conduzca por sus propios medios [employer]. Esta informacin no tiene theme park manager el consejo del mdico. Asegrese de hacerle al mdico cualquier pregunta que tenga. Document Revised: 05/13/2022 Document Reviewed: 05/13/2022 Elsevier Patient Education  2024 Elsevier Inc.    Emil Schaumann, MD  [facility] at [facility]    [1] No Known Allergies

## 2024-02-13 NOTE — Assessment & Plan Note (Signed)
 BP Readings from Last 3 Encounters:  02/13/24 122/80  02/07/24 136/84  01/20/24 127/81  Well-controlled hypertension Continue lisinopril  10 mg daily

## 2024-02-13 NOTE — Assessment & Plan Note (Signed)
 Clinically stable.  Most likely related to recent viral illness Unremarkable physical examination No neurological deficits Differential diagnosis discussed Recommend meclizine  25 mg every 6-8 hours as needed

## 2024-02-13 NOTE — Patient Instructions (Signed)
Mareos Dizziness Los mareos son un problema muy frecuente. Causan sensacin de inestabilidad o de desvanecimiento. Puede sentir que se va a desmayar. Los [employer] pueden provocarle una lesin si se tropieza o se cae. Es ms frecuente sentirse mareado si es un adulto mayor. Hay muchas cosas que pueden hacer que se sienta mareado. Esto incluye lo siguiente: Medicamentos. Deshidratacin. Esto ocurre cuando no hay suficiente agua en el cuerpo. Enfermedad. Siga estas instrucciones en su casa: Comida y bebida  Beber suficiente lquido para mantener el pis (orina) de color amarillo plido. Esto evita la deshidratacin. Trate de beber ms lquidos transparentes, como agua. No beba alcohol. Trate de limitar la cantidad de cafena que consume. Trate de limitar la cantidad de sal, tambin llamada sodio, que consume. Actividad Trate de no hacer movimientos rpidos. Prese lentamente despus de estar sentado en una silla. Sostngase hasta que se sienta bien. Por la maana, sintese primero a un lado de la cama. Cuando se sienta bien, sostngase de algo y pngase lentamente de pie. Haga esto hasta que se sienta seguro en cuanto al equilibrio. Mueva las piernas con frecuencia si debe estar de pie en un lugar durante mucho tiempo. Mientras est de pie, contraiga y relaje los msculos de las piernas. No conduzca ni use mquinas si se siente mareado. Evite agacharse si se siente mareado. En su casa, coloque los objetos en algn lugar que pueda alcanzarlos sin agacharse. Estilo de vida No fume ni use vapeadores o productos que tengan nicotina o tabaco. Si necesita ayuda para dejar de fumar, hable con el mdico. Intente bajar el nivel de estrs. Puede hacerlo usando mtodos como el yoga o la meditacin. Hable con el mdico si necesita ayuda. Instrucciones generales Controle sus mareos para ver si hay cambios. Use los medicamentos solamente como se lo haya indicado el mdico. Hable con el mdico si cree que la  causa de sus mareos es algn medicamento que est tomando. Dgale a un amigo o a un familiar si se siente mareado. Si detectan algn cambio en su comportamiento, pdales que llamen a su mdico. Comunquese con un mdico si: Los mareos no desaparecen o tiene sntomas nuevos. Su mareo empeora. Siente que va a vomitar. Tiene problemas para escuchar. Tiene fiebre. Tiene dolor o rigidez en el cuello. Se cae o se lastima. Solicite ayuda de inmediato si: Vomita cada vez que come o bebe. Tiene heces acuosas y no puede comer ni beber. Tiene problemas para hablar, caminar, tragar o [employer], las manos o las piernas. Se siente muy dbil. Est sangrando. No piensa con claridad o tiene problemas para armar oraciones. Un amigo o un familiar pueden notar que esto ocurre. Le cambia la visin o tiene un dolor de cabeza muy intenso. Estos sntomas pueden Customer service manager. Llame al 911 de inmediato. No espere a ver si los sntomas desaparecen. No conduzca por sus propios medios [employer]. Esta informacin no tiene Theme park manager el consejo del mdico. Asegrese de hacerle al mdico cualquier pregunta que tenga. Document Revised: 05/13/2022 Document Reviewed: 05/13/2022 Elsevier Patient Education  2024 [employer].

## 2024-03-06 ENCOUNTER — Ambulatory Visit (INDEPENDENT_AMBULATORY_CARE_PROVIDER_SITE_OTHER): Admitting: Sports Medicine

## 2024-03-06 VITALS — Ht 70.0 in | Wt 189.0 lb

## 2024-03-06 DIAGNOSIS — G8929 Other chronic pain: Secondary | ICD-10-CM | POA: Diagnosis not present

## 2024-03-06 DIAGNOSIS — M5116 Intervertebral disc disorders with radiculopathy, lumbar region: Secondary | ICD-10-CM | POA: Diagnosis not present

## 2024-03-06 NOTE — Patient Instructions (Addendum)
 Epidural right L5S1  Follow up 2 weeks after to discuss results   Relative rest 2-3 weeks   Gabapentin  100 mg as needed

## 2024-03-06 NOTE — Progress Notes (Signed)
 "               [facility] [address] [place] [address] Phone: [phone]   Assessment and Plan:     1. Chronic left-sided low back pain with bilateral sciatica (Primary) 2. Lumbar disc herniation with radiculopathy -Chronic with exacerbation, subsequent visit - Recurrence of low back pain now with bilateral radicular symptoms, worse on right.  Still most consistent with lumbar disc herniation and lumbar radiculopathy as seen on lumbar MRI - Patient has had significant and improvement, 80% or greater, after epidural CSI x 2 on 12/23/2023, 01/20/2024.  Elected for repeat epidural.  Recommend right sided L5-S1 epidural based on worsening right-sided radicular symptoms - Continue HEP.  Patient did not start physical therapy, though could be considered in the future - Continue gabapentin  100 to 200 mg daily as needed - Patient had no significant relief with NSAID use in the past and NSAIDs triggered gastritis, so will not continue NSAID course at this time - No significant change with OMT at previous office visit.  Elected to not repeat OMT today    Pertinent previous records reviewed include none   Follow Up: 2 weeks after epidural.  Could consider neurosurgery if no improvement or worsening of symptoms.  Could consider physical therapy if improving   Subjective:   I, Moenique Parris, am serving as a neurosurgeon for Doctor Morene Mace   Chief Complaint: Left leg pain    HPI:    10/11/2023 Patient is  40 year old male with back and leg pain. Patient states intermittent low back pain for 2 weeks. Pain has increased last 2 weeks. Tylenol  and ibu don't help.does endorse numbness and tingling. Pain radiates from low back down to back of knee    11/17/2023 Patient states he is the same. No change.   12/07/2023 Patient states he is the same   01/18/24 Today patient states epidural helped with the lower back pain. Pain down the leg is still  present thigh to calf has numbness. Right leg is compensating , he notes weakness and pain    02/07/2024 Patient states still has pain low back and glute. Pain is flared when he is at work being active   03/06/2024 Patient states he is the same . The pain keeps coming back. Pain is in the R leg, bilateral feels like he ran a marathon   Relevant Historical Information: Hypertension, chronic gastritis   Additional pertinent review of systems negative.  Current Medications[1]   Objective:     Vitals:   03/06/24 0821  Weight: 189 lb (85.7 kg)  Height: 5' 10 (1.778 m)      Body mass index is 27.12 kg/m.    Physical Exam:    Gen: Appears well, nad, nontoxic and pleasant Psych: Alert and oriented, appropriate mood and affect Neuro: sensation intact, strength is 5/5 in upper and lower extremities, muscle tone wnl Skin: no susupicious lesions or rashes   Back - Normal skin, Spine with normal alignment and no deformity.   No tenderness to vertebral process palpation.   Bilateral lumbar paraspinous muscles are mildly tender and without spasm Mild TTP gluteal musculature Straight leg raise positive bilateral Trendelenberg negative Piriformis Test negative Gait normal  Pain with lumbar extension    Electronically signed by:  [facility] 8:46 AM 03/06/24     [1]  Current Outpatient Medications:    gabapentin  (NEURONTIN ) 100 MG  capsule, Take 1 capsule (100 mg total) by mouth 2 (two) times daily., Disp: 60 capsule, Rfl: 0   lisinopril  (ZESTRIL ) 10 MG tablet, TAKE 1 TABLET(10 MG) BY MOUTH DAILY, Disp: 90 tablet, Rfl: 3   meclizine  (ANTIVERT ) 25 MG tablet, Take 1 tablet (25 mg total) by mouth 3 (three) times daily as needed for dizziness., Disp: 30 tablet, Rfl: 0   methylPREDNISolone  (MEDROL  DOSEPAK) 4 MG TBPK tablet, Take 6 tablets on day 1.  Take 5 tablets on day 2.  Take 4 tablets on day 3.  Take 3 tablets on day 4.  Take 2 tablets on day 5.   Take 1 tablet on day 6., Disp: 21 tablet, Rfl: 0   pantoprazole  (PROTONIX ) 40 MG tablet, TAKE 1 TABLET(40 MG) BY MOUTH DAILY, Disp: 60 tablet, Rfl: 1  "

## 2024-03-16 ENCOUNTER — Ambulatory Visit (INDEPENDENT_AMBULATORY_CARE_PROVIDER_SITE_OTHER): Admitting: Physical Therapy

## 2024-03-16 ENCOUNTER — Other Ambulatory Visit: Payer: Self-pay

## 2024-03-16 ENCOUNTER — Encounter: Payer: Self-pay | Admitting: Physical Therapy

## 2024-03-16 DIAGNOSIS — M6281 Muscle weakness (generalized): Secondary | ICD-10-CM | POA: Diagnosis not present

## 2024-03-16 DIAGNOSIS — M5459 Other low back pain: Secondary | ICD-10-CM | POA: Diagnosis not present

## 2024-03-16 NOTE — Patient Instructions (Signed)
 Access Code: [email or web address] URL: [email or web address] Date: 03/16/2024 Prepared by: Elaine Daring  Exercises - Supine Lower Trunk Rotation  - 1-2 x daily - 10 reps - Sidelying Open Book Thoracic Lumbar Rotation and Extension  - 1-2 x daily - 10 reps - Cat Cow  - 1-2 x daily - 10 reps - Bird Dog  - 1 x daily - 3 sets - 10 reps - Clamshell with Resistance  - 1 x daily - 3 sets - 10 reps

## 2024-03-22 ENCOUNTER — Encounter: Payer: Self-pay | Admitting: Sports Medicine

## 2024-03-22 NOTE — Discharge Instructions (Signed)

## 2024-03-23 ENCOUNTER — Inpatient Hospital Stay: Admission: RE | Admit: 2024-03-23 | Source: Ambulatory Visit

## 2024-03-23 ENCOUNTER — Other Ambulatory Visit

## 2024-03-23 DIAGNOSIS — G8929 Other chronic pain: Secondary | ICD-10-CM

## 2024-03-23 DIAGNOSIS — M5116 Intervertebral disc disorders with radiculopathy, lumbar region: Secondary | ICD-10-CM

## 2024-03-23 MED ORDER — IOPAMIDOL (ISOVUE-M 200) INJECTION 41%
1.0000 mL | Freq: Once | INTRAMUSCULAR | Status: AC
  Administered 2024-03-23: 1 mL via EPIDURAL

## 2024-03-23 MED ORDER — METHYLPREDNISOLONE ACETATE 40 MG/ML INJ SUSP (RADIOLOG
80.0000 mg | Freq: Once | INTRAMUSCULAR | Status: AC
  Administered 2024-03-23: 80 mg via EPIDURAL

## 2024-04-06 ENCOUNTER — Ambulatory Visit: Admitting: Sports Medicine

## 2024-04-06 ENCOUNTER — Encounter: Admitting: Physical Therapy

## 2024-08-07 ENCOUNTER — Encounter: Admitting: Emergency Medicine
